# Patient Record
Sex: Female | Born: 1975 | Race: White | Hispanic: No | Marital: Married | Smoking: Never smoker
Health system: Southern US, Community
[De-identification: ages and names within clinical notes are randomized; demographics above are authoritative.]

## PROBLEM LIST (undated history)

## (undated) DIAGNOSIS — F909 Attention-deficit hyperactivity disorder, unspecified type: Secondary | ICD-10-CM

## (undated) DIAGNOSIS — N3010 Interstitial cystitis (chronic) without hematuria: Secondary | ICD-10-CM

## (undated) DIAGNOSIS — C189 Malignant neoplasm of colon, unspecified: Secondary | ICD-10-CM

## (undated) DIAGNOSIS — J45909 Unspecified asthma, uncomplicated: Secondary | ICD-10-CM

## (undated) DIAGNOSIS — G43909 Migraine, unspecified, not intractable, without status migrainosus: Secondary | ICD-10-CM

## (undated) DIAGNOSIS — K859 Acute pancreatitis without necrosis or infection, unspecified: Secondary | ICD-10-CM

## (undated) DIAGNOSIS — Q899 Congenital malformation, unspecified: Secondary | ICD-10-CM

## (undated) DIAGNOSIS — Z8616 Personal history of COVID-19: Secondary | ICD-10-CM

## (undated) DIAGNOSIS — F329 Major depressive disorder, single episode, unspecified: Secondary | ICD-10-CM

## (undated) DIAGNOSIS — G629 Polyneuropathy, unspecified: Secondary | ICD-10-CM

## (undated) DIAGNOSIS — Z973 Presence of spectacles and contact lenses: Secondary | ICD-10-CM

## (undated) DIAGNOSIS — M5430 Sciatica, unspecified side: Secondary | ICD-10-CM

## (undated) DIAGNOSIS — T7840XA Allergy, unspecified, initial encounter: Secondary | ICD-10-CM

## (undated) DIAGNOSIS — F32A Depression, unspecified: Secondary | ICD-10-CM

## (undated) DIAGNOSIS — S92901A Unspecified fracture of right foot, initial encounter for closed fracture: Secondary | ICD-10-CM

## (undated) DIAGNOSIS — K219 Gastro-esophageal reflux disease without esophagitis: Secondary | ICD-10-CM

## (undated) HISTORY — DX: Malignant neoplasm of colon, unspecified: C18.9

## (undated) HISTORY — DX: Allergy, unspecified, initial encounter: T78.40XA

## (undated) HISTORY — DX: Interstitial cystitis (chronic) without hematuria: N30.10

## (undated) HISTORY — PX: TONSILLECTOMY AND ADENOIDECTOMY: SUR1326

## (undated) HISTORY — PX: COLON SURGERY: SHX602

## (undated) HISTORY — DX: Major depressive disorder, single episode, unspecified: F32.9

## (undated) HISTORY — DX: Migraine, unspecified, not intractable, without status migrainosus: G43.909

## (undated) HISTORY — DX: Depression, unspecified: F32.A

## (undated) HISTORY — PX: OTHER SURGICAL HISTORY: SHX169

## (undated) HISTORY — PX: FOOT SURGERY: SHX648

## (undated) HISTORY — DX: Unspecified asthma, uncomplicated: J45.909

---

## 1998-09-10 HISTORY — PX: UPPER GI ENDOSCOPY: SHX6162

## 2003-09-11 DIAGNOSIS — C189 Malignant neoplasm of colon, unspecified: Secondary | ICD-10-CM

## 2003-09-11 HISTORY — PX: OTHER SURGICAL HISTORY: SHX169

## 2003-09-11 HISTORY — DX: Malignant neoplasm of colon, unspecified: C18.9

## 2004-09-10 HISTORY — PX: COLON SURGERY: SHX602

## 2012-02-25 DIAGNOSIS — Z85048 Personal history of other malignant neoplasm of rectum, rectosigmoid junction, and anus: Secondary | ICD-10-CM | POA: Insufficient documentation

## 2012-02-25 DIAGNOSIS — R935 Abnormal findings on diagnostic imaging of other abdominal regions, including retroperitoneum: Secondary | ICD-10-CM | POA: Insufficient documentation

## 2012-05-03 ENCOUNTER — Ambulatory Visit (INDEPENDENT_AMBULATORY_CARE_PROVIDER_SITE_OTHER): Payer: BC Managed Care – PPO | Admitting: Emergency Medicine

## 2012-05-03 VITALS — BP 110/74 | HR 72 | Temp 97.9°F | Resp 16 | Ht 70.0 in | Wt 152.0 lb

## 2012-05-03 DIAGNOSIS — G43909 Migraine, unspecified, not intractable, without status migrainosus: Secondary | ICD-10-CM

## 2012-05-03 MED ORDER — TRAMADOL HCL 50 MG PO TABS: 50.0000 mg | ORAL_TABLET | Freq: Four times a day (QID) | ORAL | Status: DC | PRN

## 2012-05-03 MED ORDER — ESTRADIOL-NORETHINDRONE ACET 0.05-0.14 MG/DAY TD PTTW: 1.0000 | MEDICATED_PATCH | TRANSDERMAL | Status: DC

## 2012-05-03 NOTE — Progress Notes (Deleted)
  Subjective:    Patient ID: Zoe Briggs, female    DOB: Jul 29, 1976, 36 y.o.   MRN: [id number]  HPI    Review of Systems     Objective:   Physical Exam        Assessment & Plan:

## 2012-05-03 NOTE — Progress Notes (Signed)
   Date:  05/03/2012   Name:  Zoe Briggs   DOB:  [birth year]   MRN:  [id number] Gender: female  Age: 36 y.o.  PCP:  No primary provider on file.    Chief Complaint: Medication Refill and Migraine   History of Present Illness:  Zoe Briggs is a 36 y.o. pleasant patient who presents with the following:  Patient moved here from [place] and needs to refill her meds.  Has history of multiple allergies and treatment with immunotherapy and requests a referral.  Also chronic migraine history and takes a tramadol daily as suppressant.  Out of medication.  Cannot take triptans due allergy.  Denies other complaint.  There is no problem list on file for this patient.   Past Medical History  Diagnosis Date  . Cancer     Past Surgical History  Procedure Date  . Colon surgery     History  Substance Use Topics  . Smoking status: Former Smoker    Quit date: 09/10/1992  . Smokeless tobacco: Not on file  . Alcohol Use: Not on file    No family history on file.  Allergies  Allergen Reactions  . Imitrex (Sumatriptan) Rash  . Iodinated Diagnostic Agents Rash    Medication list has been reviewed and updated.  Current Outpatient Prescriptions on File Prior to Visit  Medication Sig Dispense Refill  . cetirizine (ZYRTEC) 10 MG tablet Take 10 mg by mouth daily.      . citalopram (CELEXA) 20 MG tablet Take 20 mg by mouth daily.      Marland Kitchen estradiol-norethindrone (COMBIPATCH) 0.05-0.14 MG/DAY Place 1 patch onto the skin 2 (two) times a week.  8 patch  12    Review of Systems:  As per HPI, otherwise negative.    Physical Examination: Filed Vitals:   05/03/12 1451  BP: 110/74  Pulse: 72  Temp: 97.9 F (36.6 C)  Resp: 16   Filed Vitals:   05/03/12 1451  Height: 5\' 10"  (1.778 m)  Weight: 152 lb (68.947 kg)   Body mass index is 21.81 kg/(m^2). Ideal Body Weight: Weight in (lb) to have BMI = 25: 173.9    GEN: WDWN, NAD, Non-toxic, Alert & Oriented x 3 HEENT: Atraumatic,  Normocephalic.  Ears and Nose: No external deformity. EXTR: No clubbing/cyanosis/edema NEURO: Normal gait.  PSYCH: Normally interactive. Conversant. Not depressed or anxious appearing.  Calm demeanor.    Assessment and Plan: Migraine headaches Seasonal allergic rhinitis History of Stage 4 colon cancer with colostomy. Appropriate referrals   Carmelina Dane, MD

## 2012-05-27 ENCOUNTER — Ambulatory Visit (INDEPENDENT_AMBULATORY_CARE_PROVIDER_SITE_OTHER): Payer: BC Managed Care – PPO | Admitting: Family Medicine

## 2012-05-27 ENCOUNTER — Ambulatory Visit: Payer: Self-pay | Admitting: Family Medicine

## 2012-05-27 ENCOUNTER — Encounter: Payer: Self-pay | Admitting: Family Medicine

## 2012-05-27 VITALS — BP 104/62 | HR 76 | Temp 98.3°F | Ht 70.0 in | Wt 151.8 lb

## 2012-05-27 DIAGNOSIS — J309 Allergic rhinitis, unspecified: Secondary | ICD-10-CM

## 2012-05-27 DIAGNOSIS — G43909 Migraine, unspecified, not intractable, without status migrainosus: Secondary | ICD-10-CM

## 2012-05-27 DIAGNOSIS — Z0000 Encounter for general adult medical examination without abnormal findings: Secondary | ICD-10-CM

## 2012-05-27 DIAGNOSIS — F329 Major depressive disorder, single episode, unspecified: Secondary | ICD-10-CM

## 2012-05-27 DIAGNOSIS — F3289 Other specified depressive episodes: Secondary | ICD-10-CM

## 2012-05-27 DIAGNOSIS — J302 Other seasonal allergic rhinitis: Secondary | ICD-10-CM | POA: Insufficient documentation

## 2012-05-27 DIAGNOSIS — J45909 Unspecified asthma, uncomplicated: Secondary | ICD-10-CM

## 2012-05-27 DIAGNOSIS — C189 Malignant neoplasm of colon, unspecified: Secondary | ICD-10-CM

## 2012-05-27 DIAGNOSIS — Z780 Asymptomatic menopausal state: Secondary | ICD-10-CM

## 2012-05-27 DIAGNOSIS — N950 Postmenopausal bleeding: Secondary | ICD-10-CM

## 2012-05-27 DIAGNOSIS — F32A Depression, unspecified: Secondary | ICD-10-CM | POA: Insufficient documentation

## 2012-05-27 MED ORDER — CITALOPRAM HYDROBROMIDE 20 MG PO TABS: 20.0000 mg | ORAL_TABLET | Freq: Every day | ORAL | Status: DC

## 2012-05-27 MED ORDER — TRAMADOL HCL 50 MG PO TABS: 50.0000 mg | ORAL_TABLET | Freq: Four times a day (QID) | ORAL | Status: DC | PRN

## 2012-05-27 NOTE — Assessment & Plan Note (Signed)
Pt has f/u with [facility]

## 2012-05-27 NOTE — Assessment & Plan Note (Signed)
Pt has not had problem with asthma

## 2012-05-27 NOTE — Assessment & Plan Note (Signed)
Cont meds Pt requesting allergy/asthma referral

## 2012-05-27 NOTE — Assessment & Plan Note (Signed)
Stable. Refill meds

## 2012-05-27 NOTE — Patient Instructions (Addendum)
Preventive Care for Adults, Female A healthy lifestyle and preventive care can promote health and wellness. Preventive health guidelines for women include the following key practices.  A routine yearly physical is a good way to check with your caregiver about your health and preventive screening. It is a chance to share any concerns and updates on your health, and to receive a thorough exam.   Visit your dentist for a routine exam and preventive care every 6 months. Brush your teeth twice a day and floss once a day. Good oral hygiene prevents tooth decay and gum disease.   The frequency of eye exams is based on your age, health, family medical history, use of contact lenses, and other factors. Follow your caregiver's recommendations for frequency of eye exams.   Eat a healthy diet. Foods like vegetables, fruits, whole grains, low-fat dairy products, and lean protein foods contain the nutrients you need without too many calories. Decrease your intake of foods high in solid fats, added sugars, and salt. Eat the right amount of calories for you.Get information about a proper diet from your caregiver, if necessary.   Regular physical exercise is one of the most important things you can do for your health. Most adults should get at least 150 minutes of moderate-intensity exercise (any activity that increases your heart rate and causes you to sweat) each week. In addition, most adults need muscle-strengthening exercises on 2 or more days a week.   Maintain a healthy weight. The body mass index (BMI) is a screening tool to identify possible weight problems. It provides an estimate of body fat based on height and weight. Your caregiver can help determine your BMI, and can help you achieve or maintain a healthy weight.For adults 20 years and older:   A BMI below 18.5 is considered underweight.   A BMI of 18.5 to 24.9 is normal.   A BMI of 25 to 29.9 is considered overweight.   A BMI of 30 and above is  considered obese.   Maintain normal blood lipids and cholesterol levels by exercising and minimizing your intake of saturated fat. Eat a balanced diet with plenty of fruit and vegetables. Blood tests for lipids and cholesterol should begin at age 20 and be repeated every 5 years. If your lipid or cholesterol levels are high, you are over 50, or you are at high risk for heart disease, you may need your cholesterol levels checked more frequently.Ongoing high lipid and cholesterol levels should be treated with medicines if diet and exercise are not effective.   If you smoke, find out from your caregiver how to quit. If you do not use tobacco, do not start.   If you are pregnant, do not drink alcohol. If you are breastfeeding, be very cautious about drinking alcohol. If you are not pregnant and choose to drink alcohol, do not exceed 1 drink per day. One drink is considered to be 12 ounces (355 mL) of beer, 5 ounces (148 mL) of wine, or 1.5 ounces (44 mL) of liquor.   Avoid use of street drugs. Do not share needles with anyone. Ask for help if you need support or instructions about stopping the use of drugs.   High blood pressure causes heart disease and increases the risk of stroke. Your blood pressure should be checked at least every 1 to 2 years. Ongoing high blood pressure should be treated with medicines if weight loss and exercise are not effective.   If you are 55 to 38   years old, ask your caregiver if you should take aspirin to prevent strokes.   Diabetes screening involves taking a blood sample to check your fasting blood sugar level. This should be done once every 3 years, after age 45, if you are within normal weight and without risk factors for diabetes. Testing should be considered at a younger age or be carried out more frequently if you are overweight and have at least 1 risk factor for diabetes.   Breast cancer screening is essential preventive care for women. You should practice "breast  self-awareness." This means understanding the normal appearance and feel of your breasts and may include breast self-examination. Any changes detected, no matter how small, should be reported to a caregiver. Women in their 20s and 30s should have a clinical breast exam (CBE) by a caregiver as part of a regular health exam every 1 to 3 years. After age 40, women should have a CBE every year. Starting at age 40, women should consider having a mammography (breast X-ray test) every year. Women who have a family history of breast cancer should talk to their caregiver about genetic screening. Women at a high risk of breast cancer should talk to their caregivers about having magnetic resonance imaging (MRI) and a mammography every year.   The Pap test is a screening test for cervical cancer. A Pap test can show cell changes on the cervix that might become cervical cancer if left untreated. A Pap test is a procedure in which cells are obtained and examined from the lower end of the uterus (cervix).   Women should have a Pap test starting at age 21.   Between ages 21 and 29, Pap tests should be repeated every 2 years.   Beginning at age 30, you should have a Pap test every 3 years as long as the past 3 Pap tests have been normal.   Some women have medical problems that increase the chance of getting cervical cancer. Talk to your caregiver about these problems. It is especially important to talk to your caregiver if a new problem develops soon after your last Pap test. In these cases, your caregiver may recommend more frequent screening and Pap tests.   The above recommendations are the same for women who have or have not gotten the vaccine for human papillomavirus (HPV).   If you had a hysterectomy for a problem that was not cancer or a condition that could lead to cancer, then you no longer need Pap tests. Even if you no longer need a Pap test, a regular exam is a good idea to make sure no other problems are  starting.   If you are between ages 65 and 70, and you have had normal Pap tests going back 10 years, you no longer need Pap tests. Even if you no longer need a Pap test, a regular exam is a good idea to make sure no other problems are starting.   If you have had past treatment for cervical cancer or a condition that could lead to cancer, you need Pap tests and screening for cancer for at least 20 years after your treatment.   If Pap tests have been discontinued, risk factors (such as a new sexual partner) need to be reassessed to determine if screening should be resumed.   The HPV test is an additional test that may be used for cervical cancer screening. The HPV test looks for the virus that can cause the cell changes on the cervix.   The cells collected during the Pap test can be tested for HPV. The HPV test could be used to screen women aged 30 years and older, and should be used in women of any age who have unclear Pap test results. After the age of 30, women should have HPV testing at the same frequency as a Pap test.   Colorectal cancer can be detected and often prevented. Most routine colorectal cancer screening begins at the age of 50 and continues through age 75. However, your caregiver may recommend screening at an earlier age if you have risk factors for colon cancer. On a yearly basis, your caregiver may provide home test kits to check for hidden blood in the stool. Use of a small camera at the end of a tube, to directly examine the colon (sigmoidoscopy or colonoscopy), can detect the earliest forms of colorectal cancer. Talk to your caregiver about this at age 50, when routine screening begins. Direct examination of the colon should be repeated every 5 to 10 years through age 75, unless early forms of pre-cancerous polyps or small growths are found.   Hepatitis C blood testing is recommended for all people born from 1945 through 1965 and any individual with known risks for hepatitis C.    Practice safe sex. Use condoms and avoid high-risk sexual practices to reduce the spread of sexually transmitted infections (STIs). STIs include gonorrhea, chlamydia, syphilis, trichomonas, herpes, HPV, and human immunodeficiency virus (HIV). Herpes, HIV, and HPV are viral illnesses that have no cure. They can result in disability, cancer, and death. Sexually active women aged 25 and younger should be checked for chlamydia. Older women with new or multiple partners should also be tested for chlamydia. Testing for other STIs is recommended if you are sexually active and at increased risk.   Osteoporosis is a disease in which the bones lose minerals and strength with aging. This can result in serious bone fractures. The risk of osteoporosis can be identified using a bone density scan. Women ages 65 and over and women at risk for fractures or osteoporosis should discuss screening with their caregivers. Ask your caregiver whether you should take a calcium supplement or vitamin D to reduce the rate of osteoporosis.   Menopause can be associated with physical symptoms and risks. Hormone replacement therapy is available to decrease symptoms and risks. You should talk to your caregiver about whether hormone replacement therapy is right for you.   Use sunscreen with sun protection factor (SPF) of 30 or more. Apply sunscreen liberally and repeatedly throughout the day. You should seek shade when your shadow is shorter than you. Protect yourself by wearing long sleeves, pants, a wide-brimmed hat, and sunglasses year round, whenever you are outdoors.   Once a month, do a whole body skin exam, using a mirror to look at the skin on your back. Notify your caregiver of new moles, moles that have irregular borders, moles that are larger than a pencil eraser, or moles that have changed in shape or color.   Stay current with required immunizations.   Influenza. You need a dose every fall (or winter). The composition of  the flu vaccine changes each year, so being vaccinated once is not enough.   Pneumococcal polysaccharide. You need 1 to 2 doses if you smoke cigarettes or if you have certain chronic medical conditions. You need 1 dose at age 65 (or older) if you have never been vaccinated.   Tetanus, diphtheria, pertussis (Tdap, Td). Get 1 dose of   Tdap vaccine if you are younger than age 65, are over 65 and have contact with an infant, are a healthcare worker, are pregnant, or simply want to be protected from whooping cough. After that, you need a Td booster dose every 10 years. Consult your caregiver if you have not had at least 3 tetanus and diphtheria-containing shots sometime in your life or have a deep or dirty wound.   HPV. You need this vaccine if you are a woman age 26 or younger. The vaccine is given in 3 doses over 6 months.   Measles, mumps, rubella (MMR). You need at least 1 dose of MMR if you were born in 1957 or later. You may also need a second dose.   Meningococcal. If you are age 19 to 21 and a first-year college student living in a residence hall, or have one of several medical conditions, you need to get vaccinated against meningococcal disease. You may also need additional booster doses.   Zoster (shingles). If you are age 60 or older, you should get this vaccine.   Varicella (chickenpox). If you have never had chickenpox or you were vaccinated but received only 1 dose, talk to your caregiver to find out if you need this vaccine.   Hepatitis A. You need this vaccine if you have a specific risk factor for hepatitis A virus infection or you simply wish to be protected from this disease. The vaccine is usually given as 2 doses, 6 to 18 months apart.   Hepatitis B. You need this vaccine if you have a specific risk factor for hepatitis B virus infection or you simply wish to be protected from this disease. The vaccine is given in 3 doses, usually over 6 months.  Preventive Services /  Frequency Ages 19 to 39  Blood pressure check.** / Every 1 to 2 years.   Lipid and cholesterol check.** / Every 5 years beginning at age 20.   Clinical breast exam.** / Every 3 years for women in their 20s and 30s.   Pap test.** / Every 2 years from ages 21 through 29. Every 3 years starting at age 30 through age 65 or 70 with a history of 3 consecutive normal Pap tests.   HPV screening.** / Every 3 years from ages 30 through ages 65 to 70 with a history of 3 consecutive normal Pap tests.   Hepatitis C blood test.** / For any individual with known risks for hepatitis C.   Skin self-exam. / Monthly.   Influenza immunization.** / Every year.   Pneumococcal polysaccharide immunization.** / 1 to 2 doses if you smoke cigarettes or if you have certain chronic medical conditions.   Tetanus, diphtheria, pertussis (Tdap, Td) immunization. / A one-time dose of Tdap vaccine. After that, you need a Td booster dose every 10 years.   HPV immunization. / 3 doses over 6 months, if you are 26 and younger.   Measles, mumps, rubella (MMR) immunization. / You need at least 1 dose of MMR if you were born in 1957 or later. You may also need a second dose.   Meningococcal immunization. / 1 dose if you are age 19 to 21 and a first-year college student living in a residence hall, or have one of several medical conditions, you need to get vaccinated against meningococcal disease. You may also need additional booster doses.   Varicella immunization.** / Consult your caregiver.   Hepatitis A immunization.** / Consult your caregiver. 2 doses, 6 to 18 months   apart.   Hepatitis B immunization.** / Consult your caregiver. 3 doses usually over 6 months.  Ages 40 to 64  Blood pressure check.** / Every 1 to 2 years.   Lipid and cholesterol check.** / Every 5 years beginning at age 20.   Clinical breast exam.** / Every year after age 40.   Mammogram.** / Every year beginning at age 40 and continuing for as  long as you are in good health. Consult with your caregiver.   Pap test.** / Every 3 years starting at age 30 through age 65 or 70 with a history of 3 consecutive normal Pap tests.   HPV screening.** / Every 3 years from ages 30 through ages 65 to 70 with a history of 3 consecutive normal Pap tests.   Fecal occult blood test (FOBT) of stool. / Every year beginning at age 50 and continuing until age 75. You may not need to do this test if you get a colonoscopy every 10 years.   Flexible sigmoidoscopy or colonoscopy.** / Every 5 years for a flexible sigmoidoscopy or every 10 years for a colonoscopy beginning at age 50 and continuing until age 75.   Hepatitis C blood test.** / For all people born from 1945 through 1965 and any individual with known risks for hepatitis C.   Skin self-exam. / Monthly.   Influenza immunization.** / Every year.   Pneumococcal polysaccharide immunization.** / 1 to 2 doses if you smoke cigarettes or if you have certain chronic medical conditions.   Tetanus, diphtheria, pertussis (Tdap, Td) immunization.** / A one-time dose of Tdap vaccine. After that, you need a Td booster dose every 10 years.   Measles, mumps, rubella (MMR) immunization. / You need at least 1 dose of MMR if you were born in 1957 or later. You may also need a second dose.   Varicella immunization.** / Consult your caregiver.   Meningococcal immunization.** / Consult your caregiver.   Hepatitis A immunization.** / Consult your caregiver. 2 doses, 6 to 18 months apart.   Hepatitis B immunization.** / Consult your caregiver. 3 doses, usually over 6 months.  Ages 65 and over  Blood pressure check.** / Every 1 to 2 years.   Lipid and cholesterol check.** / Every 5 years beginning at age 20.   Clinical breast exam.** / Every year after age 40.   Mammogram.** / Every year beginning at age 40 and continuing for as long as you are in good health. Consult with your caregiver.   Pap test.** /  Every 3 years starting at age 30 through age 65 or 70 with a 3 consecutive normal Pap tests. Testing can be stopped between 65 and 70 with 3 consecutive normal Pap tests and no abnormal Pap or HPV tests in the past 10 years.   HPV screening.** / Every 3 years from ages 30 through ages 65 or 70 with a history of 3 consecutive normal Pap tests. Testing can be stopped between 65 and 70 with 3 consecutive normal Pap tests and no abnormal Pap or HPV tests in the past 10 years.   Fecal occult blood test (FOBT) of stool. / Every year beginning at age 50 and continuing until age 75. You may not need to do this test if you get a colonoscopy every 10 years.   Flexible sigmoidoscopy or colonoscopy.** / Every 5 years for a flexible sigmoidoscopy or every 10 years for a colonoscopy beginning at age 50 and continuing until age 75.   Hepatitis   C blood test.** / For all people born from 1945 through 1965 and any individual with known risks for hepatitis C.   Osteoporosis screening.** / A one-time screening for women ages 65 and over and women at risk for fractures or osteoporosis.   Skin self-exam. / Monthly.   Influenza immunization.** / Every year.   Pneumococcal polysaccharide immunization.** / 1 dose at age 65 (or older) if you have never been vaccinated.   Tetanus, diphtheria, pertussis (Tdap, Td) immunization. / A one-time dose of Tdap vaccine if you are over 65 and have contact with an infant, are a healthcare worker, or simply want to be protected from whooping cough. After that, you need a Td booster dose every 10 years.   Varicella immunization.** / Consult your caregiver.   Meningococcal immunization.** / Consult your caregiver.   Hepatitis A immunization.** / Consult your caregiver. 2 doses, 6 to 18 months apart.   Hepatitis B immunization.** / Check with your caregiver. 3 doses, usually over 6 months.  ** Family history and personal history of risk and conditions may change your caregiver's  recommendations. Document Released: 10/23/2001 Document Revised: 08/16/2011 Document Reviewed: 01/22/2011 [employer] Patient Information 2012 [employer]. 

## 2012-05-27 NOTE — Progress Notes (Signed)
  Subjective:     Zoe Briggs is a 36 y.o. female and is here for a comprehensive physical exam. The patient reports no problems.  History   Social History  . Marital Status: Married    Spouse Name: N/A    Number of Children: 1  . Years of Education: N/A   Occupational History  . [employer]     Social History Main Topics  . Smoking status: Never Smoker   . Smokeless tobacco: Never Used  . Alcohol Use: Yes     Occ  . Drug Use: No  . Sexually Active: Yes -- Female partner(s)   Other Topics Concern  . Not on file   Social History Narrative   Exercise-- 4 days a week   Health Maintenance  Topic Date Due  . Pap Smear  09/11/2011  . Influenza Vaccine  05/27/2012  . Tetanus/tdap  09/10/2014    The following portions of the patient's history were reviewed and updated as appropriate: allergies, current medications, past family history, past medical history, past social history, past surgical history and problem list.  Review of Systems Review of Systems  Constitutional: Negative for activity change, appetite change and fatigue.  HENT: Negative for hearing loss, congestion, tinnitus and ear discharge.  dentist q78m Eyes: Negative for visual disturbance (see optho q1y -- vision corrected to 20/20 with glasses).  Respiratory: Negative for cough, chest tightness and shortness of breath.   Cardiovascular: Negative for chest pain, palpitations and leg swelling.  Gastrointestinal: Negative for abdominal pain, diarrhea, constipation and abdominal distention.  Genitourinary: Negative for urgency, frequency, decreased urine volume and difficulty urinating.  Musculoskeletal: Negative for back pain, arthralgias and gait problem.  Skin: Negative for color change, pallor and rash.  Neurological: Negative for dizziness, light-headedness, numbness and headaches.  Hematological: Negative for adenopathy. Does not bruise/bleed easily.  Psychiatric/Behavioral: Negative for suicidal ideas,  confusion, sleep disturbance, self-injury, dysphoric mood, decreased concentration and agitation.       Objective:    BP 104/62  Pulse 76  Temp 98.3 F (36.8 C) (Oral)  Ht 5\' 10"  (1.778 m)  Wt 151 lb 12.8 oz (68.856 kg)  BMI 21.78 kg/m2  SpO2 97% General appearance: alert, cooperative, appears stated age and no distress Head: Normocephalic, without obvious abnormality, atraumatic Eyes: conjunctivae/corneas clear. PERRL, EOM's intact. Fundi benign. Ears: normal TM's and external ear canals both ears Nose: Nares normal. Septum midline. Mucosa normal. No drainage or sinus tenderness. Throat: lips, mucosa, and tongue normal; teeth and gums normal Neck: no adenopathy, no carotid bruit, supple, symmetrical, trachea midline and thyroid not enlarged, symmetric, no tenderness/mass/nodules Back: symmetric, no curvature. ROM normal. No CVA tenderness. Lungs: clear to auscultation bilaterally Breasts: normal appearance, no masses or tenderness Heart: regular rate and rhythm, S1, S2 normal, no murmur, click, rub or gallop Abdomen: soft, non-tender; bowel sounds normal; no masses,  no organomegaly Pelvic: deferred--gyn Extremities: extremities normal, atraumatic, no cyanosis or edema Pulses: 2+ and symmetric Skin: Skin color, texture, turgor normal. No rashes or lesions Lymph nodes: Cervical, supraclavicular, and axillary nodes normal. Neurologic: Alert and oriented X 3, normal strength and tone. Normal symmetric reflexes. Normal coordination and gait    Assessment:    Healthy female exam.      Plan:  ghm utd Check labs   See After Visit Summary for Counseling Recommendations

## 2012-05-30 ENCOUNTER — Other Ambulatory Visit: Payer: BC Managed Care – PPO

## 2012-06-02 ENCOUNTER — Other Ambulatory Visit: Payer: BC Managed Care – PPO

## 2012-06-03 ENCOUNTER — Other Ambulatory Visit (INDEPENDENT_AMBULATORY_CARE_PROVIDER_SITE_OTHER): Payer: BC Managed Care – PPO

## 2012-06-03 DIAGNOSIS — Z780 Asymptomatic menopausal state: Secondary | ICD-10-CM

## 2012-06-03 DIAGNOSIS — Z0000 Encounter for general adult medical examination without abnormal findings: Secondary | ICD-10-CM

## 2012-06-03 DIAGNOSIS — Z13220 Encounter for screening for lipoid disorders: Secondary | ICD-10-CM

## 2012-06-03 LAB — URINALYSIS
Bilirubin Urine: NEGATIVE
Hgb urine dipstick: NEGATIVE
Ketones, ur: NEGATIVE
Leukocytes, UA: NEGATIVE
Nitrite: NEGATIVE
Specific Gravity, Urine: <=1.005 (ref 1.000–1.030)
Total Protein, Urine: NEGATIVE
Urine Glucose: NEGATIVE
Urobilinogen, UA: 0.2 (ref 0.0–1.0)
pH: 7 (ref 5.0–8.0)

## 2012-06-03 LAB — CBC WITH DIFFERENTIAL/PLATELET
Basophils Absolute: 0 10*3/uL (ref 0.0–0.1)
Basophils Relative: 0.3 % (ref 0.0–3.0)
Eosinophils Absolute: 0 10*3/uL (ref 0.0–0.7)
Eosinophils Relative: 0.7 % (ref 0.0–5.0)
HCT: 40.9 % (ref 36.0–46.0)
Hemoglobin: 13.4 g/dL (ref 12.0–15.0)
Lymphocytes Relative: 26.8 % (ref 12.0–46.0)
Lymphs Abs: 0.9 10*3/uL (ref 0.7–4.0)
MCHC: 32.7 g/dL (ref 30.0–36.0)
MCV: 96.4 fl (ref 78.0–100.0)
Monocytes Absolute: 0.3 10*3/uL (ref 0.1–1.0)
Monocytes Relative: 9.1 % (ref 3.0–12.0)
Neutro Abs: 2.1 10*3/uL (ref 1.4–7.7)
Neutrophils Relative %: 63.1 % (ref 43.0–77.0)
Platelets: 155 10*3/uL (ref 150.0–400.0)
RBC: 4.24 Mil/uL (ref 3.87–5.11)
RDW: 12.4 % (ref 11.5–14.6)
WBC: 3.3 10*3/uL — ABNORMAL LOW (ref 4.5–10.5)

## 2012-06-03 NOTE — Progress Notes (Signed)
Labs only

## 2012-06-04 LAB — BASIC METABOLIC PANEL
BUN: 11 mg/dL (ref 6–23)
CO2: 26 mEq/L (ref 19–32)
Calcium: 9 mg/dL (ref 8.4–10.5)
Chloride: 106 mEq/L (ref 96–112)
Creatinine, Ser: 0.6 mg/dL (ref 0.4–1.2)
GFR: 110.44 mL/min (ref >60.00)
Glucose, Bld: 89 mg/dL (ref 70–99)
Potassium: 4.2 mEq/L (ref 3.5–5.1)
Sodium: 138 mEq/L (ref 135–145)

## 2012-06-04 LAB — HEPATIC FUNCTION PANEL
ALT: 15 U/L (ref 0–35)
AST: 19 U/L (ref 0–37)
Albumin: 4.2 g/dL (ref 3.5–5.2)
Alkaline Phosphatase: 50 U/L (ref 39–117)
Bilirubin, Direct: 0.1 mg/dL (ref 0.0–0.3)
Total Bilirubin: 0.6 mg/dL (ref 0.3–1.2)
Total Protein: 7 g/dL (ref 6.0–8.3)

## 2012-06-04 LAB — LIPID PANEL
Cholesterol: 203 mg/dL — ABNORMAL HIGH (ref 0–200)
HDL: 78.7 mg/dL (ref >39.00)
Total CHOL/HDL Ratio: 3
Triglycerides: 75 mg/dL (ref 0.0–149.0)
VLDL: 15 mg/dL (ref 0.0–40.0)

## 2012-06-04 LAB — LDL CHOLESTEROL, DIRECT: Direct LDL: 99.9 mg/dL

## 2012-06-04 LAB — TSH: TSH: 0.49 u[IU]/mL (ref 0.35–5.50)

## 2012-06-06 LAB — VITAMIN D 1,25 DIHYDROXY
Vitamin D 1, 25 (OH)2 Total: 54 pg/mL (ref 18–72)
Vitamin D2 1, 25 (OH)2: <8 pg/mL
Vitamin D3 1, 25 (OH)2: 54 pg/mL

## 2012-06-17 ENCOUNTER — Telehealth: Payer: Self-pay | Admitting: Family Medicine

## 2012-06-17 NOTE — Telephone Encounter (Signed)
msg left on VM for clarification.       KP

## 2012-06-17 NOTE — Telephone Encounter (Signed)
Pt called in and needs med to allow her to work- she would like you to call her back. She is going to work and asked that if she doesn't pick up to please leave message.

## 2012-06-18 ENCOUNTER — Other Ambulatory Visit: Payer: Self-pay | Admitting: Family Medicine

## 2012-06-18 DIAGNOSIS — Z889 Allergy status to unspecified drugs, medicaments and biological substances status: Secondary | ICD-10-CM

## 2012-06-18 MED ORDER — PREDNISONE 10 MG PO TABS: ORAL_TABLET | ORAL | Status: DC

## 2012-06-18 NOTE — Telephone Encounter (Signed)
Spoke with Dr.Lowne and she said she was seeing an allergist that we referred her to Next Wednesday, They are requesting the patient have an Rx for prednisone to replace antihistamine. I was told by Dr.Vanwinkles office the antihistamne will not affect testing. Please advise     KP

## 2012-06-18 NOTE — Telephone Encounter (Signed)
You mean the prednisone won't affect it , right---I know they are supposed to be off antihistamines for 1 week before.   Pred 10 mg taper sent to pharm on file.

## 2012-06-19 NOTE — Telephone Encounter (Signed)
VM left advising the patient Rx sent to the pharmacy.      KP

## 2012-06-25 ENCOUNTER — Encounter (HOSPITAL_COMMUNITY): Payer: Self-pay | Admitting: *Deleted

## 2012-06-25 ENCOUNTER — Emergency Department (HOSPITAL_COMMUNITY)
Admission: EM | Admit: 2012-06-25 | Discharge: 2012-06-25 | Disposition: A | Discharge status: Home / Self Care | Payer: BC Managed Care – PPO | Attending: Emergency Medicine | Admitting: Emergency Medicine

## 2012-06-25 ENCOUNTER — Emergency Department (HOSPITAL_COMMUNITY): Payer: BC Managed Care – PPO

## 2012-06-25 DIAGNOSIS — F411 Generalized anxiety disorder: Secondary | ICD-10-CM | POA: Insufficient documentation

## 2012-06-25 DIAGNOSIS — Z933 Colostomy status: Secondary | ICD-10-CM | POA: Insufficient documentation

## 2012-06-25 DIAGNOSIS — N390 Urinary tract infection, site not specified: Secondary | ICD-10-CM | POA: Insufficient documentation

## 2012-06-25 DIAGNOSIS — R109 Unspecified abdominal pain: Secondary | ICD-10-CM

## 2012-06-25 DIAGNOSIS — Z85038 Personal history of other malignant neoplasm of large intestine: Secondary | ICD-10-CM | POA: Insufficient documentation

## 2012-06-25 LAB — URINALYSIS, ROUTINE W REFLEX MICROSCOPIC
Bilirubin Urine: NEGATIVE
Glucose, UA: NEGATIVE mg/dL
Ketones, ur: NEGATIVE mg/dL
Nitrite: NEGATIVE
Protein, ur: 30 mg/dL — AB
Specific Gravity, Urine: 1.02 (ref 1.005–1.030)
Urobilinogen, UA: 0.2 mg/dL (ref 0.0–1.0)
pH: 5.5 (ref 5.0–8.0)

## 2012-06-25 LAB — CBC WITH DIFFERENTIAL/PLATELET
Basophils Absolute: 0 10*3/uL (ref 0.0–0.1)
Basophils Relative: 0 % (ref 0–1)
Eosinophils Absolute: 0 10*3/uL (ref 0.0–0.7)
Eosinophils Relative: 0 % (ref 0–5)
HCT: 37.3 % (ref 36.0–46.0)
Hemoglobin: 12.9 g/dL (ref 12.0–15.0)
Lymphocytes Relative: 10 % — ABNORMAL LOW (ref 12–46)
Lymphs Abs: 0.9 10*3/uL (ref 0.7–4.0)
MCH: 31.9 pg (ref 26.0–34.0)
MCHC: 34.6 g/dL (ref 30.0–36.0)
MCV: 92.1 fL (ref 78.0–100.0)
Monocytes Absolute: 0.8 10*3/uL (ref 0.1–1.0)
Monocytes Relative: 9 % (ref 3–12)
Neutro Abs: 7.1 10*3/uL (ref 1.7–7.7)
Neutrophils Relative %: 80 % — ABNORMAL HIGH (ref 43–77)
Platelets: 195 10*3/uL (ref 150–400)
RBC: 4.05 MIL/uL (ref 3.87–5.11)
RDW: 12.1 % (ref 11.5–15.5)
WBC: 8.8 10*3/uL (ref 4.0–10.5)

## 2012-06-25 LAB — BASIC METABOLIC PANEL
BUN: 22 mg/dL (ref 6–23)
CO2: 24 mEq/L (ref 19–32)
Calcium: 9.6 mg/dL (ref 8.4–10.5)
Chloride: 102 mEq/L (ref 96–112)
Creatinine, Ser: 0.74 mg/dL (ref 0.50–1.10)
GFR calc Af Amer: >90 mL/min (ref >90)
GFR calc non Af Amer: >90 mL/min (ref >90)
Glucose, Bld: 119 mg/dL — ABNORMAL HIGH (ref 70–99)
Potassium: 3.8 mEq/L (ref 3.5–5.1)
Sodium: 136 mEq/L (ref 135–145)

## 2012-06-25 LAB — PREGNANCY, URINE: Preg Test, Ur: NEGATIVE

## 2012-06-25 LAB — URINE MICROSCOPIC-ADD ON

## 2012-06-25 MED ORDER — HYDROMORPHONE HCL PF 1 MG/ML IJ SOLN
0.5000 mg | Freq: Once | INTRAMUSCULAR | Status: AC
  Administered 2012-06-25: 0.5 mg via INTRAVENOUS
  Filled 2012-06-25: qty 1

## 2012-06-25 MED ORDER — HYDROMORPHONE HCL PF 1 MG/ML IJ SOLN
1.0000 mg | Freq: Once | INTRAMUSCULAR | Status: AC
  Administered 2012-06-25: 1 mg via INTRAVENOUS
  Filled 2012-06-25: qty 1

## 2012-06-25 MED ORDER — FLUCONAZOLE 200 MG PO TABS: 200.0000 mg | ORAL_TABLET | Freq: Every day | ORAL | Status: AC

## 2012-06-25 MED ORDER — METHYLPREDNISOLONE SODIUM SUCC 40 MG IJ SOLR
40.0000 mg | Freq: Once | INTRAMUSCULAR | Status: AC
  Administered 2012-06-25: 40 mg via INTRAVENOUS
  Filled 2012-06-25: qty 1

## 2012-06-25 MED ORDER — ONDANSETRON 4 MG PO TBDP: 4.0000 mg | ORAL_TABLET | Freq: Three times a day (TID) | ORAL | Status: DC | PRN

## 2012-06-25 MED ORDER — SODIUM CHLORIDE 0.9 % IV BOLUS (SEPSIS)
1000.0000 mL | Freq: Once | INTRAVENOUS | Status: AC
  Administered 2012-06-25: 1000 mL via INTRAVENOUS

## 2012-06-25 MED ORDER — SULFAMETHOXAZOLE-TRIMETHOPRIM 800-160 MG PO TABS: 1.0000 | ORAL_TABLET | Freq: Two times a day (BID) | ORAL | Status: DC

## 2012-06-25 MED ORDER — DIPHENHYDRAMINE HCL 50 MG/ML IJ SOLN
50.0000 mg | Freq: Once | INTRAMUSCULAR | Status: AC
  Administered 2012-06-25: 50 mg via INTRAVENOUS
  Filled 2012-06-25: qty 1

## 2012-06-25 MED ORDER — IOHEXOL 300 MG/ML  SOLN
100.0000 mL | Freq: Once | INTRAMUSCULAR | Status: AC | PRN
  Administered 2012-06-25: 100 mL via INTRAVENOUS

## 2012-06-25 MED ORDER — ONDANSETRON HCL 4 MG/2ML IJ SOLN
4.0000 mg | Freq: Once | INTRAMUSCULAR | Status: AC
  Administered 2012-06-25: 4 mg via INTRAVENOUS
  Filled 2012-06-25: qty 2

## 2012-06-25 MED ORDER — DEXTROSE 5 % IV SOLN
1.0000 g | Freq: Once | INTRAVENOUS | Status: AC
  Administered 2012-06-25: 1 g via INTRAVENOUS
  Filled 2012-06-25: qty 10

## 2012-06-25 NOTE — Discharge Instructions (Signed)
Abdominal Pain  Abdominal pain can be caused by many things. Your caregiver decides the seriousness of your pain by an examination and possibly blood tests and X-rays. Many cases can be observed and treated at home. Most abdominal pain is not caused by a disease and will probably improve without treatment. However, in many cases, more time must pass before a clear cause of the pain can be found. Before that point, it may not be known if you need more testing, or if hospitalization or surgery is needed.  HOME CARE INSTRUCTIONS    Do not take laxatives unless directed by your caregiver.   Take pain medicine only as directed by your caregiver.   Only take over-the-counter or prescription medicines for pain, discomfort, or fever as directed by your caregiver.   Try a clear liquid diet (broth, tea, or water) for as long as directed by your caregiver. Slowly move to a bland diet as tolerated.  SEEK IMMEDIATE MEDICAL CARE IF:    The pain does not go away.   You have a fever.   You keep throwing up (vomiting).   The pain is felt only in portions of the abdomen. Pain in the right side could possibly be appendicitis. In an adult, pain in the left lower portion of the abdomen could be colitis or diverticulitis.   You pass bloody or black tarry stools.  MAKE SURE YOU:    Understand these instructions.   Will watch your condition.   Will get help right away if you are not doing well or get worse.  Document Released: 06/06/2005 Document Revised: 11/19/2011 Document Reviewed: 04/14/2008  ExitCare Patient Information 2013 [employer]  Urinary Tract Infection  Urinary tract infections (UTIs) can develop anywhere along your urinary tract. Your urinary tract is your body's drainage system for removing wastes and extra water. Your urinary tract includes two kidneys, two ureters, a bladder, and a urethra. Your kidneys are a pair of bean-shaped organs. Each kidney is about the size of your fist. They are located below  your ribs, one on each side of your spine.  CAUSES  Infections are caused by microbes, which are microscopic organisms, including fungi, viruses, and bacteria. These organisms are so small that they can only be seen through a microscope. Bacteria are the microbes that most commonly cause UTIs.  SYMPTOMS   Symptoms of UTIs may vary by age and gender of the patient and by the location of the infection. Symptoms in young women typically include a frequent and intense urge to urinate and a painful, burning feeling in the bladder or urethra during urination. Older women and men are more likely to be tired, shaky, and weak and have muscle aches and abdominal pain. A fever may mean the infection is in your kidneys. Other symptoms of a kidney infection include pain in your back or sides below the ribs, nausea, and vomiting.  DIAGNOSIS  To diagnose a UTI, your caregiver will ask you about your symptoms. Your caregiver also will ask to provide a urine sample. The urine sample will be tested for bacteria and white blood cells. White blood cells are made by your body to help fight infection.  TREATMENT   Typically, UTIs can be treated with medication. Because most UTIs are caused by a bacterial infection, they usually can be treated with the use of antibiotics. The choice of antibiotic and length of treatment depend on your symptoms and the type of bacteria causing your infection.  HOME CARE INSTRUCTIONS     If you were prescribed antibiotics, take them exactly as your caregiver instructs you. Finish the medication even if you feel better after you have only taken some of the medication.   Drink enough water and fluids to keep your urine clear or pale yellow.   Avoid caffeine, tea, and carbonated beverages. They tend to irritate your bladder.   Empty your bladder often. Avoid holding urine for long periods of time.   Empty your bladder before and after sexual intercourse.   After a bowel movement, women should cleanse from  front to back. Use each tissue only once.  SEEK MEDICAL CARE IF:    You have back pain.   You develop a fever.   Your symptoms do not begin to resolve within 3 days.  SEEK IMMEDIATE MEDICAL CARE IF:    You have severe back pain or lower abdominal pain.   You develop chills.   You have nausea or vomiting.   You have continued burning or discomfort with urination.  MAKE SURE YOU:    Understand these instructions.   Will watch your condition.   Will get help right away if you are not doing well or get worse.  Document Released: 06/06/2005 Document Revised: 02/26/2012 Document Reviewed: 10/05/2011  Exit[employer] Patient Information 2013 [employer]

## 2012-06-25 NOTE — ED Notes (Signed)
Pt c/o bilateral flank pain starting at midnight; lower abd pain; vomiting on arrival

## 2012-06-25 NOTE — ED Provider Notes (Signed)
Results for orders placed during the hospital encounter of 06/25/12  CBC WITH DIFFERENTIAL      Component Value Range   WBC 8.8  4.0 - 10.5 K/uL   RBC 4.05  3.87 - 5.11 MIL/uL   Hemoglobin 12.9  12.0 - 15.0 g/dL   HCT 16.1  09.6 - 04.5 %   MCV 92.1  78.0 - 100.0 fL   MCH 31.9  26.0 - 34.0 pg   MCHC 34.6  30.0 - 36.0 g/dL   RDW 40.9  81.1 - 91.4 %   Platelets 195  150 - 400 K/uL   Neutrophils Relative 80 (*) 43 - 77 %   Neutro Abs 7.1  1.7 - 7.7 K/uL   Lymphocytes Relative 10 (*) 12 - 46 %   Lymphs Abs 0.9  0.7 - 4.0 K/uL   Monocytes Relative 9  3 - 12 %   Monocytes Absolute 0.8  0.1 - 1.0 K/uL   Eosinophils Relative 0  0 - 5 %   Eosinophils Absolute 0.0  0.0 - 0.7 K/uL   Basophils Relative 0  0 - 1 %   Basophils Absolute 0.0  0.0 - 0.1 K/uL  BASIC METABOLIC PANEL      Component Value Range   Sodium 136  135 - 145 mEq/L   Potassium 3.8  3.5 - 5.1 mEq/L   Chloride 102  96 - 112 mEq/L   CO2 24  19 - 32 mEq/L   Glucose, Bld 119 (*) 70 - 99 mg/dL   BUN 22  6 - 23 mg/dL   Creatinine, Ser 7.82  0.50 - 1.10 mg/dL   Calcium 9.6  8.4 - 95.6 mg/dL   GFR calc non Af Amer >90  >90 mL/min   GFR calc Af Amer >90  >90 mL/min  URINALYSIS, ROUTINE W REFLEX MICROSCOPIC      Component Value Range   Color, Urine YELLOW  YELLOW   APPearance CLOUDY (*) CLEAR   Specific Gravity, Urine 1.020  1.005 - 1.030   pH 5.5  5.0 - 8.0   Glucose, UA NEGATIVE  NEGATIVE mg/dL   Hgb urine dipstick SMALL (*) NEGATIVE   Bilirubin Urine NEGATIVE  NEGATIVE   Ketones, ur NEGATIVE  NEGATIVE mg/dL   Protein, ur 30 (*) NEGATIVE mg/dL   Urobilinogen, UA 0.2  0.0 - 1.0 mg/dL   Nitrite NEGATIVE  NEGATIVE   Leukocytes, UA LARGE (*) NEGATIVE  PREGNANCY, URINE      Component Value Range   Preg Test, Ur NEGATIVE  NEGATIVE  URINE MICROSCOPIC-ADD ON      Component Value Range   Squamous Epithelial / LPF RARE  RARE   WBC, UA 21-50  <3 WBC/hpf   RBC / HPF 3-6  <3 RBC/hpf   Bacteria, UA MANY (*) RARE   Urine-Other  LESS THAN 10 mL OF URINE SUBMITTED     Ct Abdomen Pelvis W Contrast  06/25/2012  *RADIOLOGY REPORT*  Clinical Data: Bilateral flank/lower abdominal pain, nausea/vomiting, history of colon cancer status post resection and chemotherapy in 2006, history of interstitial cystitis  CT ABDOMEN AND PELVIS WITH CONTRAST  Technique:  Multidetector CT imaging of the abdomen and pelvis was performed following the standard protocol during bolus administration of intravenous contrast.  Contrast: OMNIPAQUE IOHEXOL 300 MG/ML  SOLN  Comparison: None.  Findings: Lung bases are clear.  Liver, spleen, pancreas, and adrenal glands are within normal limits.  Gallbladder is unremarkable.  No intrahepatic or extrahepatic ductal dilatation.  Kidneys are within normal limits.  No hydronephrosis.  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  No evidence of bowel obstruction.  Normal appendix.  No evidence of abdominal aortic aneurysm.  No abdominopelvic ascites.  No suspicious abdominopelvic lymphadenopathy.  Uterus and bilateral ovaries are unremarkable.  The bladder is within normal limits.  Visualized osseous structures are within normal limits.  IMPRESSION: No evidence of bowel obstruction.  Normal appendix.  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  No CT findings to account for the patient's abdominal pain.   Original Report Authenticated By: Charline Bills, M.D.     Pt's pain is much improved.  Still mildly tender over lower abdomen.  CT does not show any obstruction, new metastatic dz, infection, kidney stone.  Given IV rocephin, urine culture sent.  Will start on bactrim, zofran.  Advised pt to f/u with her PMD in 1-2 days for a recheck or return here if her symptoms worsen  Rolan Bucco, MD 06/25/12 [phone]

## 2012-06-25 NOTE — ED Provider Notes (Signed)
History     CSN: [id number]  Arrival date & time 06/25/12  7829   First MD Initiated Contact with Patient 06/25/12 [phone]      Chief Complaint  Patient presents with  . Abdominal Pain    (Consider location/radiation/quality/duration/timing/severity/associated sxs/prior treatment) HPI Is a 36 year old white female who awoke about midnight with abdominal pain, primarily in the suprapubic region but radiating to the entire abdomen and bilateral flanks. The pain is severe and worse when lying supine. It is improved sitting up and leaning forward. She has been persistently retching. She has a colostomy and states it's output has been within normal limits. Her abdomen is not distended. She states she has not had pain like this in the past.  Past Medical History  Diagnosis Date  . Asthma     Seasonal  . Depression   . Allergy   . Migraines   . IC (interstitial cystitis)   . Colon cancer 2005    Past Surgical History  Procedure Date  . Colon surgery   . Tonsillectomy and adenoidectomy     Family History  Problem Relation Age of Onset  . Arthritis Mother   . Heart disease Mother     Thayer Jew in the heart  . Hypertension Mother   . Cancer Paternal Aunt     brain  . Cancer Paternal Grandfather     skin    History  Substance Use Topics  . Smoking status: Never Smoker   . Smokeless tobacco: Never Used  . Alcohol Use: Yes     Occ    OB History    Grav Para Term Preterm Abortions TAB SAB Ect Mult Living                  Review of Systems  All other systems reviewed and are negative.    Allergies  Imitrex and Iodinated diagnostic agents  Home Medications   Current Outpatient Rx  Name Route Sig Dispense Refill  . CETIRIZINE HCL 10 MG PO TABS Oral Take 10 mg by mouth daily.    Marland Kitchen CITALOPRAM HYDROBROMIDE 20 MG PO TABS Oral Take 1 tablet (20 mg total) by mouth daily. 90 tablet 3  . ONE-DAILY MULTI VITAMINS PO TABS Oral Take 1 tablet by mouth daily.    Marland Kitchen PENTOSAN  POLYSULFATE SODIUM 100 MG PO CAPS Oral Take 100 mg by mouth 3 (three) times daily before meals.    Marland Kitchen PREDNISONE 10 MG PO TABS Oral Take 10-30 mg by mouth daily. 3 tablets (30mg ) daily for three days. Then, 2 tablets (20mg ) daily for three days. Finally, 1 tablet (10mg ) daily for three days. Total 9 days.    . TRAMADOL HCL 50 MG PO TABS Oral Take 1 tablet (50 mg total) by mouth every 6 (six) hours as needed. 40 tablet 1    BP 102/56  Pulse 59  Temp 97.6 F (36.4 C)  Resp 20  SpO2 100%  Physical Exam General: Well-developed, well-nourished female in no acute distress; appears uncomfortable; appearance consistent with age of record HENT: normocephalic, atraumatic Eyes: pupils equal round and reactive to light; extraocular muscles intact Neck: supple Heart: regular rate and rhythm Lungs: clear to auscultation bilaterally Abdomen: soft; nondistended; generalized tenderness most prominently in the suprapubic region; bowel sounds decreased; colostomy left lower quadrant GU: Bilateral CVA tenderness Extremities: No deformity; full range of motion Neurologic: Awake, alert and oriented; motor function intact in all extremities and symmetric; no facial droop Skin: Warm and dry Psychiatric:  Anxious    ED Course  Procedures (including critical care time)     MDM   Nursing notes and vitals signs, including pulse oximetry, reviewed.  Summary of this visit's results, reviewed by myself:  Labs:  Results for orders placed during the hospital encounter of 06/25/12  CBC WITH DIFFERENTIAL      Component Value Range   WBC 8.8  4.0 - 10.5 K/uL   RBC 4.05  3.87 - 5.11 MIL/uL   Hemoglobin 12.9  12.0 - 15.0 g/dL   HCT 16.1  09.6 - 04.5 %   MCV 92.1  78.0 - 100.0 fL   MCH 31.9  26.0 - 34.0 pg   MCHC 34.6  30.0 - 36.0 g/dL   RDW 40.9  81.1 - 91.4 %   Platelets 195  150 - 400 K/uL   Neutrophils Relative 80 (*) 43 - 77 %   Neutro Abs 7.1  1.7 - 7.7 K/uL   Lymphocytes Relative 10 (*) 12 - 46  %   Lymphs Abs 0.9  0.7 - 4.0 K/uL   Monocytes Relative 9  3 - 12 %   Monocytes Absolute 0.8  0.1 - 1.0 K/uL   Eosinophils Relative 0  0 - 5 %   Eosinophils Absolute 0.0  0.0 - 0.7 K/uL   Basophils Relative 0  0 - 1 %   Basophils Absolute 0.0  0.0 - 0.1 K/uL  BASIC METABOLIC PANEL      Component Value Range   Sodium 136  135 - 145 mEq/L   Potassium 3.8  3.5 - 5.1 mEq/L   Chloride 102  96 - 112 mEq/L   CO2 24  19 - 32 mEq/L   Glucose, Bld 119 (*) 70 - 99 mg/dL   BUN 22  6 - 23 mg/dL   Creatinine, Ser 7.82  0.50 - 1.10 mg/dL   Calcium 9.6  8.4 - 95.6 mg/dL   GFR calc non Af Amer >90  >90 mL/min   GFR calc Af Amer >90  >90 mL/min  URINALYSIS, ROUTINE W REFLEX MICROSCOPIC      Component Value Range   Color, Urine YELLOW  YELLOW   APPearance CLOUDY (*) CLEAR   Specific Gravity, Urine 1.020  1.005 - 1.030   pH 5.5  5.0 - 8.0   Glucose, UA NEGATIVE  NEGATIVE mg/dL   Hgb urine dipstick SMALL (*) NEGATIVE   Bilirubin Urine NEGATIVE  NEGATIVE   Ketones, ur NEGATIVE  NEGATIVE mg/dL   Protein, ur 30 (*) NEGATIVE mg/dL   Urobilinogen, UA 0.2  0.0 - 1.0 mg/dL   Nitrite NEGATIVE  NEGATIVE   Leukocytes, UA LARGE (*) NEGATIVE  PREGNANCY, URINE      Component Value Range   Preg Test, Ur NEGATIVE  NEGATIVE  URINE MICROSCOPIC-ADD ON      Component Value Range   Squamous Epithelial / LPF RARE  RARE   WBC, UA 21-50  <3 WBC/hpf   RBC / HPF 3-6  <3 RBC/hpf   Bacteria, UA MANY (*) RARE   Urine-Other LESS THAN 10 mL OF URINE SUBMITTED      Imaging Studies: No results found.  7:52 AM CT abdomen/pelvis pending. Dr. Fredderick Phenix will make disposition based on results.        Hanley Seamen, MD 06/25/12 [phone]

## 2012-06-25 NOTE — ED Notes (Signed)
CT Abd NOT completed, acknowledged by this RN.

## 2012-06-27 LAB — URINE CULTURE: Colony Count: 30000

## 2012-07-01 ENCOUNTER — Other Ambulatory Visit: Payer: Self-pay | Admitting: Family Medicine

## 2012-07-01 NOTE — Telephone Encounter (Signed)
Seen and filled 05/27/12 #40 with 1 refill. Patient got the refill on 06/13/12. Please advise     KP

## 2012-07-01 NOTE — Telephone Encounter (Signed)
OK X1 

## 2012-07-02 ENCOUNTER — Telehealth: Payer: Self-pay

## 2012-07-02 ENCOUNTER — Other Ambulatory Visit: Payer: Self-pay | Admitting: Emergency Medicine

## 2012-07-02 NOTE — Telephone Encounter (Signed)
Rx sent pt aware.   MW  

## 2012-07-02 NOTE — Telephone Encounter (Signed)
dup

## 2012-07-08 ENCOUNTER — Ambulatory Visit (INDEPENDENT_AMBULATORY_CARE_PROVIDER_SITE_OTHER): Payer: BC Managed Care – PPO | Admitting: Family Medicine

## 2012-07-08 ENCOUNTER — Encounter: Payer: Self-pay | Admitting: Family Medicine

## 2012-07-08 VITALS — BP 100/70 | HR 85 | Temp 98.3°F | Wt 151.8 lb

## 2012-07-08 DIAGNOSIS — N390 Urinary tract infection, site not specified: Secondary | ICD-10-CM

## 2012-07-08 DIAGNOSIS — F988 Other specified behavioral and emotional disorders with onset usually occurring in childhood and adolescence: Secondary | ICD-10-CM | POA: Insufficient documentation

## 2012-07-08 LAB — POCT URINALYSIS DIPSTICK
Bilirubin, UA: NEGATIVE
Blood, UA: NEGATIVE
Glucose, UA: NEGATIVE
Ketones, UA: NEGATIVE
Nitrite, UA: NEGATIVE
Protein, UA: NEGATIVE
Spec Grav, UA: <=1.005
Urobilinogen, UA: 0.2
pH, UA: 5

## 2012-07-08 MED ORDER — METHYLPHENIDATE HCL ER (OSM) 18 MG PO TBCR: 18.0000 mg | EXTENDED_RELEASE_TABLET | ORAL | Status: DC

## 2012-07-08 MED ORDER — CIPROFLOXACIN HCL 500 MG PO TABS: 500.0000 mg | ORAL_TABLET | Freq: Two times a day (BID) | ORAL | Status: AC

## 2012-07-08 NOTE — Assessment & Plan Note (Signed)
Restart concerta

## 2012-07-08 NOTE — Progress Notes (Signed)
  Subjective:    Zoe Briggs is a 36 y.o. female who complains of nausea, pain in the lower abdomen and vomiting. She has had symptoms for several weeks. Patient also complains of back pain. Patient denies congestion, cough, fever, headache, rhinitis, sorethroat, stomach ache and vaginal discharge. Patient does not have a history of recurrent UTI. Patient does not have a history of pyelonephritis.   The following portions of the patient's history were reviewed and updated as appropriate: allergies, current medications, past family history, past medical history, past social history, past surgical history and problem list.  Review of Systems Pertinent items are noted in HPI.    Objective:    BP 100/70  Pulse 85  Temp 98.3 F (36.8 C) (Oral)  Wt 151 lb 12.8 oz (68.856 kg)  SpO2 98% Abdomen: abnormal findings:  moderate tenderness in the lower abdomen  Laboratory:  Urine dipstick: mod for leukocyte esterase.   Micro exam: not done.    Assessment:    Acute cystitis and UTI     Plan:    Medications: ciprofloxacin. Maintain adequate hydration. Follow up if symptoms not improving, and as needed.

## 2012-07-08 NOTE — Patient Instructions (Addendum)
Urinary Tract Infection Urinary tract infections (UTIs) can develop anywhere along your urinary tract. Your urinary tract is your body's drainage system for removing wastes and extra water. Your urinary tract includes two kidneys, two ureters, a bladder, and a urethra. Your kidneys are a pair of bean-shaped organs. Each kidney is about the size of your fist. They are located below your ribs, one on each side of your spine. CAUSES Infections are caused by microbes, which are microscopic organisms, including fungi, viruses, and bacteria. These organisms are so small that they can only be seen through a microscope. Bacteria are the microbes that most commonly cause UTIs. SYMPTOMS  Symptoms of UTIs may vary by age and gender of the patient and by the location of the infection. Symptoms in young women typically include a frequent and intense urge to urinate and a painful, burning feeling in the bladder or urethra during urination. Older women and men are more likely to be tired, shaky, and weak and have muscle aches and abdominal pain. A fever may mean the infection is in your kidneys. Other symptoms of a kidney infection include pain in your back or sides below the ribs, nausea, and vomiting. DIAGNOSIS To diagnose a UTI, your caregiver will ask you about your symptoms. Your caregiver also will ask to provide a urine sample. The urine sample will be tested for bacteria and white blood cells. White blood cells are made by your body to help fight infection. TREATMENT  Typically, UTIs can be treated with medication. Because most UTIs are caused by a bacterial infection, they usually can be treated with the use of antibiotics. The choice of antibiotic and length of treatment depend on your symptoms and the type of bacteria causing your infection. HOME CARE INSTRUCTIONS  If you were prescribed antibiotics, take them exactly as your caregiver instructs you. Finish the medication even if you feel better after you  have only taken some of the medication.  Drink enough water and fluids to keep your urine clear or pale yellow.  Avoid caffeine, tea, and carbonated beverages. They tend to irritate your bladder.  Empty your bladder often. Avoid holding urine for long periods of time.  Empty your bladder before and after sexual intercourse.  After a bowel movement, women should cleanse from front to back. Use each tissue only once. SEEK MEDICAL CARE IF:   You have back pain.  You develop a fever.  Your symptoms do not begin to resolve within 3 days. SEEK IMMEDIATE MEDICAL CARE IF:   You have severe back pain or lower abdominal pain.  You develop chills.  You have nausea or vomiting.  You have continued burning or discomfort with urination. MAKE SURE YOU:   Understand these instructions.  Will watch your condition.  Will get help right away if you are not doing well or get worse. Document Released: 06/06/2005 Document Revised: 02/26/2012 Document Reviewed: 10/05/2011 [employer] Patient Information 2013 [employer].  

## 2012-07-10 LAB — URINE CULTURE: Colony Count: 6000

## 2012-07-11 ENCOUNTER — Inpatient Hospital Stay: Admission: RE | Admit: 2012-07-11 | Payer: BC Managed Care – PPO | Source: Ambulatory Visit

## 2012-07-11 ENCOUNTER — Institutional Professional Consult (permissible substitution): Payer: BC Managed Care – PPO | Admitting: Internal Medicine

## 2012-07-17 ENCOUNTER — Telehealth: Payer: Self-pay | Admitting: Family Medicine

## 2012-07-17 NOTE — Telephone Encounter (Signed)
agree

## 2012-07-17 NOTE — Telephone Encounter (Signed)
To MD for review     KP 

## 2012-07-17 NOTE — Telephone Encounter (Signed)
Caller: Romey/Patient; Patient Name: Zoe Briggs; PCP: Lelon Perla.; Best Callback Phone Number: [phone]; Pt iscalling and states that she has had a headache since 07/13/12; rates pain 8/10; has taken ultram and not helping; Triaged per Headache Guideline; See in 4 hours due to new onset of severe headache unrelieved with 4 hours of homecare; offered appt but refusing stating that she will go to UC within 4 hours due to unable to make appt in office before office closes for the day

## 2012-07-18 ENCOUNTER — Other Ambulatory Visit: Payer: Self-pay | Admitting: Family Medicine

## 2012-07-18 NOTE — Telephone Encounter (Signed)
OV 07/08/12 last filled 07/01/12 # 40 no refills.     Plz advise    MW

## 2012-07-22 ENCOUNTER — Telehealth: Payer: Self-pay | Admitting: Family Medicine

## 2012-07-22 NOTE — Telephone Encounter (Signed)
She can go to Dr Andree Coss [facility] [address]

## 2012-07-22 NOTE — Telephone Encounter (Signed)
pt stated she spoke wt/lowne  at 9.17.13 visit regarding her ankle, she now thinks it is more than a sprain, pt has spoken with insurance and she does not require a referral. Pt states she is new to the area so she is not sure what type of doctor she ned to schedule an appointment with, can you make a recommendation for her please? Callback# [phone]

## 2012-07-22 NOTE — Telephone Encounter (Signed)
Please advise      KP 

## 2012-07-22 NOTE — Telephone Encounter (Signed)
Detailed msg left on VM with the providers name and location and advise patient to call back if she needs further assistance.     KP

## 2012-07-24 ENCOUNTER — Other Ambulatory Visit: Payer: BC Managed Care – PPO

## 2012-08-04 ENCOUNTER — Other Ambulatory Visit: Payer: Self-pay | Admitting: Family Medicine

## 2012-08-04 DIAGNOSIS — F988 Other specified behavioral and emotional disorders with onset usually occurring in childhood and adolescence: Secondary | ICD-10-CM

## 2012-08-04 MED ORDER — METHYLPHENIDATE HCL ER (OSM) 18 MG PO TBCR: 18.0000 mg | EXTENDED_RELEASE_TABLET | ORAL | Status: DC

## 2012-08-04 NOTE — Telephone Encounter (Signed)
Patient aware Rx ready for pick up.      KP 

## 2012-08-04 NOTE — Telephone Encounter (Signed)
LM ON TRIAGE LINE 1204pm needs to pick up RX for concerta no later than tomorrow as she is out cb# [phone]

## 2012-08-14 ENCOUNTER — Other Ambulatory Visit: Payer: Self-pay | Admitting: Family Medicine

## 2012-08-14 NOTE — Telephone Encounter (Signed)
OK X1 

## 2012-08-14 NOTE — Telephone Encounter (Signed)
OV 07/08/12 Last filled 07/18/12 #40 no refills PLz advise    MW

## 2012-08-22 ENCOUNTER — Institutional Professional Consult (permissible substitution): Payer: BC Managed Care – PPO | Admitting: Internal Medicine

## 2012-08-27 ENCOUNTER — Encounter: Payer: Self-pay | Admitting: Lab

## 2012-08-28 ENCOUNTER — Encounter: Payer: Self-pay | Admitting: Family Medicine

## 2012-08-28 ENCOUNTER — Ambulatory Visit (INDEPENDENT_AMBULATORY_CARE_PROVIDER_SITE_OTHER): Payer: BC Managed Care – PPO | Admitting: Family Medicine

## 2012-08-28 VITALS — BP 110/68 | HR 79 | Temp 98.4°F | Wt 150.8 lb

## 2012-08-28 DIAGNOSIS — F988 Other specified behavioral and emotional disorders with onset usually occurring in childhood and adolescence: Secondary | ICD-10-CM

## 2012-08-28 MED ORDER — METHYLPHENIDATE HCL ER (OSM) 27 MG PO TBCR: 27.0000 mg | EXTENDED_RELEASE_TABLET | ORAL | Status: DC

## 2012-08-28 NOTE — Patient Instructions (Signed)
Attention Deficit Hyperactivity Disorder Attention deficit hyperactivity disorder (ADHD) is a problem with behavior issues based on the way the brain functions (neurobehavioral disorder). It is a common reason for behavior and academic problems in school. CAUSES  The cause of ADHD is unknown in most cases. It may run in families. It sometimes can be associated with learning disabilities and other behavioral problems. SYMPTOMS  There are 3 types of ADHD. The 3 types and some of the symptoms include:  Inattentive  Gets bored or distracted easily.  Loses or forgets things. Forgets to hand in homework.  Has trouble organizing or completing tasks.  Difficulty staying on task.  An inability to organize daily tasks and school work.  Leaving projects, chores, or homework unfinished.  Trouble paying attention or responding to details. Careless mistakes.  Difficulty following directions. Often seems like is not listening.  Dislikes activities that require sustained attention (like chores or homework).  Hyperactive-impulsive  Feels like it is impossible to sit still or stay in a seat. Fidgeting with hands and feet.  Trouble waiting turn.  Talking too much or out of turn. Interruptive.  Speaks or acts impulsively.  Aggressive, disruptive behavior.  Constantly busy or on the go, noisy.  Combined  Has symptoms of both of the above. Often children with ADHD feel discouraged about themselves and with school. They often perform well below their abilities in school. These symptoms can cause problems in home, school, and in relationships with peers. As children get older, the excess motor activities can calm down, but the problems with paying attention and staying organized persist. Most children do not outgrow ADHD but with good treatment can learn to cope with the symptoms. DIAGNOSIS  When ADHD is suspected, the diagnosis should be made by professionals trained in ADHD.  Diagnosis will  include:  Ruling out other reasons for the child's behavior.  The caregivers will check with the child's school and check their medical records.  They will talk to teachers and parents.  Behavior rating scales for the child will be filled out by those dealing with the child on a daily basis. A diagnosis is made only after all information has been considered. TREATMENT  Treatment usually includes behavioral treatment often along with medicines. It may include stimulant medicines. The stimulant medicines decrease impulsivity and hyperactivity and increase attention. Other medicines used include antidepressants and certain blood pressure medicines. Most experts agree that treatment for ADHD should address all aspects of the child's functioning. Treatment should not be limited to the use of medicines alone. Treatment should include structured classroom management. The parents must receive education to address rewarding good behavior, discipline, and limit-setting. Tutoring or behavioral therapy or both should be available for the child. If untreated, the disorder can have long-term serious effects into adolescence and adulthood. HOME CARE INSTRUCTIONS   Often with ADHD there is a lot of frustration among the family in dealing with the illness. There is often blame and anger that is not warranted. This is a life long illness. There is no way to prevent ADHD. In many cases, because the problem affects the family as a whole, the entire family may need help. A therapist can help the family find better ways to handle the disruptive behaviors and promote change. If the child is young, most of the therapist's work is with the parents. Parents will learn techniques for coping with and improving their child's behavior. Sometimes only the child with the ADHD needs counseling. Your caregivers can help   you make these decisions.  Children with ADHD may need help in organizing. Some helpful tips include:  Keep  routines the same every day from wake-up time to bedtime. Schedule everything. This includes homework and playtime. This should include outdoor and indoor recreation. Keep the schedule on the refrigerator or a bulletin board where it is frequently seen. Mark schedule changes as far in advance as possible.  Have a place for everything and keep everything in its place. This includes clothing, backpacks, and school supplies.  Encourage writing down assignments and bringing home needed books.  Offer your child a well-balanced diet. Breakfast is especially important for school performance. Children should avoid drinks with caffeine including:  Soft drinks.  Coffee.  Tea.  However, some older children (adolescents) may find these drinks helpful in improving their attention.  Children with ADHD need consistent rules that they can understand and follow. If rules are followed, give small rewards. Children with ADHD often receive, and expect, criticism. Look for good behavior and praise it. Set realistic goals. Give clear instructions. Look for activities that can foster success and self-esteem. Make time for pleasant activities with your child. Give lots of affection.  Parents are their children's greatest advocates. Learn as much as possible about ADHD. This helps you become a stronger and better advocate for your child. It also helps you educate your child's teachers and instructors if they feel inadequate in these areas. Parent support groups are often helpful. A national group with local chapters is called CHADD (Children and Adults with Attention Deficit Hyperactivity Disorder). PROGNOSIS  There is no cure for ADHD. Children with the disorder seldom outgrow it. Many find adaptive ways to accommodate the ADHD as they mature. SEEK MEDICAL CARE IF:  Your child has repeated muscle twitches, cough or speech outbursts.  Your child has sleep problems.  Your child has a marked loss of  appetite.  Your child develops depression.  Your child has new or worsening behavioral problems.  Your child develops dizziness.  Your child has a racing heart.  Your child has stomach pains.  Your child develops headaches. Document Released: 08/17/2002 Document Revised: 11/19/2011 Document Reviewed: 03/29/2008 ExitCare Patient Information 2013 [employer].  

## 2012-08-29 ENCOUNTER — Encounter: Payer: Self-pay | Admitting: Family Medicine

## 2012-08-29 NOTE — Assessment & Plan Note (Addendum)
Inc dose to 27  mg rto  6 months

## 2012-08-29 NOTE — Progress Notes (Signed)
  Subjective:    Patient ID: Zoe Briggs, female    DOB: 12/13/1975, 36 y.o.   MRN: [id number]  HPI  Pt here f/u add.  No complaints except she feels that med wears off quickly.Marland Kitchen  She would like to inc dose.    Review of Systems    as above Objective:   Physical Exam  BP 110/68  Pulse 79  Temp 98.4 F (36.9 C) (Oral)  Wt 150 lb 12.8 oz (68.402 kg)  SpO2 97% General appearance: alert, cooperative, appears stated age and no distress Lungs: clear to auscultation bilaterally Heart: S1, S2 normal Extremities: extremities normal, atraumatic, no cyanosis or edema      Assessment & Plan:

## 2012-09-04 ENCOUNTER — Other Ambulatory Visit: Payer: Self-pay | Admitting: Internal Medicine

## 2012-09-04 NOTE — Telephone Encounter (Signed)
Dr.Lowne is out of office, will send to covering MD (Dr.Tabori). Last OV 08/28/12, last filled 08/14/12 #40

## 2012-09-30 ENCOUNTER — Ambulatory Visit: Payer: BC Managed Care – PPO | Admitting: Family Medicine

## 2012-10-02 ENCOUNTER — Encounter: Payer: Self-pay | Admitting: Lab

## 2012-10-03 ENCOUNTER — Ambulatory Visit (INDEPENDENT_AMBULATORY_CARE_PROVIDER_SITE_OTHER): Payer: BC Managed Care – PPO | Admitting: Family Medicine

## 2012-10-03 ENCOUNTER — Encounter: Payer: Self-pay | Admitting: Family Medicine

## 2012-10-03 VITALS — BP 112/70 | HR 72 | Temp 98.2°F | Wt 153.8 lb

## 2012-10-03 DIAGNOSIS — G43909 Migraine, unspecified, not intractable, without status migrainosus: Secondary | ICD-10-CM

## 2012-10-03 MED ORDER — TOPIRAMATE 25 MG PO TABS: ORAL_TABLET | ORAL | Status: DC

## 2012-10-03 MED ORDER — LANSOPRAZOLE 30 MG PO CPDR: 30.0000 mg | DELAYED_RELEASE_CAPSULE | Freq: Every day | ORAL | Status: DC

## 2012-10-03 NOTE — Patient Instructions (Signed)
Migraine Headache A migraine headache is an intense, throbbing pain on one or both sides of your head. A migraine can last for 30 minutes to several hours. CAUSES  The exact cause of a migraine headache is not always known. However, a migraine may be caused when nerves in the brain become irritated and release chemicals that cause inflammation. This causes pain. SYMPTOMS  Pain on one or both sides of your head.  Pulsating or throbbing pain.  Severe pain that prevents daily activities.  Pain that is aggravated by any physical activity.  Nausea, vomiting, or both.  Dizziness.  Pain with exposure to bright lights, loud noises, or activity.  General sensitivity to bright lights, loud noises, or smells. Before you get a migraine, you may get warning signs that a migraine is coming (aura). An aura may include:  Seeing flashing lights.  Seeing bright spots, halos, or zig-zag lines.  Having tunnel vision or blurred vision.  Having feelings of numbness or tingling.  Having trouble talking.  Having muscle weakness. MIGRAINE TRIGGERS  Alcohol.  Smoking.  Stress.  Menstruation.  Aged cheeses.  Foods or drinks that contain nitrates, glutamate, aspartame, or tyramine.  Lack of sleep.  Chocolate.  Caffeine.  Hunger.  Physical exertion.  Fatigue.  Medicines used to treat chest pain (nitroglycerine), birth control pills, estrogen, and some blood pressure medicines. DIAGNOSIS  A migraine headache is often diagnosed based on:  Symptoms.  Physical examination.  A CT scan or MRI of your head. TREATMENT Medicines may be given for pain and nausea. Medicines can also be given to help prevent recurrent migraines.  HOME CARE INSTRUCTIONS  Only take over-the-counter or prescription medicines for pain or discomfort as directed by your caregiver. The use of long-term narcotics is not recommended.  Lie down in a dark, quiet room when you have a migraine.  Keep a journal  to find out what may trigger your migraine headaches. For example, write down:  What you eat and drink.  How much sleep you get.  Any change to your diet or medicines.  Limit alcohol consumption.  Quit smoking if you smoke.  Get 7 to 9 hours of sleep, or as recommended by your caregiver.  Limit stress.  Keep lights dim if bright lights bother you and make your migraines worse. SEEK IMMEDIATE MEDICAL CARE IF:   Your migraine becomes severe.  You have a fever.  You have a stiff neck.  You have vision loss.  You have muscular weakness or loss of muscle control.  You start losing your balance or have trouble walking.  You feel faint or pass out.  You have severe symptoms that are different from your first symptoms. MAKE SURE YOU:   Understand these instructions.  Will watch your condition.  Will get help right away if you are not doing well or get worse. Document Released: 08/27/2005 Document Revised: 11/19/2011 Document Reviewed: 08/17/2011 ExitCare Patient Information 2013 [employer].  

## 2012-10-03 NOTE — Progress Notes (Signed)
  Subjective:    Zoe Briggs is a 37 y.o. female who presents for follow-up of migraine headaches. Home treatment has included ultram with little improvement. Headaches are occurring continuously. Generally, the headaches last about 4 days. Work attendance or other daily activities are not affected by the headaches. The patient denies dizziness, loss of balance, muscle weakness, numbness of extremities, speech difficulties, vision problems, vomiting in the early morning and worsening school/work performance.  The following portions of the patient's history were reviewed and updated as appropriate: allergies, current medications, past family history, past medical history, past social history, past surgical history and problem list.  Review of Systems Pertinent items are noted in HPI.    Objective:    BP 112/70  Pulse 72  Temp 98.2 F (36.8 C) (Oral)  Wt 153 lb 12.8 oz (69.763 kg)  SpO2 96% General appearance: alert, cooperative, appears stated age and no distress Eyes: conjunctivae/corneas clear. PERRL, EOM's intact. Fundi benign. Ears: normal TM's and external ear canals both ears Throat: lips, mucosa, and tongue normal; teeth and gums normal Neck: no adenopathy, supple, symmetrical, trachea midline and thyroid not enlarged, symmetric, no tenderness/mass/nodules Neurologic: Alert and oriented X 3, normal strength and tone. Normal symmetric reflexes. Normal coordination and gait    Assessment:    migraine    Plan:    Continue present treatment and plan. Lie in darkened room and apply cold packs as needed for pain. Prophylactic therapy: topamax due to high frequency of pain. Side effect profile discussed in detail. rto prn

## 2012-10-06 ENCOUNTER — Ambulatory Visit: Payer: BC Managed Care – PPO | Admitting: Family Medicine

## 2012-10-07 ENCOUNTER — Other Ambulatory Visit: Payer: Self-pay | Admitting: Family Medicine

## 2012-10-07 NOTE — Telephone Encounter (Signed)
Patient Information:  Caller Name: Gloria  Phone: [phone]  Patient: Zoe Briggs, Zoe Briggs  Gender: Female  DOB: 31-Oct-1975  Age: 37 Years  PCP: Lelon Perla.  Pregnant: No  Office Follow Up:  Does the office need to follow up with this patient?: No  Instructions For The Office: N/A   Symptoms  Reason For Call & Symptoms: Pt was instructed to return for migraine uncontrolled. Pt was in last week and changed to Topimax injectable.  Reviewed Health History In EMR: Yes  Reviewed Medications In EMR: Yes  Reviewed Allergies In EMR: Yes  Reviewed Surgeries / Procedures: Yes  Date of Onset of Symptoms: 10/06/2012 OB / GYN:  LMP: Unknown  Guideline(s) Used:  Headache  Disposition Per Guideline:   Callback by PCP or Subspecialist within 1 Hour  Reason For Disposition Reached:   Severe headache and has had severe headaches before  Advice Given:  N/A  RN Overrode Recommendation:  Follow Up With Office Later  Pt was asking to be scheduled for an injection for her migraine h/a. RN checked in EPIC/notes from office visit did not indicate this. RN called the back line to speak with Dr. Hulan Saas nurse. She advised they are closing now d/t inclimate weather. Pt may call back tomorrow if needed.

## 2012-10-08 NOTE — Telephone Encounter (Signed)
Last seen 10/03/10 and filled 09/04/12 #40. Please advise      KP

## 2012-10-11 ENCOUNTER — Encounter: Payer: Self-pay | Admitting: Internal Medicine

## 2012-10-11 ENCOUNTER — Ambulatory Visit (INDEPENDENT_AMBULATORY_CARE_PROVIDER_SITE_OTHER): Payer: BC Managed Care – PPO | Admitting: Internal Medicine

## 2012-10-11 VITALS — BP 104/68 | HR 79 | Temp 98.1°F | Wt 150.0 lb

## 2012-10-11 DIAGNOSIS — J310 Chronic rhinitis: Secondary | ICD-10-CM

## 2012-10-11 DIAGNOSIS — J209 Acute bronchitis, unspecified: Secondary | ICD-10-CM

## 2012-10-11 MED ORDER — AZITHROMYCIN 250 MG PO TABS: ORAL_TABLET | ORAL | Status: DC

## 2012-10-11 MED ORDER — FLUTICASONE PROPIONATE 50 MCG/ACT NA SUSP: 1.0000 | Freq: Two times a day (BID) | NASAL | Status: DC | PRN

## 2012-10-11 NOTE — Patient Instructions (Signed)

## 2012-10-11 NOTE — Progress Notes (Signed)
  Subjective:    Patient ID: Zoe Briggs, female    DOB: 10/16/1975, 37 y.o.   MRN: [id number]  HPI The respiratory tract symptoms began  10/07/12 as  Rhinitis. Significant active  associated symptoms include  sore throat  & earache w/o discharge.  Cough is  associated with  shortness of breath and wheezing . Sputum is described as scant & white.No yellow, green  present    Extrinsic symptoms of itchy, watery eyes, or sneezing were present .     Flu shot NOT current        Treatment with  Dayquil, Mucinex Cold Sinus, Robitussin was partially effective. RescueMDI helps   There is  history of asthma & perennial allergies. The patient had never smoked .                Review of Systems Symptoms not present include frontal headache, facial pain, dental pain, &  nasal purulence. Fever,chills & sweats not present  Myalgias and arthralgias were not present    Objective:   Physical Exam General appearance:good health ;well nourished; no acute distress or increased work of breathing is resent. Minor left cervical lymphadenopathy. No axillary lymphadenopathy Eyes: No conjunctival inflammation or lid edema is present.  Ears:  External ear exam shows no significant lesions or deformities.  Otoscopic examination reveals clear canals, tympanic membranes are intact bilaterally without bulging, retraction, inflammation or discharge. L TM slightly dull Nose:  External nasal examination shows no deformity or inflammation. The right near is boggy, swollen, and occluded Oral exam: Dental hygiene is good; lips and gums are healthy appearing.There is no oropharyngeal erythema or exudate noted.  Neck:  No deformities, masses, or tenderness noted.   Heart:  Normal rate and regular rhythm. S1 and S2 normal without gallop, murmur, click, rub or other extra sounds.  Lungs:Chest clear to auscultation; no wheezes, rhonchi,rales ,or rubs present.No increased work of breathing.    Extremities:  No cyanosis, edema, or clubbing  noted  Skin: Warm & dry           Assessment & Plan:  #1 acute bronchitis w/o bronchospasm #2 rhinitis Plan: See orders and recommendations

## 2012-10-13 ENCOUNTER — Telehealth: Payer: Self-pay | Admitting: Family Medicine

## 2012-10-13 NOTE — Telephone Encounter (Signed)
Patient Information:  Caller Name: Malana  Phone: [phone]  Patient: Zoe Briggs, Zoe Briggs  Gender: Female  DOB: 01/12/1976  Age: 37 Years  PCP: Lelon Perla.  Pregnant: No  Office Follow Up:  Does the office need to follow up with this patient?: No  Instructions For The Office: N/A   Symptoms  Reason For Call & Symptoms: Patient relates cough worse since seen by Dr. Alwyn Ren on 10/11/12. Diagnosed with bronchitis.  Sinus pain1/28/14  followed by chest involvement as of 10/11/12.  Whtie to yellow sputum.  Temp 98.3 under arm.  Reviewed Health History In EMR: Yes  Reviewed Medications In EMR: Yes  Reviewed Allergies In EMR: Yes  Reviewed Surgeries / Procedures: Yes  Date of Onset of Symptoms: 10/07/2012  Treatments Tried: Z-pack, Nasal Spray,  Allegra.  Robitussin DM, Cough Drops.  Treatments Tried Worked: No OB / GYN:  LMP: Unknown  Guideline(s) Used:  Cough  Disposition Per Guideline:   Home Care  Reason For Disposition Reached:   Cough with cold symptoms (e.g., runny nose, postnasal drip, throat clearing)  Advice Given:  Reassurance  Coughing is the way that our lungs remove irritants and mucus. It helps protect our lungs from getting pneumonia.  You can get a dry hacking cough after a chest cold. Sometimes this type of cough can last 1-3 weeks, and be worse at night.  Here is some care advice that should help.  Cough Medicines:  OTC Cough Drops: Cough drops can help a lot, especially for mild coughs. They reduce coughing by soothing your irritated throat and removing that tickle sensation in the back of the throat. Cough drops also have the advantage of portability - you can carry them with you.  Home Remedy - Hard Candy: Hard candy works just as well as medicine-flavored OTC cough drops. Diabetics should use sugar-free candy.  Home Remedy - Honey: This old home remedy has been shown to help decrease coughing at night. The adult dosage is 2 teaspoons (10 ml) at bedtime.  Honey should not be given to infants under one year of age.  Coughing Spasms:  Drink warm fluids. Inhale warm mist (Reason: both relax the airway and loosen up the phlegm).  Suck on cough drops or hard candy to coat the irritated throat.  Prevent Dehydration:  Drink adequate liquids.  This will help soothe an irritated or dry throat and loosen up the phlegm.  Expected Course:   The expected course depends on what is causing the cough.  Viral bronchitis (chest cold) causes a cough that lasts 1 to 3 weeks. Sometimes you may cough up lots of phlegm (sputum, mucus). The mucus can normally be white, gray, yellow, or green.  Call Back If:  Difficulty breathing occurs  Fever lasts more than 3 days  Nasal discharge lasts more than 10 days  Cough lasts more than 3 weeks  You become worse

## 2012-10-13 NOTE — Telephone Encounter (Signed)
[facility] Triage Call Report Triage Record Num: [id number] Operator: Alphonsa Overall Patient Name: Zoe Briggs Call Date & Time: 10/10/2012 9:05:55PM Patient Phone: [phone] PCP: Lelon Perla Patient Gender: Female PCP Fax : [phone] Patient DOB: 1976/01/18 Practice Name: [facility] Reason for Call: No menses/past radiation. Emryn calling about congestion in chest. Onset 10/07/12. Pressure in chest. Coughing helps. Xopenex inhaler helps last at 1800. Afebrile. Robitussin DM, Mucinex not helping. Afebrile. New worsening cough/tight chest. Care advice given per Cough Protocol. Appt 10/11/12 at 1015 with Dr Elmon Else office. Protocol(s) Used: Cough - Adult Protocol(s) Used: Upper Respiratory Infection (URI) Recommended Outcome per Protocol: See Provider within 24 hours Reason for Outcome: Cough is the symptom causing most concern New onset or worsening cough AND asthma with increasing frequency of flare-ups since last scheduled appointment Care Advice: Limit or avoid exposure to irritants and allergens (e.g. air pollution, smoke/smoking, chemicals, dust, pollen, pet dander, etc.) ~ ~ Avoid any activity that produces symptoms until evaluated by provider. Call EMS 911 if sudden worsening of breathing problems, struggling to breathe, high pitched noise when breathing in (stridor), unable to speak, grasping at throat, or panic/anxiety because of breathing problems. ~ ~ Use prescribed rescue medication (inhaler, nebulizer) as directed. ~ SYMPTOM / CONDITION MANAGEMENT ~ List, or take, all current prescription(s), nonprescription or alternative medication(s) to provider for evaluation.

## 2012-11-04 ENCOUNTER — Ambulatory Visit (INDEPENDENT_AMBULATORY_CARE_PROVIDER_SITE_OTHER): Payer: BC Managed Care – PPO | Admitting: Family Medicine

## 2012-11-04 ENCOUNTER — Encounter: Payer: Self-pay | Admitting: Family Medicine

## 2012-11-04 ENCOUNTER — Telehealth: Payer: Self-pay | Admitting: Family Medicine

## 2012-11-04 VITALS — BP 100/70 | HR 73 | Temp 97.6°F | Ht 70.25 in | Wt 151.2 lb

## 2012-11-04 DIAGNOSIS — M545 Low back pain, unspecified: Secondary | ICD-10-CM

## 2012-11-04 DIAGNOSIS — N390 Urinary tract infection, site not specified: Secondary | ICD-10-CM | POA: Insufficient documentation

## 2012-11-04 LAB — POCT URINALYSIS DIPSTICK
Bilirubin, UA: NEGATIVE
Glucose, UA: NEGATIVE
Ketones, UA: NEGATIVE
Nitrite, UA: NEGATIVE
Protein, UA: NEGATIVE
Spec Grav, UA: 1.02
Urobilinogen, UA: 0.2
pH, UA: 6

## 2012-11-04 MED ORDER — CEPHALEXIN 500 MG PO CAPS: 500.0000 mg | ORAL_CAPSULE | Freq: Two times a day (BID) | ORAL | Status: AC

## 2012-11-04 MED ORDER — TOPIRAMATE 25 MG PO TABS: ORAL_TABLET | ORAL | Status: DC

## 2012-11-04 NOTE — Progress Notes (Signed)
  Subjective:    Patient ID: Zoe Briggs, female    DOB: 02/26/1976, 37 y.o.   MRN: [id number]  HPI 'i think my bladder infxn turned into a kidney infxn again'.  sxs started 1 week ago w/ dysuria.  Tried to self tx w/ cranberry juice.  Hx of similar.  'i don't like taking abx all the time'.  Last 2 infxns 'went to my kidneys- quickly'.  No fevers.  No hematuria.  + frequency, urgency.  No suprapubic pain or pressure.  Now having low back pain bilaterally.   Review of Systems For ROS see HPI     Objective:   Physical Exam  Vitals reviewed. Constitutional: She appears well-developed and well-nourished. No distress.  Abdominal: Soft. She exhibits no distension. There is no tenderness (no suprapubic or CVA tenderness).          Assessment & Plan:

## 2012-11-04 NOTE — Telephone Encounter (Signed)
Patient was seen today with Dr.Tabori.       KP 

## 2012-11-04 NOTE — Assessment & Plan Note (Signed)
New to provider.  Pt reports this is ongoing problem for her.  Discussed possible UTI prophylaxis- pt very uncertain about this.  Offered urology referral for additional discussion/info/tx options.  Pt agreeable to this.  Will start abx based on sxs and UA.  Reviewed supportive care and red flags that should prompt return.  Pt expressed understanding and is in agreement w/ plan.

## 2012-11-04 NOTE — Telephone Encounter (Signed)
[facility] Triage Call Report Triage Record Num: [id number] Operator: Rebeca Allegra Patient Name: Zoe Briggs Call Date & Time: 11/03/2012 10:32:04PM Patient Phone: [phone] PCP: Lelon Perla Patient Gender: Female PCP Fax : [phone] Patient DOB: May 03, 1976 Practice Name: [facility] Reason for Call: Caller: Zoe Briggs/Patient; PCP: Lelon Perla.; CB#: [id number]; Call regarding Urinary Pain/Bleeding; onset 10/27/12-pain with urination. Pt also c/o of back pain; onset 11/02/12. Afebrile. All emergent symptoms ruled out per Flank Pain protocol with the exception of "Flank pain or low back pain and urinary symptoms". Advised pt to be seen within the next 4hrs. Pt refused. Per standing orders for UTI Symptoms: "No RX orders. Pt needs eval". Pt requested an appt. Appt scheduled for 11/04/12 11:15. Protocol(s) Used: Flank Pain Protocol(s) Used: Urinary Symptoms - Female Recommended Outcome per Protocol: See Provider within 4 hours Reason for Outcome: Flank pain Flank pain or low back pain AND urinary tract symptoms Care Advice: ~

## 2012-11-04 NOTE — Patient Instructions (Addendum)
This appears to be a bladder infection Drink plenty of fluids Start the Keflex twice daily Call if symptoms change or worsen Hang in there!!!

## 2012-11-06 LAB — URINE CULTURE: Colony Count: 45000

## 2012-11-20 ENCOUNTER — Other Ambulatory Visit: Payer: Self-pay | Admitting: Family Medicine

## 2012-11-20 NOTE — Telephone Encounter (Signed)
Last seen 10/03/12 and filled 09/04/12 #40. Please advise     KP

## 2012-12-29 ENCOUNTER — Other Ambulatory Visit: Payer: Self-pay | Admitting: Family Medicine

## 2012-12-29 NOTE — Telephone Encounter (Signed)
Last seen 10/03/12 and filled 11/20/12#40. Please advise     KP

## 2013-02-03 ENCOUNTER — Telehealth: Payer: Self-pay | Admitting: Family Medicine

## 2013-02-03 NOTE — Telephone Encounter (Signed)
Patient is calling and states that her knee pain that she spoke with Dr. Laury Axon about is not getting better. Patient requests a referral to Orthopedics.

## 2013-02-04 ENCOUNTER — Other Ambulatory Visit: Payer: Self-pay | Admitting: Family Medicine

## 2013-02-04 DIAGNOSIS — M25561 Pain in right knee: Secondary | ICD-10-CM

## 2013-02-04 DIAGNOSIS — M25562 Pain in left knee: Secondary | ICD-10-CM

## 2013-02-04 NOTE — Telephone Encounter (Signed)
Referral in

## 2013-02-04 NOTE — Telephone Encounter (Signed)
Please advise      KP 

## 2013-02-17 ENCOUNTER — Other Ambulatory Visit: Payer: Self-pay | Admitting: Family Medicine

## 2013-02-17 NOTE — Telephone Encounter (Signed)
Last seen 10/03/12 and filled 12/29/12 #40. Please advise     KP

## 2013-03-14 ENCOUNTER — Other Ambulatory Visit: Payer: Self-pay | Admitting: Family Medicine

## 2013-04-08 ENCOUNTER — Telehealth: Payer: Self-pay | Admitting: Family Medicine

## 2013-04-08 NOTE — Telephone Encounter (Signed)
Patient is calling to request a refill on her Tramadol Rx. She is almost out. Patient uses [facility]in [facility] at [facility].  Patient is also calling to request that her COMBIPATCH 0.05-0.14 MG/DAY medication be filled. Patient says that this medication was originally prescribed by another doctor that she saw before she started seeing Dr. Laury Axon. She is no longer seeing that physician.

## 2013-04-11 ENCOUNTER — Other Ambulatory Visit: Payer: Self-pay | Admitting: Family Medicine

## 2013-04-13 MED ORDER — TRAMADOL HCL 50 MG PO TABS: ORAL_TABLET | ORAL | Status: DC

## 2013-04-13 MED ORDER — ESTRADIOL-NORETHINDRONE ACET 0.05-0.14 MG/DAY TD PTTW: 1.0000 | MEDICATED_PATCH | TRANSDERMAL | Status: DC

## 2013-04-13 NOTE — Telephone Encounter (Signed)
Ok for #30 on the Ultram

## 2013-04-13 NOTE — Telephone Encounter (Signed)
Rx sent      KP 

## 2013-04-13 NOTE — Telephone Encounter (Signed)
Last seen 10/03/12 and filled 02/17/13 #40 with 2 refills, Contract signed but UDS not on file. Please advise      KP

## 2013-06-09 ENCOUNTER — Other Ambulatory Visit: Payer: Self-pay | Admitting: Family Medicine

## 2013-06-15 ENCOUNTER — Encounter: Payer: Self-pay | Admitting: Family Medicine

## 2013-06-15 ENCOUNTER — Ambulatory Visit (INDEPENDENT_AMBULATORY_CARE_PROVIDER_SITE_OTHER): Payer: BC Managed Care – PPO | Admitting: Family Medicine

## 2013-06-15 VITALS — BP 102/64 | HR 75 | Temp 98.4°F | Wt 146.2 lb

## 2013-06-15 DIAGNOSIS — G43909 Migraine, unspecified, not intractable, without status migrainosus: Secondary | ICD-10-CM

## 2013-06-15 DIAGNOSIS — J019 Acute sinusitis, unspecified: Secondary | ICD-10-CM

## 2013-06-15 DIAGNOSIS — K219 Gastro-esophageal reflux disease without esophagitis: Secondary | ICD-10-CM

## 2013-06-15 DIAGNOSIS — J322 Chronic ethmoidal sinusitis: Secondary | ICD-10-CM

## 2013-06-15 DIAGNOSIS — F411 Generalized anxiety disorder: Secondary | ICD-10-CM

## 2013-06-15 MED ORDER — CITALOPRAM HYDROBROMIDE 20 MG PO TABS: ORAL_TABLET | ORAL | Status: DC

## 2013-06-15 MED ORDER — CEFUROXIME AXETIL 500 MG PO TABS: 500.0000 mg | ORAL_TABLET | Freq: Two times a day (BID) | ORAL | Status: AC

## 2013-06-15 MED ORDER — LANSOPRAZOLE 30 MG PO CPDR: 30.0000 mg | DELAYED_RELEASE_CAPSULE | Freq: Every day | ORAL | Status: DC

## 2013-06-15 MED ORDER — KETOROLAC TROMETHAMINE 60 MG/2ML IM SOLN
60.0000 mg | Freq: Once | INTRAMUSCULAR | Status: AC
  Administered 2013-06-15: 60 mg via INTRAMUSCULAR

## 2013-06-15 MED ORDER — TOPIRAMATE 50 MG PO TABS: 50.0000 mg | ORAL_TABLET | Freq: Every day | ORAL | Status: DC

## 2013-06-15 MED ORDER — TRAMADOL HCL 50 MG PO TABS: ORAL_TABLET | ORAL | Status: DC

## 2013-06-15 NOTE — Progress Notes (Signed)
  Subjective:    Zoe Briggs is a 37 y.o. female who presents for follow-up of migraine headaches. Home treatment has included topamax with little improvement. Headaches are occurring continuously. Generally, the headaches last about several hours. Work attendance or other daily activities are affected by the headaches. The patient denies decreased physical activity. They became worse again with the change in the weather.  She has changed her antihistamine to allegra.    The following portions of the patient's history were reviewed and updated as appropriate: allergies, current medications, past family history, past medical history, past social history, past surgical history and problem list.  Review of Systems Pertinent items are noted in HPI.    Objective:    BP 102/64  Pulse 75  Temp(Src) 98.4 F (36.9 C) (Oral)  Wt 146 lb 3.2 oz (66.316 kg)  BMI 20.84 kg/m2  SpO2 95% General appearance: alert, cooperative, appears stated age and no distress Head: Normocephalic, without obvious abnormality, atraumatic Ears: normal TM's and external ear canals both ears Nose: turbinates red, swollen, sinus tenderness bilateral Throat: lips, mucosa, and tongue normal; teeth and gums normal Neck: no adenopathy, supple, symmetrical, trachea midline and thyroid not enlarged, symmetric, no tenderness/mass/nodules Lungs: clear to auscultation bilaterally Heart: S1, S2 normal    Assessment:    migraine --- ? sinus related    Plan:    Continue present treatment and plan. Lie in darkened room and apply cold packs as needed for pain. ceftin x 10 days   Call or rto prn

## 2013-06-15 NOTE — Patient Instructions (Addendum)

## 2013-07-13 ENCOUNTER — Telehealth: Payer: Self-pay | Admitting: Family Medicine

## 2013-07-13 ENCOUNTER — Other Ambulatory Visit: Payer: Self-pay | Admitting: Family Medicine

## 2013-07-13 DIAGNOSIS — L600 Ingrowing nail: Secondary | ICD-10-CM

## 2013-07-13 NOTE — Telephone Encounter (Signed)
Patient called and stated that she has an ingrown toenail and wanted to see if dr Zoe Briggs would refer her to a podiatrist. Thanks

## 2013-07-13 NOTE — Telephone Encounter (Signed)
Please advise      KP 

## 2013-07-20 ENCOUNTER — Telehealth: Payer: Self-pay | Admitting: Family Medicine

## 2013-07-20 DIAGNOSIS — G43909 Migraine, unspecified, not intractable, without status migrainosus: Secondary | ICD-10-CM

## 2013-07-20 NOTE — Telephone Encounter (Signed)
Patient called and requested a refill for traMADol (ULTRAM) 50 MG tablet   Pharmacy [address] [place], [place] - [address]

## 2013-07-21 ENCOUNTER — Ambulatory Visit: Payer: BC Managed Care – PPO | Admitting: Podiatry

## 2013-07-21 MED ORDER — TRAMADOL HCL 50 MG PO TABS: ORAL_TABLET | ORAL | Status: DC

## 2013-07-21 NOTE — Telephone Encounter (Signed)
Ask patient to provide a UDS at her earliest convenience, prescription printed

## 2013-07-21 NOTE — Telephone Encounter (Signed)
Patient is requesting refill on Tramadol. Patient was seen on 06/15/2013. Last filled on 10/06/201 #40, 0 refills. No UDS on file and contract signed. Please advise. SW

## 2013-08-10 ENCOUNTER — Telehealth: Payer: Self-pay | Admitting: Family Medicine

## 2013-08-10 NOTE — Telephone Encounter (Signed)
Patient states that she has seasonal allergies and wants to know if Dr. Laury Axon can write her a prescription for Xopenex , name brand only. She says that she has used it before, as needed, but has run out of it. Wants to let Dr. Laury Axon know that she cannot use Albuterol because it makes her heart race. Please advise.

## 2013-08-11 MED ORDER — LEVALBUTEROL TARTRATE 45 MCG/ACT IN AERO: 2.0000 | INHALATION_SPRAY | Freq: Three times a day (TID) | RESPIRATORY_TRACT | Status: DC | PRN

## 2013-08-11 NOTE — Telephone Encounter (Signed)
Rx is not on medication list. Please advise       KP

## 2013-08-11 NOTE — Telephone Encounter (Signed)
Rx sent and the patient has been made aware     KP 

## 2013-08-11 NOTE — Telephone Encounter (Signed)
xopnenex inhaler--  2 puffs q8h prn #1  1 refill

## 2013-08-13 ENCOUNTER — Telehealth: Payer: Self-pay | Admitting: Family Medicine

## 2013-08-13 MED ORDER — LEVALBUTEROL TARTRATE 45 MCG/ACT IN AERO: 2.0000 | INHALATION_SPRAY | Freq: Three times a day (TID) | RESPIRATORY_TRACT | Status: DC | PRN

## 2013-08-13 NOTE — Telephone Encounter (Signed)
Karin Golden is calling to let us know that the patient's levalbuterol [facility] HFA) 45 MCG/ACT inhaler rx does need prior auth. She tried to fax form but she says it keeps failing. Ph# to initiate authorization is [phone].

## 2013-08-13 NOTE — Telephone Encounter (Signed)
PA is pending at this time. Waiting on response from the insurance company per [place].      KP

## 2013-08-13 NOTE — Telephone Encounter (Signed)
Spoke with patient and she stated her insurance company said we needed to complete something advising why she would need this medication, she was unsure if it was a PA or not so I resent the Rx to get a response back from the pharmacy.        KP

## 2013-08-13 NOTE — Telephone Encounter (Deleted)
Karin Golden is calling to let us know that the patient's rx does need prior auth. She tried to fax form but she says it keeps failing. Ph# to initiate authorization is [phone].

## 2013-08-13 NOTE — Telephone Encounter (Signed)
Patient called and stated that her pharmacy told her to see could dr Zoe Briggs write a letter or verbally tell her insurance why she needs to take xopnenex inhaler instead of taking albuterol. Patient would like a call back from dr Zoe Briggs nurse.

## 2013-08-18 ENCOUNTER — Other Ambulatory Visit: Payer: Self-pay | Admitting: Family Medicine

## 2013-08-18 ENCOUNTER — Encounter: Payer: Self-pay | Admitting: Family Medicine

## 2013-08-18 ENCOUNTER — Ambulatory Visit (INDEPENDENT_AMBULATORY_CARE_PROVIDER_SITE_OTHER): Payer: BC Managed Care – PPO | Admitting: Family Medicine

## 2013-08-18 VITALS — BP 92/70 | HR 91 | Temp 98.2°F | Ht 70.5 in | Wt 148.0 lb

## 2013-08-18 DIAGNOSIS — R5381 Other malaise: Secondary | ICD-10-CM

## 2013-08-18 DIAGNOSIS — L659 Nonscarring hair loss, unspecified: Secondary | ICD-10-CM

## 2013-08-18 DIAGNOSIS — Z0000 Encounter for general adult medical examination without abnormal findings: Secondary | ICD-10-CM

## 2013-08-18 DIAGNOSIS — R5383 Other fatigue: Secondary | ICD-10-CM

## 2013-08-18 HISTORY — DX: Nonscarring hair loss, unspecified: L65.9

## 2013-08-18 LAB — CBC WITH DIFFERENTIAL/PLATELET
Basophils Absolute: 0 10*3/uL (ref 0.0–0.1)
Basophils Relative: 0.4 % (ref 0.0–3.0)
Eosinophils Absolute: 0 10*3/uL (ref 0.0–0.7)
Eosinophils Relative: 0.8 % (ref 0.0–5.0)
HCT: 40.9 % (ref 36.0–46.0)
Hemoglobin: 13.7 g/dL (ref 12.0–15.0)
Lymphocytes Relative: 21.2 % (ref 12.0–46.0)
Lymphs Abs: 0.8 10*3/uL (ref 0.7–4.0)
MCHC: 33.6 g/dL (ref 30.0–36.0)
MCV: 94.4 fl (ref 78.0–100.0)
Monocytes Absolute: 0.3 10*3/uL (ref 0.1–1.0)
Monocytes Relative: 9.2 % (ref 3.0–12.0)
Neutro Abs: 2.5 10*3/uL (ref 1.4–7.7)
Neutrophils Relative %: 68.4 % (ref 43.0–77.0)
Platelets: 200 10*3/uL (ref 150.0–400.0)
RBC: 4.33 Mil/uL (ref 3.87–5.11)
RDW: 12.9 % (ref 11.5–14.6)
WBC: 3.7 10*3/uL — ABNORMAL LOW (ref 4.5–10.5)

## 2013-08-18 LAB — BASIC METABOLIC PANEL
BUN: 14 mg/dL (ref 6–23)
CO2: 26 mEq/L (ref 19–32)
Calcium: 9.2 mg/dL (ref 8.4–10.5)
Chloride: 104 mEq/L (ref 96–112)
Creatinine, Ser: 0.8 mg/dL (ref 0.4–1.2)
GFR: 87.34 mL/min (ref >60.00)
Glucose, Bld: 67 mg/dL — ABNORMAL LOW (ref 70–99)
Potassium: 3.6 mEq/L (ref 3.5–5.1)
Sodium: 135 mEq/L (ref 135–145)

## 2013-08-18 LAB — POCT URINALYSIS DIPSTICK
Bilirubin, UA: NEGATIVE
Blood, UA: NEGATIVE
Glucose, UA: NEGATIVE
Ketones, UA: NEGATIVE
Leukocytes, UA: NEGATIVE
Nitrite, UA: NEGATIVE
Protein, UA: NEGATIVE
Spec Grav, UA: 1.01
Urobilinogen, UA: 0.2
pH, UA: 6

## 2013-08-18 LAB — HEPATIC FUNCTION PANEL
ALT: 20 U/L (ref 0–35)
AST: 19 U/L (ref 0–37)
Albumin: 4.6 g/dL (ref 3.5–5.2)
Alkaline Phosphatase: 56 U/L (ref 39–117)
Bilirubin, Direct: 0 mg/dL (ref 0.0–0.3)
Total Bilirubin: 0.6 mg/dL (ref 0.3–1.2)
Total Protein: 7.4 g/dL (ref 6.0–8.3)

## 2013-08-18 LAB — LIPID PANEL
Cholesterol: 206 mg/dL — ABNORMAL HIGH (ref 0–200)
HDL: 89.7 mg/dL (ref >39.00)
Total CHOL/HDL Ratio: 2
Triglycerides: 87 mg/dL (ref 0.0–149.0)
VLDL: 17.4 mg/dL (ref 0.0–40.0)

## 2013-08-18 LAB — VITAMIN B12: Vitamin B-12: 448 pg/mL (ref 211–911)

## 2013-08-18 NOTE — Patient Instructions (Signed)
Preventive Care for Adults, Female A healthy lifestyle and preventive care can promote health and wellness. Preventive health guidelines for women include the following key practices.  A routine yearly physical is a good way to check with your caregiver about your health and preventive screening. It is a chance to share any concerns and updates on your health, and to receive a thorough exam.  Visit your dentist for a routine exam and preventive care every 6 months. Brush your teeth twice a day and floss once a day. Good oral hygiene prevents tooth decay and gum disease.  The frequency of eye exams is based on your age, health, family medical history, use of contact lenses, and other factors. Follow your caregiver's recommendations for frequency of eye exams.  Eat a healthy diet. Foods like vegetables, fruits, whole grains, low-fat dairy products, and lean protein foods contain the nutrients you need without too many calories. Decrease your intake of foods high in solid fats, added sugars, and salt. Eat the right amount of calories for you.Get information about a proper diet from your caregiver, if necessary.  Regular physical exercise is one of the most important things you can do for your health. Most adults should get at least 150 minutes of moderate-intensity exercise (any activity that increases your heart rate and causes you to sweat) each week. In addition, most adults need muscle-strengthening exercises on 2 or more days a week.  Maintain a healthy weight. The body mass index (BMI) is a screening tool to identify possible weight problems. It provides an estimate of body fat based on height and weight. Your caregiver can help determine your BMI, and can help you achieve or maintain a healthy weight.For adults 20 years and older:  A BMI below 18.5 is considered underweight.  A BMI of 18.5 to 24.9 is normal.  A BMI of 25 to 29.9 is considered overweight.  A BMI of 30 and above is  considered obese.  Maintain normal blood lipids and cholesterol levels by exercising and minimizing your intake of saturated fat. Eat a balanced diet with plenty of fruit and vegetables. Blood tests for lipids and cholesterol should begin at age 20 and be repeated every 5 years. If your lipid or cholesterol levels are high, you are over 50, or you are at high risk for heart disease, you may need your cholesterol levels checked more frequently.Ongoing high lipid and cholesterol levels should be treated with medicines if diet and exercise are not effective.  If you smoke, find out from your caregiver how to quit. If you do not use tobacco, do not start.  Lung cancer screening is recommended for adults aged 55 80 years who are at high risk for developing lung cancer because of a history of smoking. Yearly low-dose computed tomography (CT) is recommended for people who have at least a 30-pack-year history of smoking and are a current smoker or have quit within the past 15 years. A pack year of smoking is smoking an average of 1 pack of cigarettes a day for 1 year (for example: 1 pack a day for 30 years or 2 packs a day for 15 years). Yearly screening should continue until the smoker has stopped smoking for at least 15 years. Yearly screening should also be stopped for people who develop a health problem that would prevent them from having lung cancer treatment.  If you are pregnant, do not drink alcohol. If you are breastfeeding, be very cautious about drinking alcohol. If you are   not pregnant and choose to drink alcohol, do not exceed 1 drink per day. One drink is considered to be 12 ounces (355 mL) of beer, 5 ounces (148 mL) of wine, or 1.5 ounces (44 mL) of liquor.  Avoid use of street drugs. Do not share needles with anyone. Ask for help if you need support or instructions about stopping the use of drugs.  High blood pressure causes heart disease and increases the risk of stroke. Your blood pressure  should be checked at least every 1 to 2 years. Ongoing high blood pressure should be treated with medicines if weight loss and exercise are not effective.  If you are 55 to 39 years old, ask your caregiver if you should take aspirin to prevent strokes.  Diabetes screening involves taking a blood sample to check your fasting blood sugar level. This should be done once every 3 years, after age 45, if you are within normal weight and without risk factors for diabetes. Testing should be considered at a younger age or be carried out more frequently if you are overweight and have at least 1 risk factor for diabetes.  Breast cancer screening is essential preventive care for women. You should practice "breast self-awareness." This means understanding the normal appearance and feel of your breasts and may include breast self-examination. Any changes detected, no matter how small, should be reported to a caregiver. Women in their 20s and 30s should have a clinical breast exam (CBE) by a caregiver as part of a regular health exam every 1 to 3 years. After age 40, women should have a CBE every year. Starting at age 40, women should consider having a mammography (breast X-ray test) every year. Women who have a family history of breast cancer should talk to their caregiver about genetic screening. Women at a high risk of breast cancer should talk to their caregivers about having magnetic resonance imaging (MRI) and a mammography every year.  Breast cancer gene (BRCA)-related cancer risk assessment is recommended for women who have family members with BRCA-related cancers. BRCA-related cancers include breast, ovarian, tubal, and peritoneal cancers. Having family members with these cancers may be associated with an increased risk for harmful changes (mutations) in the breast cancer genes BRCA1 and BRCA2. Results of the assessment will determine the need for genetic counseling and BRCA1 and BRCA2 testing.  The Pap test is  a screening test for cervical cancer. A Pap test can show cell changes on the cervix that might become cervical cancer if left untreated. A Pap test is a procedure in which cells are obtained and examined from the lower end of the uterus (cervix).  Women should have a Pap test starting at age 21.  Between ages 21 and 29, Pap tests should be repeated every 2 years.  Beginning at age 30, you should have a Pap test every 3 years as long as the past 3 Pap tests have been normal.  Some women have medical problems that increase the chance of getting cervical cancer. Talk to your caregiver about these problems. It is especially important to talk to your caregiver if a new problem develops soon after your last Pap test. In these cases, your caregiver may recommend more frequent screening and Pap tests.  The above recommendations are the same for women who have or have not gotten the vaccine for human papillomavirus (HPV).  If you had a hysterectomy for a problem that was not cancer or a condition that could lead to cancer, then   you no longer need Pap tests. Even if you no longer need a Pap test, a regular exam is a good idea to make sure no other problems are starting.  If you are between ages 65 and 70, and you have had normal Pap tests going back 10 years, you no longer need Pap tests. Even if you no longer need a Pap test, a regular exam is a good idea to make sure no other problems are starting.  If you have had past treatment for cervical cancer or a condition that could lead to cancer, you need Pap tests and screening for cancer for at least 20 years after your treatment.  If Pap tests have been discontinued, risk factors (such as a new sexual partner) need to be reassessed to determine if screening should be resumed.  The HPV test is an additional test that may be used for cervical cancer screening. The HPV test looks for the virus that can cause the cell changes on the cervix. The cells collected  during the Pap test can be tested for HPV. The HPV test could be used to screen women aged 30 years and older, and should be used in women of any age who have unclear Pap test results. After the age of 30, women should have HPV testing at the same frequency as a Pap test.  Colorectal cancer can be detected and often prevented. Most routine colorectal cancer screening begins at the age of 50 and continues through age 75. However, your caregiver may recommend screening at an earlier age if you have risk factors for colon cancer. On a yearly basis, your caregiver may provide home test kits to check for hidden blood in the stool. Use of a small camera at the end of a tube, to directly examine the colon (sigmoidoscopy or colonoscopy), can detect the earliest forms of colorectal cancer. Talk to your caregiver about this at age 50, when routine screening begins. Direct examination of the colon should be repeated every 5 to 10 years through age 75, unless early forms of pre-cancerous polyps or small growths are found.  Hepatitis C blood testing is recommended for all people born from 1945 through 1965 and any individual with known risks for hepatitis C.  Practice safe sex. Use condoms and avoid high-risk sexual practices to reduce the spread of sexually transmitted infections (STIs). STIs include gonorrhea, chlamydia, syphilis, trichomonas, herpes, HPV, and human immunodeficiency virus (HIV). Herpes, HIV, and HPV are viral illnesses that have no cure. They can result in disability, cancer, and death. Sexually active women aged 25 and younger should be checked for chlamydia. Older women with new or multiple partners should also be tested for chlamydia. Testing for other STIs is recommended if you are sexually active and at increased risk.  Osteoporosis is a disease in which the bones lose minerals and strength with aging. This can result in serious bone fractures. The risk of osteoporosis can be identified using a  bone density scan. Women ages 65 and over and women at risk for fractures or osteoporosis should discuss screening with their caregivers. Ask your caregiver whether you should take a calcium supplement or vitamin D to reduce the rate of osteoporosis.  Menopause can be associated with physical symptoms and risks. Hormone replacement therapy is available to decrease symptoms and risks. You should talk to your caregiver about whether hormone replacement therapy is right for you.  Use sunscreen. Apply sunscreen liberally and repeatedly throughout the day. You should seek shade   when your shadow is shorter than you. Protect yourself by wearing long sleeves, pants, a wide-brimmed hat, and sunglasses year round, whenever you are outdoors.  Once a month, do a whole body skin exam, using a mirror to look at the skin on your back. Notify your caregiver of new moles, moles that have irregular borders, moles that are larger than a pencil eraser, or moles that have changed in shape or color.  Stay current with required immunizations.  Influenza vaccine. All adults should be immunized every year.  Tetanus, diphtheria, and acellular pertussis (Td, Tdap) vaccine. Pregnant women should receive 1 dose of Tdap vaccine during each pregnancy. The dose should be obtained regardless of the length of time since the last dose. Immunization is preferred during the 27th to 36th week of gestation. An adult who has not previously received Tdap or who does not know her vaccine status should receive 1 dose of Tdap. This initial dose should be followed by tetanus and diphtheria toxoids (Td) booster doses every 10 years. Adults with an unknown or incomplete history of completing a 3-dose immunization series with Td-containing vaccines should begin or complete a primary immunization series including a Tdap dose. Adults should receive a Td booster every 10 years.  Varicella vaccine. An adult without evidence of immunity to varicella  should receive 2 doses or a second dose if she has previously received 1 dose. Pregnant females who do not have evidence of immunity should receive the first dose after pregnancy. This first dose should be obtained before leaving the health care facility. The second dose should be obtained 4 8 weeks after the first dose.  Human papillomavirus (HPV) vaccine. Females aged 13 26 years who have not received the vaccine previously should obtain the 3-dose series. The vaccine is not recommended for use in pregnant females. However, pregnancy testing is not needed before receiving a dose. If a female is found to be pregnant after receiving a dose, no treatment is needed. In that case, the remaining doses should be delayed until after the pregnancy. Immunization is recommended for any person with an immunocompromised condition through the age of 26 years if she did not get any or all doses earlier. During the 3-dose series, the second dose should be obtained 4 8 weeks after the first dose. The third dose should be obtained 24 weeks after the first dose and 16 weeks after the second dose.  Zoster vaccine. One dose is recommended for adults aged 60 years or older unless certain conditions are present.  Measles, mumps, and rubella (MMR) vaccine. Adults born before 1957 generally are considered immune to measles and mumps. Adults born in 1957 or later should have 1 or more doses of MMR vaccine unless there is a contraindication to the vaccine or there is laboratory evidence of immunity to each of the three diseases. A routine second dose of MMR vaccine should be obtained at least 28 days after the first dose for students attending postsecondary schools, health care workers, or international travelers. People who received inactivated measles vaccine or an unknown type of measles vaccine during 1963 1967 should receive 2 doses of MMR vaccine. People who received inactivated mumps vaccine or an unknown type of mumps vaccine  before 1979 and are at high risk for mumps infection should consider immunization with 2 doses of MMR vaccine. For females of childbearing age, rubella immunity should be determined. If there is no evidence of immunity, females who are not pregnant should be vaccinated. If there   is no evidence of immunity, females who are pregnant should delay immunization until after pregnancy. Unvaccinated health care workers born before 1957 who lack laboratory evidence of measles, mumps, or rubella immunity or laboratory confirmation of disease should consider measles and mumps immunization with 2 doses of MMR vaccine or rubella immunization with 1 dose of MMR vaccine.  Pneumococcal 13-valent conjugate (PCV13) vaccine. When indicated, a person who is uncertain of her immunization history and has no record of immunization should receive the PCV13 vaccine. An adult aged 19 years or older who has certain medical conditions and has not been previously immunized should receive 1 dose of PCV13 vaccine. This PCV13 should be followed with a dose of pneumococcal polysaccharide (PPSV23) vaccine. The PPSV23 vaccine dose should be obtained at least 8 weeks after the dose of PCV13 vaccine. An adult aged 19 years or older who has certain medical conditions and previously received 1 or more doses of PPSV23 vaccine should receive 1 dose of PCV13. The PCV13 vaccine dose should be obtained 1 or more years after the last PPSV23 vaccine dose.  Pneumococcal polysaccharide (PPSV23) vaccine. When PCV13 is also indicated, PCV13 should be obtained first. All adults aged 65 years and older should be immunized. An adult younger than age 65 years who has certain medical conditions should be immunized. Any person who resides in a nursing home or long-term care facility should be immunized. An adult smoker should be immunized. People with an immunocompromised condition and certain other conditions should receive both PCV13 and PPSV23 vaccines. People  with human immunodeficiency virus (HIV) infection should be immunized as soon as possible after diagnosis. Immunization during chemotherapy or radiation therapy should be avoided. Routine use of PPSV23 vaccine is not recommended for American Indians, [place] Natives, or people younger than 65 years unless there are medical conditions that require PPSV23 vaccine. When indicated, people who have unknown immunization and have no record of immunization should receive PPSV23 vaccine. One-time revaccination 5 years after the first dose of PPSV23 is recommended for people aged 19 64 years who have chronic kidney failure, nephrotic syndrome, asplenia, or immunocompromised conditions. People who received 1 2 doses of PPSV23 before age 65 years should receive another dose of PPSV23 vaccine at age 65 years or later if at least 5 years have passed since the previous dose. Doses of PPSV23 are not needed for people immunized with PPSV23 at or after age 65 years.  Meningococcal vaccine. Adults with asplenia or persistent complement component deficiencies should receive 2 doses of quadrivalent meningococcal conjugate (MenACWY-D) vaccine. The doses should be obtained at least 2 months apart. Microbiologists working with certain meningococcal bacteria, military recruits, people at risk during an outbreak, and people who travel to or live in countries with a high rate of meningitis should be immunized. A first-year college student up through age 21 years who is living in a residence hall should receive a dose if she did not receive a dose on or after her 16th birthday. Adults who have certain high-risk conditions should receive one or more doses of vaccine.  Hepatitis A vaccine. Adults who wish to be protected from this disease, have certain high-risk conditions, work with hepatitis A-infected animals, work in hepatitis A research labs, or travel to or work in countries with a high rate of hepatitis A should be immunized. Adults  who were previously unvaccinated and who anticipate close contact with an international adoptee during the first 60 days after arrival in the [place] from a country   with a high rate of hepatitis A should be immunized.  Hepatitis B vaccine. Adults who wish to be protected from this disease, have certain high-risk conditions, may be exposed to blood or other infectious body fluids, are household contacts or sex partners of hepatitis B positive people, are clients or workers in certain care facilities, or travel to or work in countries with a high rate of hepatitis B should be immunized.  Haemophilus influenzae type b (Hib) vaccine. A previously unvaccinated person with asplenia or sickle cell disease or having a scheduled splenectomy should receive 1 dose of Hib vaccine. Regardless of previous immunization, a recipient of a hematopoietic stem cell transplant should receive a 3-dose series 6 12 months after her successful transplant. Hib vaccine is not recommended for adults with HIV infection. Preventive Services / Frequency Ages 19 to 39  Blood pressure check.** / Every 1 to 2 years.  Lipid and cholesterol check.** / Every 5 years beginning at age 20.  Clinical breast exam.** / Every 3 years for women in their 20s and 30s.  BRCA-related cancer risk assessment.** / For women who have family members with a BRCA-related cancer (breast, ovarian, tubal, or peritoneal cancers).  Pap test.** / Every 2 years from ages 21 through 29. Every 3 years starting at age 30 through age 65 or 70 with a history of 3 consecutive normal Pap tests.  HPV screening.** / Every 3 years from ages 30 through ages 65 to 70 with a history of 3 consecutive normal Pap tests.  Hepatitis C blood test.** / For any individual with known risks for hepatitis C.  Skin self-exam. / Monthly.  Influenza vaccine. / Every year.  Tetanus, diphtheria, and acellular pertussis (Tdap, Td) vaccine.** / Consult your caregiver. Pregnant  women should receive 1 dose of Tdap vaccine during each pregnancy. 1 dose of Td every 10 years.  Varicella vaccine.** / Consult your caregiver. Pregnant females who do not have evidence of immunity should receive the first dose after pregnancy.  HPV vaccine. / 3 doses over 6 months, if 26 and younger. The vaccine is not recommended for use in pregnant females. However, pregnancy testing is not needed before receiving a dose.  Measles, mumps, rubella (MMR) vaccine.** / You need at least 1 dose of MMR if you were born in 1957 or later. You may also need a 2nd dose. For females of childbearing age, rubella immunity should be determined. If there is no evidence of immunity, females who are not pregnant should be vaccinated. If there is no evidence of immunity, females who are pregnant should delay immunization until after pregnancy.  Pneumococcal 13-valent conjugate (PCV13) vaccine.** / Consult your caregiver.  Pneumococcal polysaccharide (PPSV23) vaccine.** / 1 to 2 doses if you smoke cigarettes or if you have certain conditions.  Meningococcal vaccine.** / 1 dose if you are age 19 to 21 years and a first-year college student living in a residence hall, or have one of several medical conditions, you need to get vaccinated against meningococcal disease. You may also need additional booster doses.  Hepatitis A vaccine.** / Consult your caregiver.  Hepatitis B vaccine.** / Consult your caregiver.  Haemophilus influenzae type b (Hib) vaccine.** / Consult your caregiver. Ages 40 to 64  Blood pressure check.** / Every 1 to 2 years.  Lipid and cholesterol check.** / Every 5 years beginning at age 20.  Lung cancer screening. / Every year if you are aged 55 80 years and have a 30-pack-year history of smoking and   currently smoke or have quit within the past 15 years. Yearly screening is stopped once you have quit smoking for at least 15 years or develop a health problem that would prevent you from having  lung cancer treatment.  Clinical breast exam.** / Every year after age 40.  BRCA-related cancer risk assessment.** / For women who have family members with a BRCA-related cancer (breast, ovarian, tubal, or peritoneal cancers).  Mammogram.** / Every year beginning at age 40 and continuing for as long as you are in good health. Consult with your caregiver.  Pap test.** / Every 3 years starting at age 30 through age 65 or 70 with a history of 3 consecutive normal Pap tests.  HPV screening.** / Every 3 years from ages 30 through ages 65 to 70 with a history of 3 consecutive normal Pap tests.  Fecal occult blood test (FOBT) of stool. / Every year beginning at age 50 and continuing until age 75. You may not need to do this test if you get a colonoscopy every 10 years.  Flexible sigmoidoscopy or colonoscopy.** / Every 5 years for a flexible sigmoidoscopy or every 10 years for a colonoscopy beginning at age 50 and continuing until age 75.  Hepatitis C blood test.** / For all people born from 1945 through 1965 and any individual with known risks for hepatitis C.  Skin self-exam. / Monthly.  Influenza vaccine. / Every year.  Tetanus, diphtheria, and acellular pertussis (Tdap/Td) vaccine.** / Consult your caregiver. Pregnant women should receive 1 dose of Tdap vaccine during each pregnancy. 1 dose of Td every 10 years.  Varicella vaccine.** / Consult your caregiver. Pregnant females who do not have evidence of immunity should receive the first dose after pregnancy.  Zoster vaccine.** / 1 dose for adults aged 60 years or older.  Measles, mumps, rubella (MMR) vaccine.** / You need at least 1 dose of MMR if you were born in 1957 or later. You may also need a 2nd dose. For females of childbearing age, rubella immunity should be determined. If there is no evidence of immunity, females who are not pregnant should be vaccinated. If there is no evidence of immunity, females who are pregnant should delay  immunization until after pregnancy.  Pneumococcal 13-valent conjugate (PCV13) vaccine.** / Consult your caregiver.  Pneumococcal polysaccharide (PPSV23) vaccine.** / 1 to 2 doses if you smoke cigarettes or if you have certain conditions.  Meningococcal vaccine.** / Consult your caregiver.  Hepatitis A vaccine.** / Consult your caregiver.  Hepatitis B vaccine.** / Consult your caregiver.  Haemophilus influenzae type b (Hib) vaccine.** / Consult your caregiver. Ages 65 and over  Blood pressure check.** / Every 1 to 2 years.  Lipid and cholesterol check.** / Every 5 years beginning at age 20.  Lung cancer screening. / Every year if you are aged 55 80 years and have a 30-pack-year history of smoking and currently smoke or have quit within the past 15 years. Yearly screening is stopped once you have quit smoking for at least 15 years or develop a health problem that would prevent you from having lung cancer treatment.  Clinical breast exam.** / Every year after age 40.  BRCA-related cancer risk assessment.** / For women who have family members with a BRCA-related cancer (breast, ovarian, tubal, or peritoneal cancers).  Mammogram.** / Every year beginning at age 40 and continuing for as long as you are in good health. Consult with your caregiver.  Pap test.** / Every 3 years starting at age   30 through age 65 or 70 with a 3 consecutive normal Pap tests. Testing can be stopped between 65 and 70 with 3 consecutive normal Pap tests and no abnormal Pap or HPV tests in the past 10 years.  HPV screening.** / Every 3 years from ages 30 through ages 65 or 70 with a history of 3 consecutive normal Pap tests. Testing can be stopped between 65 and 70 with 3 consecutive normal Pap tests and no abnormal Pap or HPV tests in the past 10 years.  Fecal occult blood test (FOBT) of stool. / Every year beginning at age 50 and continuing until age 75. You may not need to do this test if you get a colonoscopy  every 10 years.  Flexible sigmoidoscopy or colonoscopy.** / Every 5 years for a flexible sigmoidoscopy or every 10 years for a colonoscopy beginning at age 50 and continuing until age 75.  Hepatitis C blood test.** / For all people born from 1945 through 1965 and any individual with known risks for hepatitis C.  Osteoporosis screening.** / A one-time screening for women ages 65 and over and women at risk for fractures or osteoporosis.  Skin self-exam. / Monthly.  Influenza vaccine. / Every year.  Tetanus, diphtheria, and acellular pertussis (Tdap/Td) vaccine.** / 1 dose of Td every 10 years.  Varicella vaccine.** / Consult your caregiver.  Zoster vaccine.** / 1 dose for adults aged 60 years or older.  Pneumococcal 13-valent conjugate (PCV13) vaccine.** / Consult your caregiver.  Pneumococcal polysaccharide (PPSV23) vaccine.** / 1 dose for all adults aged 65 years and older.  Meningococcal vaccine.** / Consult your caregiver.  Hepatitis A vaccine.** / Consult your caregiver.  Hepatitis B vaccine.** / Consult your caregiver.  Haemophilus influenzae type b (Hib) vaccine.** / Consult your caregiver. ** Family history and personal history of risk and conditions may change your caregiver's recommendations. Document Released: 10/23/2001 Document Revised: 12/22/2012 Document Reviewed: 01/22/2011 [employer] Patient Information 2014 [employer].  

## 2013-08-18 NOTE — Assessment & Plan Note (Signed)
Check labs Consider derm referral 

## 2013-08-18 NOTE — Progress Notes (Signed)
Pre visit review using our clinic review tool, if applicable. No additional management support is needed unless otherwise documented below in the visit note. 

## 2013-08-18 NOTE — Progress Notes (Signed)
Subjective:     Zoe Briggs is a 37 y.o. female and is here for a comprehensive physical exam. The patient reports problems - hair loss.  History   Social History  . Marital Status: Married    Spouse Name: N/A    Number of Children: 1  . Years of Education: N/A   Occupational History  . [employer]     Social History Main Topics  . Smoking status: Never Smoker   . Smokeless tobacco: Never Used  . Alcohol Use: Yes     Comment: Occ  . Drug Use: No  . Sexual Activity: Yes    Partners: Male   Other Topics Concern  . Not on file   Social History Narrative   Exercise-- 4 days a week   Health Maintenance  Topic Date Due  . Influenza Vaccine  08/18/2014  . Pap Smear  08/18/2014  . Tetanus/tdap  09/10/2014    The following portions of the patient's history were reviewed and updated as appropriate:  She  has a past medical history of Asthma; Depression; Allergy; Migraines; IC (interstitial cystitis); and Colon cancer (2005). She  does not have any pertinent problems on file. She  has past surgical history that includes Colon surgery and Tonsillectomy and adenoidectomy. Her family history includes Arthritis in her mother; Cancer in her paternal aunt and paternal grandfather; Heart disease in her mother; Hypertension in her mother. She  reports that she has never smoked. She has never used smokeless tobacco. She reports that she drinks alcohol. She reports that she does not use illicit drugs. She has a current medication list which includes the following prescription(s): cetirizine, citalopram, estradiol-norethindrone, fexofenadine, lansoprazole, levalbuterol, multivitamin, pentosan polysulfate, topiramate, and tramadol. Current Outpatient Prescriptions on File Prior to Visit  Medication Sig Dispense Refill  . citalopram (CELEXA) 20 MG tablet 1 tab by mouth daily--office visit due now  90 tablet  0  . estradiol-norethindrone (COMBIPATCH) 0.05-0.14 MG/DAY Place 1 patch  onto the skin every 3 (three) days.  8 patch  11  . fexofenadine (ALLEGRA) 180 MG tablet Take 180 mg by mouth daily.      . lansoprazole (PREVACID) 30 MG capsule Take 1 capsule (30 mg total) by mouth daily.  30 capsule  5  . levalbuterol (XOPENEX HFA) 45 MCG/ACT inhaler Inhale 2 puffs into the lungs every 8 (eight) hours as needed for wheezing.  1 Inhaler  1  . Multiple Vitamin (MULTIVITAMIN) tablet Take 1 tablet by mouth daily.      . pentosan polysulfate (ELMIRON) 100 MG capsule Take 100 mg by mouth 3 (three) times daily before meals.      . topiramate (TOPAMAX) 50 MG tablet Take 1 tablet (50 mg total) by mouth daily.  30 tablet  11  . traMADol (ULTRAM) 50 MG tablet TAKE ONE TABLET BY MOUTH EVERY 6 HOURS AS NEEDED.  40 tablet  0   No current facility-administered medications on file prior to visit.   She is allergic to ciprofloxacin; sulfa antibiotics; imitrex; and iodinated diagnostic agents..  Review of Systems Review of Systems  Constitutional: Negative for activity change, appetite change and fatigue.  HENT: Negative for hearing loss, congestion, tinnitus and ear discharge.  dentist q39m Eyes: Negative for visual disturbance (see optho q1y -- vision corrected to 20/20 with glasses).  Respiratory: Negative for cough, chest tightness and shortness of breath.   Cardiovascular: Negative for chest pain, palpitations and leg swelling.  Gastrointestinal: Negative for abdominal pain, diarrhea, constipation  and abdominal distention.  Genitourinary: Negative for urgency, frequency, decreased urine volume and difficulty urinating.  Musculoskeletal: Negative for back pain, arthralgias and gait problem.  Skin: Negative for color change, pallor and rash.  Neurological: Negative for dizziness, light-headedness, numbness and headaches.  Hematological: Negative for adenopathy. Does not bruise/bleed easily.  Psychiatric/Behavioral: Negative for suicidal ideas, confusion, sleep disturbance, self-injury,  dysphoric mood, decreased concentration and agitation.       Objective:    BP 92/70  Pulse 91  Temp(Src) 98.2 F (36.8 C) (Oral)  Ht 5' 10.5" (1.791 m)  Wt 148 lb (67.132 kg)  BMI 20.93 kg/m2  SpO2 99% General appearance: alert, cooperative, appears stated age and no distress Head: Normocephalic, without obvious abnormality, atraumatic Eyes: conjunctivae/corneas clear. PERRL, EOM's intact. Fundi benign. Ears: normal TM's and external ear canals both ears Nose: Nares normal. Septum midline. Mucosa normal. No drainage or sinus tenderness. Throat: lips, mucosa, and tongue normal; teeth and gums normal Neck: no adenopathy, no carotid bruit, no JVD, supple, symmetrical, trachea midline and thyroid not enlarged, symmetric, no tenderness/mass/nodules Back: symmetric, no curvature. ROM normal. No CVA tenderness. Lungs: clear to auscultation bilaterally Breasts: gyn Heart: regular rate and rhythm, S1, S2 normal, no murmur, click, rub or gallop Abdomen: soft, non-tender; bowel sounds normal; no masses,  no organomegaly Pelvic: deferred and sees gyn Extremities: extremities normal, atraumatic, no cyanosis or edema Pulses: 2+ and symmetric Skin: Skin color, texture, turgor normal. No rashes or lesions Lymph nodes: Cervical, supraclavicular, and axillary nodes normal. Neurologic: Alert and oriented X 3, normal strength and tone. Normal symmetric reflexes. Normal coordination and gait Psych--no depression, no anxiety      Assessment:    Healthy female exam.      Plan:    check fasting labs See aVS See After Visit Summary for Counseling Recommendations

## 2013-08-19 LAB — LDL CHOLESTEROL, DIRECT: Direct LDL: 107.7 mg/dL

## 2013-08-21 LAB — VITAMIN D 1,25 DIHYDROXY
Vitamin D 1, 25 (OH)2 Total: 51 pg/mL (ref 18–72)
Vitamin D2 1, 25 (OH)2: <8 pg/mL
Vitamin D3 1, 25 (OH)2: 51 pg/mL

## 2013-08-24 LAB — THYROID PANEL WITH TSH
Free Thyroxine Index: 2.3 (ref 1.0–3.9)
T3 Uptake: 33.6 % (ref 22.5–37.0)
T4, Total: 6.8 ug/dL (ref 5.0–12.5)

## 2013-08-25 ENCOUNTER — Ambulatory Visit: Payer: BC Managed Care – PPO | Admitting: Podiatry

## 2013-08-26 ENCOUNTER — Telehealth: Payer: Self-pay

## 2013-08-26 DIAGNOSIS — G43909 Migraine, unspecified, not intractable, without status migrainosus: Secondary | ICD-10-CM

## 2013-08-26 NOTE — Telephone Encounter (Signed)
Last seen 08/18/13 and filled 07/21/13 #40. Please advise      KP

## 2013-08-26 NOTE — Telephone Encounter (Signed)
Refill #30---  Pt due ov

## 2013-08-27 MED ORDER — TRAMADOL HCL 50 MG PO TABS: ORAL_TABLET | ORAL | Status: DC

## 2013-08-27 NOTE — Telephone Encounter (Signed)
No-- i miss read the date

## 2013-08-27 NOTE — Telephone Encounter (Signed)
CPE was 08/18/13. Do you want her to come back in???

## 2013-09-15 ENCOUNTER — Ambulatory Visit (INDEPENDENT_AMBULATORY_CARE_PROVIDER_SITE_OTHER): Payer: BC Managed Care – PPO | Admitting: Podiatry

## 2013-09-15 ENCOUNTER — Encounter: Payer: Self-pay | Admitting: Podiatry

## 2013-09-15 VITALS — BP 114/74 | HR 72 | Resp 16 | Ht 70.0 in | Wt 145.0 lb

## 2013-09-15 DIAGNOSIS — L600 Ingrowing nail: Secondary | ICD-10-CM

## 2013-09-15 MED ORDER — NEOMYCIN-POLYMYXIN-HC 3.5-10000-1 OT SOLN: OTIC | Status: DC

## 2013-09-15 NOTE — Progress Notes (Signed)
   Subjective:    Patient ID: Zoe Briggs, female    DOB: 10-Apr-1976, 38 y.o.   MRN: [id number]  HPI Comments: Pt states ingrown toenail left great toenail medial corner, the toenail has fallen off before and thought that it had just grown back funny but for the  Last 6 months it has been hurting, tried soaking the toe and clipping it and it would just throb .      Review of Systems     Objective:   Physical Exam: I have reviewed her past medical history medications allergies surgery history and social history. Review of systems is remarkable for colon cancer chemotherapy with radiation. Pulses are strongly palpable to the bilateral lower extremity. Neurologic sensorium is intact per since once the monofilament bilateral. Deep tendon reflexes are brisk and equal bilateral. Muscle strength + over 5 dorsiflexors plantar flexors inverters everters all intrinsic musculature is intact. 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 Cutaneous evaluation demonstrates supple well hydrated cutis erythema and edema along the tibial border of the hallux left is indicative of an ingrown nail paronychia abscess to the hallux left.        Assessment & Plan:  Assessment: Ingrown nail paronychia abscess hallux left.  Plan: Chemical matrixectomy was performed today under local anesthetic utilizing phenol. She was given both oral and written home-going instructions for the care of this toe. She will start soaking tomorrow and utilize Cortisporin Otic and I will followup with her in one week. We did discuss the need for surgical correction of her bunion at some point in time. She understands this and is amenable to it.

## 2013-09-15 NOTE — Patient Instructions (Signed)

## 2013-09-16 ENCOUNTER — Telehealth: Payer: Self-pay | Admitting: *Deleted

## 2013-09-16 DIAGNOSIS — G43909 Migraine, unspecified, not intractable, without status migrainosus: Secondary | ICD-10-CM

## 2013-09-16 MED ORDER — TRAMADOL HCL 50 MG PO TABS: ORAL_TABLET | ORAL | Status: DC

## 2013-09-16 NOTE — Telephone Encounter (Signed)
Last seen 08/18/13 and filled 08/27/13 #30.  Patient requested to increase quantity.   Please advise     KP

## 2013-09-16 NOTE — Telephone Encounter (Signed)
Ok to increase to #60  2 refills

## 2013-09-16 NOTE — Telephone Encounter (Signed)
Patient called and stated that she would like a refill on traMADol (ULTRAM) 50 MG tablet but if dr Etter Sjogren could increase the quantity because she has been taking up to 1 1/2-2 pills a day.

## 2013-09-16 NOTE — Telephone Encounter (Signed)
Patient aware. Rx being faxed     KP 

## 2013-09-22 ENCOUNTER — Encounter: Payer: Self-pay | Admitting: Family Medicine

## 2013-09-22 ENCOUNTER — Ambulatory Visit: Payer: BC Managed Care – PPO | Admitting: Podiatry

## 2013-09-22 ENCOUNTER — Ambulatory Visit (INDEPENDENT_AMBULATORY_CARE_PROVIDER_SITE_OTHER): Payer: BC Managed Care – PPO | Admitting: Family Medicine

## 2013-09-22 VITALS — BP 116/78 | HR 98 | Temp 98.4°F | Wt 148.0 lb

## 2013-09-22 DIAGNOSIS — J019 Acute sinusitis, unspecified: Secondary | ICD-10-CM

## 2013-09-22 MED ORDER — CEFDINIR 300 MG PO CAPS: 300.0000 mg | ORAL_CAPSULE | Freq: Two times a day (BID) | ORAL | Status: DC

## 2013-09-22 NOTE — Patient Instructions (Signed)
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
## 2013-09-24 ENCOUNTER — Encounter: Payer: Self-pay | Admitting: Podiatry

## 2013-09-24 ENCOUNTER — Ambulatory Visit (INDEPENDENT_AMBULATORY_CARE_PROVIDER_SITE_OTHER): Payer: BC Managed Care – PPO | Admitting: Podiatry

## 2013-09-24 VITALS — BP 105/64 | HR 74 | Resp 12

## 2013-09-24 DIAGNOSIS — Z9889 Other specified postprocedural states: Secondary | ICD-10-CM

## 2013-09-24 NOTE — Progress Notes (Signed)
   Subjective:    Patient ID: Zoe Briggs, female    DOB: 10/30/1975, 38 y.o.   MRN: [id number]  HPI Comments: '' LT FOOT GREAT TOPENAIL IS DOING MUCH BETTER.''     Review of Systems     Objective:   Physical Exam: Vital signs are stable she is alert and oriented x3. Pulses are palpable left lower extremity. Hallux demonstrates no erythema edema cellulitis drainage or odor. Margin appears to be healing quite nicely from the matrixectomy performed last week.        Assessment & Plan:  Assessment: Well-healing surgical toe hallux left.  Plan: Continue to soak in Epsom salts and water for the next week or 2 until there is no longer redness pain or drainage. She's to cover during the day and leave open at night. She will continue Cortisporin Otic as directed.

## 2013-10-12 ENCOUNTER — Encounter: Payer: Self-pay | Admitting: Family Medicine

## 2013-10-16 ENCOUNTER — Telehealth: Payer: Self-pay | Admitting: *Deleted

## 2013-10-16 NOTE — Telephone Encounter (Signed)
Please advise      KP 

## 2013-10-16 NOTE — Telephone Encounter (Signed)
Its been more than 2 weeks he would have to be seen--- we can't give drops unless we can see the ear He can use nasacort and antihistamine otc

## 2013-10-16 NOTE — Telephone Encounter (Signed)
Patient called and stated that she was seen for a sinus infection 2 weeks ago and now its moved into her ear. She wanted to see if dr Etter Sjogren will prescribe some medicine for her ear.   Pharmacy [facility] [place], [place].

## 2013-10-16 NOTE — Telephone Encounter (Signed)
Detailed message left on VM advising office visit is due.      KP

## 2013-12-03 ENCOUNTER — Telehealth: Payer: Self-pay | Admitting: *Deleted

## 2013-12-03 NOTE — Telephone Encounter (Signed)
I go ta toenail taken out in January.  Where removed the nail, the toenail is dark in the corner.  Is that normal?  It's hard too.  Do I need to take something for it?  I asked her if it was scab from the procedure. She stated no, it was getting dark underneath the nail.  I told her she needed to schedule an appointment for follow up.  I transferred her to a scheduler.

## 2013-12-08 ENCOUNTER — Ambulatory Visit: Payer: BC Managed Care – PPO | Admitting: Podiatry

## 2013-12-17 ENCOUNTER — Other Ambulatory Visit: Payer: Self-pay | Admitting: Family Medicine

## 2013-12-18 ENCOUNTER — Telehealth: Payer: Self-pay | Admitting: Family Medicine

## 2013-12-18 DIAGNOSIS — F329 Major depressive disorder, single episode, unspecified: Secondary | ICD-10-CM

## 2013-12-18 DIAGNOSIS — F32A Depression, unspecified: Secondary | ICD-10-CM

## 2013-12-18 NOTE — Telephone Encounter (Signed)
Patient called and stated that she would like for Dr Etter Sjogren to up the dosage on her citalopram (CELEXA) 20 MG tablet. Patient states that since she has been taking it for so long that its really not doing anything for her. Please advise.

## 2013-12-21 MED ORDER — CITALOPRAM HYDROBROMIDE 40 MG PO TABS: 40.0000 mg | ORAL_TABLET | Freq: Every day | ORAL | Status: DC

## 2013-12-21 NOTE — Telephone Encounter (Signed)
Spoke with patient who advises that her current dose of celexa is not longer working well and would like to increase it. Please advise on change of current dose of 20 mg daily.

## 2013-12-21 NOTE — Telephone Encounter (Signed)
Patient made aware of medication dose increase and to follow up in 6-8 weeks. Appt made for 02/08/14 at 1:15.

## 2013-12-21 NOTE — Telephone Encounter (Signed)
Increase to 40mg   #30  2 refills ---  Ov in 6-8 weeks to f/u

## 2013-12-22 ENCOUNTER — Ambulatory Visit: Payer: BC Managed Care – PPO | Admitting: Podiatry

## 2013-12-24 ENCOUNTER — Telehealth: Payer: Self-pay | Admitting: Family Medicine

## 2013-12-24 DIAGNOSIS — G43909 Migraine, unspecified, not intractable, without status migrainosus: Secondary | ICD-10-CM

## 2013-12-24 MED ORDER — CITALOPRAM HYDROBROMIDE 20 MG PO TABS: 30.0000 mg | ORAL_TABLET | Freq: Every day | ORAL | Status: DC

## 2013-12-24 MED ORDER — TRAMADOL HCL 50 MG PO TABS: ORAL_TABLET | ORAL | Status: DC

## 2013-12-24 NOTE — Telephone Encounter (Signed)
Patient is having headaches and wanted to know what she should do. Please advise      KP

## 2013-12-24 NOTE — Telephone Encounter (Signed)
If it her reg migraines and they are worsening-- refer to neuro Refill tramadol Ok to -- dec celexa 20 mg  1 1/2 tab po qd  #45   2 refills

## 2013-12-24 NOTE — Telephone Encounter (Signed)
Last seen 09/22/13 and filled 09/16/13 #60 with 2 refills. UDS 08/19/13   KP

## 2013-12-24 NOTE — Telephone Encounter (Signed)
Dicussed with patient and she voiced understanding. Ref in and Rx faxed      KP

## 2013-12-24 NOTE — Telephone Encounter (Signed)
Patient called requesting a refill for traMADol (ULTRAM) 50 MG tablet and also she states that her Celexa is giving her a headache can we bring the dosage down to 30mg . Please advise.

## 2014-01-04 ENCOUNTER — Encounter: Payer: Self-pay | Admitting: Family Medicine

## 2014-01-04 ENCOUNTER — Ambulatory Visit (INDEPENDENT_AMBULATORY_CARE_PROVIDER_SITE_OTHER): Payer: BC Managed Care – PPO | Admitting: Family Medicine

## 2014-01-04 VITALS — BP 100/68 | HR 97 | Temp 98.3°F | Wt 148.0 lb

## 2014-01-04 DIAGNOSIS — J309 Allergic rhinitis, unspecified: Secondary | ICD-10-CM

## 2014-01-04 DIAGNOSIS — J302 Other seasonal allergic rhinitis: Secondary | ICD-10-CM

## 2014-01-04 DIAGNOSIS — F32A Depression, unspecified: Secondary | ICD-10-CM

## 2014-01-04 DIAGNOSIS — F329 Major depressive disorder, single episode, unspecified: Secondary | ICD-10-CM

## 2014-01-04 DIAGNOSIS — F3289 Other specified depressive episodes: Secondary | ICD-10-CM

## 2014-01-04 MED ORDER — TOPIRAMATE 25 MG PO TABS: 25.0000 mg | ORAL_TABLET | Freq: Every day | ORAL | Status: DC

## 2014-01-04 MED ORDER — MONTELUKAST SODIUM 10 MG PO TABS: 10.0000 mg | ORAL_TABLET | Freq: Every day | ORAL | Status: DC

## 2014-01-04 MED ORDER — CITALOPRAM HYDROBROMIDE 10 MG PO TABS: ORAL_TABLET | ORAL | Status: DC

## 2014-01-04 NOTE — Progress Notes (Signed)
   Subjective:    Patient ID: Zoe Briggs, female    DOB: 1976/04/10, 38 y.o.   MRN: [id number]  HPI Pt here f/u depression---doing well with 30 mg celexa.  Pt would like to be put back on singulair.     Review of Systems As above    Objective:   Physical Exam  BP 100/68  Pulse 97  Temp(Src) 98.3 F (36.8 C) (Oral)  Wt 148 lb (67.132 kg)  SpO2 98% General appearance: alert, cooperative, appears stated age and no distress Neck: no adenopathy, supple, symmetrical, trachea midline and thyroid not enlarged, symmetric, no tenderness/mass/nodules Lungs: clear to auscultation bilaterally Heart: regular rate and rhythm, S1, S2 normal, no murmur, click, rub or gallop Neurologic: Alert and oriented X 3, normal strength and tone. Normal symmetric reflexes. Normal coordination and gait      Assessment & Plan:  1. Depression Stable, con't meds  - citalopram (CELEXA) 10 MG tablet; Take 3 po qd  Dispense: 90 tablet; Refill: 11  2. Seasonal allergies Cont antihistamine and nasal spray - montelukast (SINGULAIR) 10 MG tablet; Take 1 tablet (10 mg total) by mouth at bedtime.  Dispense: 30 tablet; Refill: 3

## 2014-01-04 NOTE — Patient Instructions (Signed)

## 2014-01-04 NOTE — Progress Notes (Signed)
Pre visit review using our clinic review tool, if applicable. No additional management support is needed unless otherwise documented below in the visit note. 

## 2014-01-04 NOTE — Progress Notes (Deleted)
   Subjective:    Patient ID: Zoe Briggs, female    DOB: September 16, 1975, 38 y.o.   MRN: [id number]  HPI Pt here f/u pancreatitis and have fmla filled out--she needs date extended--- we will extend until February 15, 2014.     Review of Systems As above    Objective:   Physical Exam  BP 100/68  Pulse 97  Temp(Src) 98.3 F (36.8 C) (Oral)  Wt 148 lb (67.132 kg)  SpO2 98% General appearance: alert, cooperative, appears stated age and no distress Neck: no adenopathy, supple, symmetrical, trachea midline and thyroid not enlarged, symmetric, no tenderness/mass/nodules Lungs: clear to auscultation bilaterally Heart: regular rate and rhythm, S1, S2 normal, no murmur, click, rub or gallop Neurologic: Alert and oriented X 3, normal strength and tone. Normal symmetric reflexes. Normal coordination and gait abd- + abd pain Left mid abd     Assessment & Plan:  1. Depression  much better  - citalopram (CELEXA) 10 MG tablet; Take 3 po qd  Dispense: 90 tablet; Refill: 11  2. Seasonal allergies con't antihistamine, and steroid nasal spray - montelukast (SINGULAIR) 10 MG tablet; Take 1 tablet (10 mg total) by mouth at bedtime.  Dispense: 30 tablet; Refill: 3  3. Pancreatitis--  Per GI

## 2014-01-22 ENCOUNTER — Encounter: Payer: Self-pay | Admitting: Neurology

## 2014-01-22 ENCOUNTER — Ambulatory Visit (INDEPENDENT_AMBULATORY_CARE_PROVIDER_SITE_OTHER): Payer: BC Managed Care – PPO | Admitting: Neurology

## 2014-01-22 VITALS — BP 110/70 | HR 71 | Ht 70.0 in | Wt 144.4 lb

## 2014-01-22 DIAGNOSIS — G43009 Migraine without aura, not intractable, without status migrainosus: Secondary | ICD-10-CM

## 2014-01-22 DIAGNOSIS — G4440 Drug-induced headache, not elsewhere classified, not intractable: Secondary | ICD-10-CM

## 2014-01-22 MED ORDER — NORTRIPTYLINE HCL 10 MG PO CAPS: 10.0000 mg | ORAL_CAPSULE | Freq: Every day | ORAL | Status: DC

## 2014-01-22 NOTE — Patient Instructions (Addendum)
1.  We will start nortriptyline 10mg  at bedtime for headache prevention.  Side effects may include sleepiness, dizziness, dry mouth. 2.  Take 1/2 a Topamax daily for one week and then stop. 3.  Try to cut out caffeine. 4.  Stay hydrated. 5.  You may continue tramadol for a headache, but must limit use to no more than 1 or 2 days out of the week to prevent rebound headache (this goes for all pain relievers) 6.  To help go to sleep, read (not watch TV) before you go to bed.  If you still can't fall asleep, get up, go to a quiet room and read a book via lamp light until you feel sleepy. 7.  Call in 4 weeks with update.  Follow up in 3 months. There is a small risk for having a seizure when taking nortriptyline and tramadol, but you will not be taking the tramadol on a regular basis anymore.

## 2014-01-22 NOTE — Progress Notes (Signed)
NEUROLOGY FOLLOW UP OFFICE NOTE  Zoe Briggs [id number]  HISTORY OF PRESENT ILLNESS: Zoe Briggs is a 38 year old right-handed woman with history of colon cancer (status post surgery, radiation, chemotherapy 2005), GERD, seasonal asthma, early menopause, depression who presents for headache..  Records and images were personally reviewed where available.    Onset:  2008 (thought to be triggered by mold growing in an old building she was working in 2007). Location:  Bifrontal Quality:  Throbbing Intensity:  10 out of 10 Aura:  No Prodrome:  No Associated symptoms:  The used to be associated with nausea, vomiting, blurred vision, photophobia, and phonophobia. However, she has not experienced these symptoms since taking tramadol daily for the past 3 years. Duration:  2-3 days Frequency:  4 times a month (approximately 10 headache days per month). They occur more frequently during changes in season. Triggers/exacerbating factors:  Hunger, thirst, change in season, wheat Relieving factors:  Relaxing Activity:  Able to force self to function  Past abortive therapy:  Midrin (possibly effective), Imitrex (allergic reaction involving rash on her neck), naproxen (aggravated GERD), Excedrin migraine (exacerbated GERD). Past preventative therapy:  None  Current abortive therapy:  Tramadol 50 mg daily, will take either another tramadol or and Advil if the headaches are worse. Current preventative therapy:  Topamax 25mg  (higher doses caused cognitive clouding). Other medications:  citalopram 10mg , Combipatch, prevacid, Allegra vs Zyrtec  Caffeine:  3 cups of coffee daily Alcohol:  No Smoker:  No Diet:  Good. Eats a lot of chicken, [place], fish, vegetables. Drinks plenty of water. Exercise:  5 days per week Depression/stress:  Controlled. Sleep hygiene:  Poor. Has difficulty falling asleep. Prior history of headache: No. Occasional mild headaches once in a while. Family history of  headache:  No.  PAST MEDICAL HISTORY: Past Medical History  Diagnosis Date  . Asthma     Seasonal  . Depression   . Allergy   . Migraines   . IC (interstitial cystitis)   . Colon cancer 2005    MEDICATIONS: Current Outpatient Prescriptions on File Prior to Visit  Medication Sig Dispense Refill  . cetirizine (ZYRTEC) 10 MG tablet Take 10 mg by mouth daily.      . citalopram (CELEXA) 10 MG tablet Take 3 po qd  90 tablet  11  . estradiol-norethindrone (COMBIPATCH) 0.05-0.14 MG/DAY Place 1 patch onto the skin every 3 (three) days.  8 patch  11  . fexofenadine (ALLEGRA) 180 MG tablet Take 180 mg by mouth daily.      . lansoprazole (PREVACID) 30 MG capsule Take 1 capsule (30 mg total) by mouth daily.  30 capsule  5  . levalbuterol (XOPENEX HFA) 45 MCG/ACT inhaler Inhale 2 puffs into the lungs every 8 (eight) hours as needed for wheezing.  1 Inhaler  1  . montelukast (SINGULAIR) 10 MG tablet Take 1 tablet (10 mg total) by mouth at bedtime.  30 tablet  3  . Multiple Vitamin (MULTIVITAMIN) tablet Take 1 tablet by mouth daily.      . pentosan polysulfate (ELMIRON) 100 MG capsule Take 100 mg by mouth 3 (three) times daily before meals.      . topiramate (TOPAMAX) 25 MG tablet Take 1 tablet (25 mg total) by mouth daily.  30 tablet  11  . traMADol (ULTRAM) 50 MG tablet TAKE ONE TABLET BY MOUTH EVERY 6 HOURS AS NEEDED.  60 tablet  0   No current facility-administered medications on file prior to visit.  ALLERGIES: Allergies  Allergen Reactions  . Ciprofloxacin Nausea Only  . Sulfa Antibiotics Other (See Comments)    abd pain  . Imitrex [Sumatriptan] Rash  . Iodinated Diagnostic Agents Rash    Only when given together with chemo    FAMILY HISTORY: Family History  Problem Relation Age of Onset  . Arthritis Mother   . Heart disease Mother     Lorain Childes in the heart  . Hypertension Mother   . Cancer Paternal Aunt     brain  . Cancer Paternal Grandfather     skin    SOCIAL  HISTORY: History   Social History  . Marital Status: Married    Spouse Name: N/A    Number of Children: 1  . Years of Education: N/A   Occupational History  . [employer]     Social History Main Topics  . Smoking status: Never Smoker   . Smokeless tobacco: Never Used  . Alcohol Use: Yes     Comment: Occ  . Drug Use: No  . Sexual Activity: Yes    Partners: Male   Other Topics Concern  . Not on file   Social History Narrative   Exercise-- 4 days a week    REVIEW OF SYSTEMS: Constitutional: No fevers, chills, or sweats, no generalized fatigue, change in appetite Eyes: No visual changes, double vision, eye pain Ear, nose and throat: No hearing loss, ear pain, nasal congestion, sore throat Cardiovascular: No chest pain, palpitations Respiratory:  No shortness of breath at rest or with exertion, wheezes GastrointestinaI: reflux Genitourinary:  No dysuria, urinary retention or frequency Musculoskeletal:  No neck pain, back pain Integumentary: No rash, pruritus, skin lesions Neurological: as above Psychiatric: depression Endocrine: menopause Hematologic/Lymphatic:  No anemia, purpura, petechiae. Allergic/Immunologic: no itchy/runny eyes, nasal congestion, recent allergic reactions, rashes  PHYSICAL EXAM: Filed Vitals:   01/22/14 1332  BP: 110/70  Pulse: 71   General: No acute distress Head:  Normocephalic/atraumatic Neck: supple, no paraspinal tenderness, full range of motion Heart:  Regular rate and rhythm Lungs:  Clear to auscultation bilaterally Back: No paraspinal tenderness Neurological Exam: alert and oriented to person, place, and time. Attention span and concentration intact, recent and remote memory intact, fund of knowledge intact.  Speech fluent and not dysarthric, language intact.  CN II-XII intact. Fundoscopic exam unremarkable without vessel changes, exudates, hemorrhages or papilledema.  Bulk and tone normal, muscle strength 5/5 throughout.   Sensation to light touch, temperature and vibration intact.  Deep tendon reflexes 2+ throughout, toes downgoing.  Finger to nose and heel to shin testing intact.  Gait normal, Romberg negative.  IMPRESSION: Migraine without aura Medication-overuse headache  Somewhat limited in treatment options. She is unable to tolerate NSAIDs due to her reflux, and has a history of allergic reaction to triptans.  Would be hesitant to use a beta blocker in 2 to the fact that she already has low blood pressure.  PLAN: 1. We'll start nortriptyline 10 mg at bedtime. Side effects and potential drug interactions were discussed 2. Reduced use of tramadol to no more than 1-2 days out of the week. 3. We will taper off of the Topamax. 4. Stop caffeine. 5.  Discussed sleep hygiene. 6. Call in 4 weeks with an update. Followup in 3 months.  Metta Clines, DO  CC: Garnet Koyanagi, DO

## 2014-02-03 ENCOUNTER — Telehealth: Payer: Self-pay

## 2014-02-03 DIAGNOSIS — G43909 Migraine, unspecified, not intractable, without status migrainosus: Secondary | ICD-10-CM

## 2014-02-03 MED ORDER — TRAMADOL HCL 50 MG PO TABS: ORAL_TABLET | ORAL | Status: DC

## 2014-02-03 NOTE — Telephone Encounter (Signed)
Rx faxed.    KP 

## 2014-02-03 NOTE — Telephone Encounter (Signed)
Refill x1   2 refills 

## 2014-02-03 NOTE — Telephone Encounter (Signed)
Last seen 01/04/14 and filled 12/24/13 #60. UDS 08/19/13   Please advise     KP

## 2014-02-08 ENCOUNTER — Ambulatory Visit: Payer: BC Managed Care – PPO | Admitting: Family Medicine

## 2014-02-24 ENCOUNTER — Other Ambulatory Visit: Payer: Self-pay | Admitting: Neurology

## 2014-02-25 ENCOUNTER — Telehealth: Payer: Self-pay | Admitting: Neurology

## 2014-02-25 NOTE — Telephone Encounter (Signed)
I left message for patient  To return call to office

## 2014-02-25 NOTE — Telephone Encounter (Signed)
Pt called requesting to speak to a nurse regarding her meds.  C/b [phone]

## 2014-03-18 ENCOUNTER — Telehealth: Payer: Self-pay | Admitting: Family Medicine

## 2014-03-18 NOTE — Telephone Encounter (Signed)
Caller name: Jacqualyn Relation to [id number] Call back number: [phone] Pharmacy:  Reason for call: patient called and stated that she need a rx for Ostomy products. Please advise

## 2014-03-22 NOTE — Telephone Encounter (Signed)
Patient has been made aware to contact the company and have them send over an order. She voiced understanding and has agreed to do so.     KP

## 2014-03-22 NOTE — Telephone Encounter (Signed)
MSG left to call the office      KP 

## 2014-03-26 ENCOUNTER — Other Ambulatory Visit: Payer: Self-pay | Admitting: Family Medicine

## 2014-03-29 NOTE — Telephone Encounter (Signed)
Rx was denied because it had already been filled on (01-04-14 #90,11).//AB/CMA

## 2014-03-30 ENCOUNTER — Telehealth: Payer: Self-pay | Admitting: *Deleted

## 2014-03-30 NOTE — Telephone Encounter (Signed)
Received stamped signed Order verify form per Dr. Etter Sjogren.  All forms faxed to [facility] at [phone]).  Confirmation received.//AB/CMA

## 2014-04-20 ENCOUNTER — Other Ambulatory Visit: Payer: Self-pay | Admitting: Neurology

## 2014-04-26 ENCOUNTER — Telehealth: Payer: Self-pay | Admitting: General Practice

## 2014-04-26 ENCOUNTER — Ambulatory Visit: Payer: BC Managed Care – PPO | Admitting: Neurology

## 2014-04-26 ENCOUNTER — Telehealth: Payer: Self-pay | Admitting: Neurology

## 2014-04-26 NOTE — Telephone Encounter (Signed)
See below note.

## 2014-04-26 NOTE — Telephone Encounter (Signed)
Pt called at 9:07AM on 04/26/14 to r/s her 04/26/14 3 month f/u appt with Dr. Tomi Likens due to her being called into work. She is r/s to August 26th at 1:15PM.

## 2014-04-26 NOTE — Telephone Encounter (Signed)
Fax received for colostomy supplies: irrigation supply sleeve, OST [facility] CLSD W Barrier/Filtr  Papers signed by Etter Sjogren and faxed to [phone] on 8/17 by JT

## 2014-05-05 ENCOUNTER — Ambulatory Visit: Payer: BC Managed Care – PPO | Admitting: Neurology

## 2014-05-06 ENCOUNTER — Telehealth: Payer: Self-pay | Admitting: Neurology

## 2014-05-06 NOTE — Telephone Encounter (Signed)
Pt no showed 05/05/14 appt w/ Dr. Tomi Likens.   Melissa Noon - please send no show letter to pt / Sherri S.

## 2014-05-08 ENCOUNTER — Other Ambulatory Visit: Payer: Self-pay | Admitting: Family Medicine

## 2014-05-10 ENCOUNTER — Encounter: Payer: Self-pay | Admitting: *Deleted

## 2014-05-10 NOTE — Progress Notes (Signed)
No show letter sent for 05/05/2014

## 2014-06-04 ENCOUNTER — Telehealth: Payer: Self-pay

## 2014-06-04 NOTE — Telephone Encounter (Signed)
Zoe Briggs [phone] [place] called to check on a refill for [name] 0.05-0.14 MG/DAY that the pharmacy sent. She is out.

## 2014-06-04 NOTE — Telephone Encounter (Signed)
Rx sent on 05/10/14, I confirmed the Rx has been received with the pharmacy.      KP

## 2014-06-23 ENCOUNTER — Other Ambulatory Visit: Payer: Self-pay | Admitting: Neurology

## 2014-07-05 ENCOUNTER — Telehealth: Payer: Self-pay

## 2014-07-05 DIAGNOSIS — G43909 Migraine, unspecified, not intractable, without status migrainosus: Secondary | ICD-10-CM

## 2014-07-05 MED ORDER — TRAMADOL HCL 50 MG PO TABS: ORAL_TABLET | ORAL | Status: DC

## 2014-07-05 NOTE — Telephone Encounter (Signed)
Patient aware Rx will need to be picked up at the office      KP

## 2014-07-05 NOTE — Telephone Encounter (Signed)
Refill x1 Since cat change for tramadol-- we need to check UDS annually

## 2014-07-05 NOTE — Telephone Encounter (Signed)
Last seen 01/04/14 and filled 02/03/14 #60 with 2 rf No UDS on file   Please advise     KP

## 2014-07-15 ENCOUNTER — Encounter: Payer: Self-pay | Admitting: Family Medicine

## 2014-07-28 ENCOUNTER — Telehealth: Payer: Self-pay | Admitting: *Deleted

## 2014-07-28 NOTE — Telephone Encounter (Signed)
Received supply order via fax from [employer]. Order is for irrigation supply sleeve and compression stocking. Forwarded to Dr. Etter Sjogren for signature. JG//CMA

## 2014-07-30 NOTE — Telephone Encounter (Signed)
Signed orders faxed back to [facility] at [phone]. JG//CMA

## 2014-08-23 ENCOUNTER — Telehealth: Payer: Self-pay | Admitting: *Deleted

## 2014-08-23 NOTE — Telephone Encounter (Signed)
Received medical record request via fax from [facility]. Form forwarded to [facility]. JG//CMA 

## 2014-09-17 ENCOUNTER — Other Ambulatory Visit (HOSPITAL_COMMUNITY)
Admission: RE | Admit: 2014-09-17 | Discharge: 2014-09-17 | Disposition: A | Discharge status: Home / Self Care | Payer: BLUE CROSS/BLUE SHIELD | Source: Ambulatory Visit | Attending: Obstetrics & Gynecology | Admitting: Obstetrics & Gynecology

## 2014-09-17 ENCOUNTER — Other Ambulatory Visit: Payer: Self-pay | Admitting: Obstetrics & Gynecology

## 2014-09-17 DIAGNOSIS — Z1151 Encounter for screening for human papillomavirus (HPV): Secondary | ICD-10-CM | POA: Diagnosis present

## 2014-09-17 DIAGNOSIS — Z01419 Encounter for gynecological examination (general) (routine) without abnormal findings: Secondary | ICD-10-CM | POA: Insufficient documentation

## 2014-09-20 LAB — CYTOLOGY - PAP

## 2014-10-04 ENCOUNTER — Telehealth: Payer: Self-pay

## 2014-10-04 DIAGNOSIS — G43909 Migraine, unspecified, not intractable, without status migrainosus: Secondary | ICD-10-CM

## 2014-10-04 MED ORDER — TRAMADOL HCL 50 MG PO TABS: ORAL_TABLET | ORAL | Status: DC

## 2014-10-04 NOTE — Telephone Encounter (Signed)
Refill x1 

## 2014-10-04 NOTE — Telephone Encounter (Signed)
Rx faxed.    KP 

## 2014-10-04 NOTE — Telephone Encounter (Signed)
Last seen 01/04/14 and filled 07/05/14 #60 UDS 07/05/14 Low risk    Please advise     KP

## 2014-10-18 ENCOUNTER — Other Ambulatory Visit: Payer: Self-pay | Admitting: Family Medicine

## 2014-10-18 NOTE — Telephone Encounter (Signed)
Last seen 01/04/14 and filled 10/04/14 #60 UDS 07/05/14 low risk  Please advise     KP

## 2014-11-03 ENCOUNTER — Other Ambulatory Visit: Payer: Self-pay | Admitting: Family Medicine

## 2015-01-16 ENCOUNTER — Other Ambulatory Visit: Payer: Self-pay | Admitting: Family Medicine

## 2015-03-16 DIAGNOSIS — N3010 Interstitial cystitis (chronic) without hematuria: Secondary | ICD-10-CM | POA: Insufficient documentation

## 2015-08-21 ENCOUNTER — Other Ambulatory Visit: Payer: Self-pay | Admitting: Family Medicine

## 2015-08-22 NOTE — Telephone Encounter (Signed)
The patient has not been seen in over 1 year. She needs an OV.       KP

## 2015-09-20 NOTE — Telephone Encounter (Signed)
No longer under provider care.     KP

## 2015-09-20 NOTE — Telephone Encounter (Signed)
LVM advising patient to call back and schedule appointment.  °

## 2017-02-08 HISTORY — PX: FOOT SURGERY: SHX648

## 2017-02-18 MED FILL — OXYCODONE/APAP 5/325 MG TAB: 5-325 | 5 days supply | Qty: 30 | Fill #0

## 2017-07-17 DIAGNOSIS — Z939 Artificial opening status, unspecified: Secondary | ICD-10-CM | POA: Insufficient documentation

## 2017-08-26 DIAGNOSIS — D72819 Decreased white blood cell count, unspecified: Secondary | ICD-10-CM | POA: Insufficient documentation

## 2017-12-31 DIAGNOSIS — F3341 Major depressive disorder, recurrent, in partial remission: Secondary | ICD-10-CM | POA: Insufficient documentation

## 2018-05-09 ENCOUNTER — Encounter (HOSPITAL_COMMUNITY): Payer: Self-pay | Admitting: *Deleted

## 2018-05-09 ENCOUNTER — Other Ambulatory Visit: Payer: Self-pay

## 2018-05-09 ENCOUNTER — Emergency Department (HOSPITAL_COMMUNITY): Payer: BLUE CROSS/BLUE SHIELD

## 2018-05-09 ENCOUNTER — Emergency Department (HOSPITAL_COMMUNITY)
Admission: EM | Admit: 2018-05-09 | Discharge: 2018-05-09 | Disposition: A | Discharge status: Home / Self Care | Payer: BLUE CROSS/BLUE SHIELD | Attending: Emergency Medicine | Admitting: Emergency Medicine

## 2018-05-09 DIAGNOSIS — Z79899 Other long term (current) drug therapy: Secondary | ICD-10-CM | POA: Insufficient documentation

## 2018-05-09 DIAGNOSIS — J45909 Unspecified asthma, uncomplicated: Secondary | ICD-10-CM | POA: Diagnosis not present

## 2018-05-09 DIAGNOSIS — K859 Acute pancreatitis without necrosis or infection, unspecified: Secondary | ICD-10-CM | POA: Diagnosis not present

## 2018-05-09 DIAGNOSIS — R109 Unspecified abdominal pain: Secondary | ICD-10-CM | POA: Diagnosis present

## 2018-05-09 HISTORY — DX: Acute pancreatitis without necrosis or infection, unspecified: K85.90

## 2018-05-09 LAB — COMPREHENSIVE METABOLIC PANEL
ALT: 16 U/L (ref 0–44)
AST: 19 U/L (ref 15–41)
Albumin: 4.2 g/dL (ref 3.5–5.0)
Alkaline Phosphatase: 75 U/L (ref 38–126)
Anion gap: 12 (ref 5–15)
BUN: 15 mg/dL (ref 6–20)
CO2: 22 mmol/L (ref 22–32)
Calcium: 9.6 mg/dL (ref 8.9–10.3)
Chloride: 106 mmol/L (ref 98–111)
Creatinine, Ser: 0.68 mg/dL (ref 0.44–1.00)
GFR calc Af Amer: >60 mL/min (ref >60)
GFR calc non Af Amer: >60 mL/min (ref >60)
Glucose, Bld: 116 mg/dL — ABNORMAL HIGH (ref 70–99)
Potassium: 3.5 mmol/L (ref 3.5–5.1)
Sodium: 140 mmol/L (ref 135–145)
Total Bilirubin: 0.4 mg/dL (ref 0.3–1.2)
Total Protein: 7 g/dL (ref 6.5–8.1)

## 2018-05-09 LAB — CBC
HCT: 41.9 % (ref 36.0–46.0)
Hemoglobin: 14.1 g/dL (ref 12.0–15.0)
MCH: 31.1 pg (ref 26.0–34.0)
MCHC: 33.7 g/dL (ref 30.0–36.0)
MCV: 92.5 fL (ref 78.0–100.0)
Platelets: 204 10*3/uL (ref 150–400)
RBC: 4.53 MIL/uL (ref 3.87–5.11)
RDW: 12.9 % (ref 11.5–15.5)
WBC: 3.3 10*3/uL — ABNORMAL LOW (ref 4.0–10.5)

## 2018-05-09 LAB — URINALYSIS, ROUTINE W REFLEX MICROSCOPIC
Bilirubin Urine: NEGATIVE
Glucose, UA: NEGATIVE mg/dL
Hgb urine dipstick: NEGATIVE
Ketones, ur: 5 mg/dL — AB
Leukocytes, UA: NEGATIVE
Nitrite: NEGATIVE
Protein, ur: NEGATIVE mg/dL
Specific Gravity, Urine: 1.014 (ref 1.005–1.030)
pH: 5 (ref 5.0–8.0)

## 2018-05-09 LAB — I-STAT BETA HCG BLOOD, ED (MC, WL, AP ONLY): I-stat hCG, quantitative: <5 m[IU]/mL (ref <5)

## 2018-05-09 LAB — LIPASE, BLOOD: Lipase: 305 U/L — ABNORMAL HIGH (ref 11–51)

## 2018-05-09 MED ORDER — HYDROMORPHONE HCL 1 MG/ML IJ SOLN
1.0000 mg | Freq: Once | INTRAMUSCULAR | Status: AC
  Administered 2018-05-09: 1 mg via INTRAVENOUS
  Filled 2018-05-09: qty 1

## 2018-05-09 MED ORDER — SODIUM CHLORIDE 0.9 % IV BOLUS
1000.0000 mL | Freq: Once | INTRAVENOUS | Status: AC
  Administered 2018-05-09: 1000 mL via INTRAVENOUS

## 2018-05-09 MED ORDER — OXYCODONE-ACETAMINOPHEN 5-325 MG PO TABS: 1.0000 | ORAL_TABLET | Freq: Four times a day (QID) | ORAL | 0 refills | Status: DC | PRN

## 2018-05-09 MED ORDER — PROMETHAZINE HCL 25 MG PO TABS: 25.0000 mg | ORAL_TABLET | Freq: Four times a day (QID) | ORAL | 0 refills | Status: DC | PRN

## 2018-05-09 MED ORDER — ONDANSETRON HCL 4 MG/2ML IJ SOLN
4.0000 mg | Freq: Once | INTRAMUSCULAR | Status: AC
  Administered 2018-05-09: 4 mg via INTRAVENOUS
  Filled 2018-05-09: qty 2

## 2018-05-09 NOTE — ED Provider Notes (Signed)
[facility] DEPT Provider Note   CSN: [id number] Arrival date & time: 05/09/18  9629     History   Chief Complaint Chief Complaint  Patient presents with  . Abdominal Pain    HPI Zoe Briggs is a 42 y.o. female.  HPI.  Patient presents to the emergency department with mid abdominal pain that started last night.  Patient states she was recently seen in the last month at a hospital in [place] for pancreatitis.  Patient states that she was followed up here by [facility].  Patient states that she has had nausea but no vomiting.  Patient states she did not take any medications prior to arrival for her symptoms.  The patient denies chest pain, shortness of breath, headache,blurred vision, neck pain, fever, cough, weakness, numbness, dizziness, anorexia, edema, vomiting, diarrhea, rash, back pain, dysuria, hematemesis, bloody stool, near syncope, or syncope. Past Medical History:  Diagnosis Date  . Allergy   . Asthma    Seasonal  . Colon cancer ([place]) 2005  . Depression   . IC (interstitial cystitis)   . Migraines   . Pancreatitis     Patient Active Problem List   Diagnosis Date Noted  . Hair loss 08/18/2013  . 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 ADD (attention deficit disorder) 07/08/2012  . Asthma 05/27/2012  . Depression 05/27/2012  . Colon cancer ([place]) 05/27/2012  . Seasonal allergies 05/27/2012    Past Surgical History:  Procedure Laterality Date  . COLON SURGERY    . FOOT SURGERY    . TONSILLECTOMY AND ADENOIDECTOMY       OB History   None      Home Medications    Prior to Admission medications   Medication Sig Start Date End Date Taking? Authorizing Provider  baclofen (LIORESAL) 10 MG tablet Take 10 mg by mouth every 8 (eight) hours as needed for muscle spasms.  11/29/17 11/29/18 Yes [provider]  cetirizine (ZYRTEC) 10 MG tablet Take 10 mg by mouth daily.   Yes [provider]  CLIMARA PRO 0.045-0.015 MG/DAY  Place 1 patch onto the skin once a week. 05/08/18  Yes [provider]  COMBIPATCH 0.05-0.14 MG/DAY APPLY ONE PATCH TOPICALLY EVERY THREE(3) DAYS 11/03/14  Yes Lowne Chase, Yvonne R, DO  EMGALITY 120 MG/ML SOAJ Inject 1 each into the skin every 30 (thirty) days. 04/28/18  Yes [provider]  fexofenadine (ALLEGRA) 180 MG tablet Take 180 mg by mouth daily.   Yes [provider]  lansoprazole (PREVACID) 30 MG capsule Take 1 capsule (30 mg total) by mouth daily. 06/15/13  Yes Roma Schanz R, DO  levalbuterol (XOPENEX HFA) 45 MCG/ACT inhaler Inhale 2 puffs into the lungs every 8 (eight) hours as needed for wheezing. 08/13/13  Yes Roma Schanz R, DO  Meth-Hyo-M Bl-Na Phos-Ph Sal (URIBEL) 118 MG CAPS Take 118 mg by mouth daily.  02/22/16  Yes [provider]  Multiple Vitamin (MULTIVITAMIN) tablet Take 1 tablet by mouth daily.   Yes [provider]  nortriptyline (PAMELOR) 10 MG capsule TAKE 1 CAPSULE (10 MG TOTAL) BY MOUTH AT BEDTIME. Patient taking differently: Take 10 mg by mouth at bedtime.  06/23/14  Yes Jaffe, Adam R, DO  nortriptyline (PAMELOR) 25 MG capsule Take 25 mg by mouth daily. 03/24/18  Yes [provider]  pentosan polysulfate (ELMIRON) 100 MG capsule Take 100 mg by mouth daily.    Yes [provider]  sertraline (ZOLOFT) 25 MG tablet Take  25 mg by mouth daily. 04/12/18  Yes [provider]  citalopram (CELEXA) 10 MG tablet Take 3 po qd Patient not taking: Reported on 05/09/2018 01/04/14   Carollee Herter, Alferd Apa, DO  montelukast (SINGULAIR) 10 MG tablet Take 1 tablet (10 mg total) by mouth at bedtime. Patient not taking: Reported on 05/09/2018 01/04/14   Ann Held, DO  oxyCODONE-acetaminophen (PERCOCET/ROXICET) 5-325 MG tablet Take 1 tablet by mouth every 6 (six) hours as needed for severe pain. 05/09/18   Aras Albarran, Harrell Gave, PA-C  promethazine (PHENERGAN) 25 MG tablet Take 1 tablet (25 mg total) by mouth  every 6 (six) hours as needed for nausea or vomiting. 05/09/18   Lasonja Lakins, Harrell Gave, PA-C  topiramate (TOPAMAX) 25 MG tablet Take 1 tablet (25 mg total) by mouth daily. Patient not taking: Reported on 05/09/2018 01/04/14   Carollee Herter, Alferd Apa, DO  traMADol (ULTRAM) 50 MG tablet TAKE 1 TABLET BY MOUTH EVERY 6 HOURS AS NEEDED Patient not taking: Reported on 05/09/2018 10/18/14   Ann Held, DO    Family History Family History  Problem Relation Age of Onset  . Arthritis Mother   . Heart disease Mother        Lorain Childes in the heart  . Hypertension Mother   . Cancer Paternal Aunt        brain  . Cancer Paternal Grandfather        skin    Social History Social History   Tobacco Use  . Smoking status: Never Smoker  . Smokeless tobacco: Never Used  Substance Use Topics  . Alcohol use: Not Currently    Comment: Occ  . Drug use: No     Allergies   Ciprofloxacin; Sulfa antibiotics; Imitrex [sumatriptan]; and Iodinated diagnostic agents   Review of Systems Review of Systems All other systems negative except as documented in the HPI. All pertinent positives and negatives as reviewed in the HPI.  Physical Exam Updated Vital Signs BP 118/62   Pulse 86   Resp 13   SpO2 99%   Physical Exam  Constitutional: She is oriented to person, place, and time. She appears well-developed and well-nourished. No distress.  HENT:  Head: Normocephalic and atraumatic.  Mouth/Throat: Oropharynx is clear and moist.  Eyes: Pupils are equal, round, and reactive to light.  Neck: Normal range of motion. Neck supple.  Cardiovascular: Normal rate, regular rhythm and normal heart sounds. Exam reveals no gallop and no friction rub.  No murmur heard. Pulmonary/Chest: Effort normal and breath sounds normal. No respiratory distress. She has no wheezes.  Abdominal: Soft. Bowel sounds are normal. She exhibits no distension. There is tenderness in the epigastric area, periumbilical area and left upper  quadrant. There is guarding. There is no rigidity.  Neurological: She is alert and oriented to person, place, and time. She exhibits normal muscle tone. Coordination normal.  Skin: Skin is warm and dry. Capillary refill takes less than 2 seconds. No rash noted. No erythema.  Psychiatric: She has a normal mood and affect. Her behavior is normal.  Nursing note and vitals reviewed.    ED Treatments / Results  Labs (all labs ordered are listed, but only abnormal results are displayed) Labs Reviewed  LIPASE, BLOOD - Abnormal; Notable for the following components:      Result Value   Lipase 305 (*)    All other components within normal limits  COMPREHENSIVE METABOLIC PANEL - Abnormal; Notable for the following components:   Glucose, Bld 116 (*)  All other components within normal limits  CBC - Abnormal; Notable for the following components:   WBC 3.3 (*)    All other components within normal limits  URINALYSIS, ROUTINE W REFLEX MICROSCOPIC - Abnormal; Notable for the following components:   Ketones, ur 5 (*)    All other components within normal limits  I-STAT BETA HCG BLOOD, ED ([facility], [facility], [facility] ONLY)    EKG EKG Interpretation  Date/Time:  Friday May 09 2018 03:34:02 EDT Ventricular Rate:  88 PR Interval:    QRS Duration: 104 QT Interval:  385 QTC Calculation: 466 R Axis:   81 Text Interpretation:  Sinus rhythm Baseline wander Right atrial enlargement No old tracing to compare Confirmed by Rolland Porter [phone]) on 05/09/2018 3:54:54 AM Also confirmed by Rolland Porter [phone]), editor Hattie Perch ([id number])  on 05/09/2018 8:13:23 AM   Radiology Ct Abdomen Pelvis Wo Contrast  Result Date: 05/09/2018 CLINICAL DATA:  Upper abdominal pain, similar to symptoms of previous pancreatitis. EXAM: CT ABDOMEN AND PELVIS WITHOUT CONTRAST TECHNIQUE: Multidetector CT imaging of the abdomen and pelvis was performed following the standard protocol without IV contrast. COMPARISON:  06/25/2012  FINDINGS: Lower chest: Normal Hepatobiliary: Liver parenchyma is normal.  No calcified gallstones. Pancreas: The patient does have mild swelling of the pancreas with mild peripancreatic edema consistent with early pancreatitis. No evidence of pseudocyst formation or advanced disease at this time. Spleen: Normal Adrenals/Urinary Tract: Adrenal glands are normal. Kidneys are normal. Bladder is normal. 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 No complications seen relative to that. No bowel obstruction. Vascular/Lymphatic: Early aortic atherosclerosis. IVC is normal. No retroperitoneal adenopathy. Reproductive: Normal Other: No free fluid or air at this time. Musculoskeletal: Normal IMPRESSION: Acute pancreatitis. Mild swelling of the gland. Mild surrounding edema. No advanced finding at this moment. Uncomplicated appearance of the left lower quadrant colostomy. Electronically Signed   By: Nelson Chimes M.D.   On: 05/09/2018 10:09    Procedures Procedures (including critical care time)  Medications Ordered in ED Medications  sodium chloride 0.9 % bolus 1,000 mL (0 mLs Intravenous Stopped 05/09/18 0849)  HYDROmorphone (DILAUDID) injection 1 mg (1 mg Intravenous Given 05/09/18 0701)  ondansetron (ZOFRAN) injection 4 mg (4 mg Intravenous Given 05/09/18 0702)  HYDROmorphone (DILAUDID) injection 1 mg (1 mg Intravenous Given 05/09/18 1216)     Initial Impression / Assessment and Plan / ED Course  I have reviewed the triage vital signs and the nursing notes.  Pertinent labs & imaging results that were available during my care of the patient were reviewed by me and considered in my medical decision making (see chart for details).     Patient was given IV pain medication along with IV fluids.  I did discuss the patient's findings condition with her.  There was shared decision-making on the fact that she would like to be discharged home.  I did advise that close follow-up and  strict return precautions would be needed in this scenario.  The patient agrees to these conditions and all questions were answered.  The patient has already spoken with her GI doctor about close follow-up.  Final Clinical Impressions(s) / ED Diagnoses   Final diagnoses:  Acute pancreatitis without infection or necrosis, unspecified pancreatitis type    ED Discharge Orders         Ordered    oxyCODONE-acetaminophen (PERCOCET/ROXICET) 5-325 MG tablet  Every 6 hours PRN     05/09/18 1252    promethazine (PHENERGAN) 25 MG tablet  Every 6 hours PRN     05/09/18 1252           Dalia Heading, PA-C 05/09/18 1529    Pattricia Boss, MD 05/09/18 [phone]

## 2018-05-09 NOTE — ED Triage Notes (Signed)
Pt reports upper abdominal pain that started about 15 minutes. Feels similar to when she had pancreatitis in July. No n/v/d.

## 2018-05-09 NOTE — ED Notes (Signed)
Pt d/c home per MD order. Discharge summary reviewed, pt verbalizes understanding. Informs this nurse that husband will be driving her home. Pt wheeled to front lobby via [place]. Pt husband in loop for pickup. No s/s of distress noted.

## 2018-05-09 NOTE — Discharge Instructions (Signed)
Return here for any worsening in your condition.  Follow-up with your GI specialist as soon as possible.

## 2018-05-09 NOTE — ED Notes (Signed)
Patient transported to CT 

## 2018-08-28 ENCOUNTER — Ambulatory Visit: Payer: Self-pay | Admitting: Allergy

## 2018-08-29 HISTORY — PX: UPPER GI ENDOSCOPY: SHX6162

## 2019-01-12 ENCOUNTER — Encounter (HOSPITAL_COMMUNITY): Payer: Self-pay | Admitting: Emergency Medicine

## 2019-01-12 ENCOUNTER — Emergency Department (HOSPITAL_COMMUNITY): Payer: BLUE CROSS/BLUE SHIELD

## 2019-01-12 ENCOUNTER — Emergency Department (HOSPITAL_COMMUNITY)
Admission: EM | Admit: 2019-01-12 | Discharge: 2019-01-12 | Disposition: A | Discharge status: Home / Self Care | Payer: BLUE CROSS/BLUE SHIELD | Attending: Emergency Medicine | Admitting: Emergency Medicine

## 2019-01-12 ENCOUNTER — Other Ambulatory Visit: Payer: Self-pay

## 2019-01-12 DIAGNOSIS — Z85038 Personal history of other malignant neoplasm of large intestine: Secondary | ICD-10-CM | POA: Diagnosis not present

## 2019-01-12 DIAGNOSIS — M545 Low back pain, unspecified: Secondary | ICD-10-CM

## 2019-01-12 DIAGNOSIS — F329 Major depressive disorder, single episode, unspecified: Secondary | ICD-10-CM | POA: Insufficient documentation

## 2019-01-12 DIAGNOSIS — F909 Attention-deficit hyperactivity disorder, unspecified type: Secondary | ICD-10-CM | POA: Insufficient documentation

## 2019-01-12 DIAGNOSIS — Z79899 Other long term (current) drug therapy: Secondary | ICD-10-CM | POA: Diagnosis not present

## 2019-01-12 DIAGNOSIS — J45909 Unspecified asthma, uncomplicated: Secondary | ICD-10-CM | POA: Diagnosis not present

## 2019-01-12 DIAGNOSIS — R1030 Lower abdominal pain, unspecified: Secondary | ICD-10-CM | POA: Diagnosis present

## 2019-01-12 LAB — COMPREHENSIVE METABOLIC PANEL
ALT: 18 U/L (ref 0–44)
AST: 22 U/L (ref 15–41)
Albumin: 4 g/dL (ref 3.5–5.0)
Alkaline Phosphatase: 74 U/L (ref 38–126)
Anion gap: 8 (ref 5–15)
BUN: 11 mg/dL (ref 6–20)
CO2: 26 mmol/L (ref 22–32)
Calcium: 9.5 mg/dL (ref 8.9–10.3)
Chloride: 106 mmol/L (ref 98–111)
Creatinine, Ser: 0.75 mg/dL (ref 0.44–1.00)
GFR calc Af Amer: >60 mL/min (ref >60)
GFR calc non Af Amer: >60 mL/min (ref >60)
Glucose, Bld: 102 mg/dL — ABNORMAL HIGH (ref 70–99)
Potassium: 3.6 mmol/L (ref 3.5–5.1)
Sodium: 140 mmol/L (ref 135–145)
Total Bilirubin: 0.7 mg/dL (ref 0.3–1.2)
Total Protein: 6.6 g/dL (ref 6.5–8.1)

## 2019-01-12 LAB — CBC WITH DIFFERENTIAL/PLATELET
Abs Immature Granulocytes: 0.01 10*3/uL (ref 0.00–0.07)
Basophils Absolute: 0 10*3/uL (ref 0.0–0.1)
Basophils Relative: 1 %
Eosinophils Absolute: 0.1 10*3/uL (ref 0.0–0.5)
Eosinophils Relative: 2 %
HCT: 41.3 % (ref 36.0–46.0)
Hemoglobin: 13.7 g/dL (ref 12.0–15.0)
Immature Granulocytes: 0 %
Lymphocytes Relative: 37 %
Lymphs Abs: 1.1 10*3/uL (ref 0.7–4.0)
MCH: 31.2 pg (ref 26.0–34.0)
MCHC: 33.2 g/dL (ref 30.0–36.0)
MCV: 94.1 fL (ref 80.0–100.0)
Monocytes Absolute: 0.4 10*3/uL (ref 0.1–1.0)
Monocytes Relative: 14 %
Neutro Abs: 1.4 10*3/uL — ABNORMAL LOW (ref 1.7–7.7)
Neutrophils Relative %: 46 %
Platelets: 189 10*3/uL (ref 150–400)
RBC: 4.39 MIL/uL (ref 3.87–5.11)
RDW: 12.2 % (ref 11.5–15.5)
WBC: 2.9 10*3/uL — ABNORMAL LOW (ref 4.0–10.5)
nRBC: 0 % (ref 0.0–0.2)

## 2019-01-12 LAB — URINALYSIS, ROUTINE W REFLEX MICROSCOPIC
Bilirubin Urine: NEGATIVE
Glucose, UA: NEGATIVE mg/dL
Hgb urine dipstick: NEGATIVE
Ketones, ur: NEGATIVE mg/dL
Leukocytes,Ua: NEGATIVE
Nitrite: NEGATIVE
Protein, ur: NEGATIVE mg/dL
Specific Gravity, Urine: 1.01 (ref 1.005–1.030)
pH: 6 (ref 5.0–8.0)

## 2019-01-12 LAB — I-STAT BETA HCG BLOOD, ED (MC, WL, AP ONLY): I-stat hCG, quantitative: <5 m[IU]/mL (ref <5)

## 2019-01-12 LAB — LIPASE, BLOOD: Lipase: 58 U/L — ABNORMAL HIGH (ref 11–51)

## 2019-01-12 MED ORDER — ONDANSETRON HCL 4 MG/2ML IJ SOLN
4.0000 mg | Freq: Once | INTRAMUSCULAR | Status: AC
  Administered 2019-01-12: 4 mg via INTRAVENOUS
  Filled 2019-01-12: qty 2

## 2019-01-12 MED ORDER — HYDROMORPHONE HCL 1 MG/ML IJ SOLN
1.0000 mg | Freq: Once | INTRAMUSCULAR | Status: AC
  Administered 2019-01-12: 1 mg via INTRAVENOUS
  Filled 2019-01-12: qty 1

## 2019-01-12 MED ORDER — SODIUM CHLORIDE 0.9 % IV BOLUS
1000.0000 mL | Freq: Once | INTRAVENOUS | Status: AC
  Administered 2019-01-12: 1000 mL via INTRAVENOUS

## 2019-01-12 NOTE — Discharge Instructions (Addendum)
You have been seen today for back pain/flank pain. Please read and follow all provided instructions.   1. Medications: tylenol/ibuprofen for pain, robaxin (muscle relaxant - do not drive or operate heavy machinery while taking this medication), usual home medications 2. Treatment: rest, drink plenty of fluids 3. Follow Up: Please follow up with your primary doctor in 2 days for discussion of your diagnoses and further evaluation after today's visit; if you do not have a primary care doctor use the resource guide provided to find one; Please return to the ER for any new or worsening symptoms. Please obtain all of your results from medical records or have your doctors office obtain the results - share them with your doctor - you should be seen at your doctors office. Call today to arrange your follow up.   Take medications as prescribed. Please review all of the medicines and only take them if you do not have an allergy to them. Return to the emergency room for worsening condition or new concerning symptoms. Follow up with your regular doctor. If you don't have a regular doctor use one of the numbers below to establish a primary care doctor.  Please be aware that if you are taking birth control pills, taking other prescriptions, ESPECIALLY ANTIBIOTICS may make the birth control ineffective - if this is the case, either do not engage in sexual activity or use alternative methods of birth control such as condoms until you have finished the medicine and your family doctor says it is OK to restart them. If you are on a blood thinner such as COUMADIN, be aware that any other medicine that you take may cause the coumadin to either work too much, or not enough - you should have your coumadin level rechecked in next 7 days if this is the case.  ?  It is also a possibility that you have an allergic reaction to any of the medicines that you have been prescribed - Everybody reacts differently to medications and while  MOST people have no trouble with most medicines, you may have a reaction such as nausea, vomiting, rash, swelling, shortness of breath. If this is the case, please stop taking the medicine immediately and contact your physician.  ?  You should return to the ER if you develop severe or worsening symptoms.   Emergency Department Resource Guide 1) Find a Doctor and Pay Out of Pocket Although you won't have to find out who is covered by your insurance plan, it is a good idea to ask around and get recommendations. You will then need to call the office and see if the doctor you have chosen will accept you as a new patient and what types of options they offer for patients who are self-pay. Some doctors offer discounts or will set up payment plans for their patients who do not have insurance, but you will need to ask so you aren't surprised when you get to your appointment.  2) Contact Your Local Health Department Not all health departments have doctors that can see patients for sick visits, but many do, so it is worth a call to see if yours does. If you don't know where your local health department is, you can check in your phone book. The CDC also has a tool to help you locate your state's health department, and many state websites also have listings of all of their local health departments.  3) Find a [facility] If your illness is not likely to be  very severe or complicated, you may want to try a walk in clinic. These are popping up all over the country in pharmacies, drugstores, and shopping centers. They're usually staffed by nurse practitioners or physician assistants that have been trained to treat common illnesses and complaints. They're usually fairly quick and inexpensive. However, if you have serious medical issues or chronic medical problems, these are probably not your best option.  No Primary Care Doctor: Call Health Connect at  [phone] - they can help you locate a primary care doctor that   accepts your insurance, provides certain services, etc. Physician Referral Service- [phone]  Chronic Pain Problems: Organization         Address  Phone   Notes  [facility]  [phone] Patients need to be referred by their primary care doctor.   Medication Assistance: Organization         Address  Phone   Notes  [facility] Medication [facility] [address] [place], [place] [address] [phone] --Must be a resident of [facility] -- Must have NO insurance coverage whatsoever (no Medicaid/ Medicare, etc.) -- The pt. MUST have a primary care doctor that directs their care regularly and follows them in the community   [facility]  [phone]   [employer]  [phone]    Agencies that provide inexpensive medical care: Organization         Address  Phone   Notes  [place]  [phone]   [facility]    [phone]   [facility] [place], [place] [address] [phone]   [place] [address], [place] [phone]   [facility]    [phone]   [facility]    [phone]   [place] [address] [place] [address], [place] Phone:  [phone], Fax:  [phone] Hours of Operation:  9 am - 6 pm, M-F.  Also accepts Medicaid/Medicare and self-pay.  [facility] for [place], [address], [place] Phone: [phone], Fax: [phone]. Hours of Operation:  8:30 am - 5:30 pm, M-F.  Also accepts Medicaid and self-pay.  [facility] [address], [place] Phone: [phone]   [place], [place], [place] [phone], Ext. [phone] Mondays & Thursdays: 7-9 AM.  First 15 patients are seen on a first come, first serve basis.    [place] Providers:  Organization          Address  Phone   Notes  [facility] [address], [place] [phone] Also accepts self-pay patients.  [facility] [address], [place]  [phone]   [place], [address], [place] [phone]   [facility] [address] [place] [phone]   Lucianne Lei [address], [place]   [phone] Only accepts [place] patients after they have their name applied to their card.   Self-Pay (no insurance) in [facility]:  Organization         Address  Phone   Notes  Sickle Cell Patients, [facility] [place] [phone]   [facility] [address] [phone]   [facility] [place]  [place], [place], [place] [phone]   [facility]/Dr. Osei-Bonsu  [address] [place] or [address], [place] [phone] Phone number for both [place] and [place] locations is the same.  Urgent Medical and [facility] [address], [place] [phone]   [facility] [address] [place] or [address] [phone]   [facility] [address] [place] [phone], phone; [phone], fax Sees patients 1st and 3rd Saturday of every month.  Must not qualify for public or private insurance (i.e. Medicaid, Medicare, [place] [employer] Choice, Veterans' Benefits)  Household income should be no more than 200% of the poverty level The clinic cannot treat you if you are pregnant or think you are pregnant  Sexually transmitted diseases are not treated at the clinic.

## 2019-01-12 NOTE — ED Notes (Signed)
Resting comfortably without acute needs.  Does c/o pain to IV insertion site without s/sx of infiltration noted.  No fluids running.  Ice chips given.

## 2019-01-12 NOTE — ED Provider Notes (Signed)
Care assumed from [facility], [place].  Please see her full H&P.  In short,  Zoe Briggs is a 43 y.o. female with a PMH of colon cancer in 2005 with ostomy, interstitial cystitis, pancreatitis, migraines, and recurrent UTIs presents for bilateral flank pain onset last night at 7pm. Patient denies any nausea, vomiting, or abdominal pain. Patient denies fever, chills, dysuria, hematuria, chest pain, or shortness of breath.    Patient has received Zofran and Dilaudid. Labs pending. Patient may require a CT abdomen. DDX includes musculoskeletal pain, pyelonephritis, nephrolithiasis, or pancreatitis.   UA does not reveal signs of infection and patient does not have urinary symptoms. Lipase is mildly elevated at 58. Will order CT abdomen without contrast due to contrast allergy. CT abdomen reveals a small nonobstructive left renal calculus without hydronephrosis or renal obstruction. No other abnormality noted on CT abdomen.  Patient with bilateral flank pain. Patient was given pain medicine, zofran, and IVF while in the ER. Pain has significantly improved while in the ER. No neurological deficits and normal neuro exam.  Patient can walk without difficulty. No loss of bowel or bladder control.  No concern for cauda equina.  No fever, night sweats, weight loss, or IVDU. UA does not reveal signs of infection. CT abdomen reveals a small non obstructive left renal calculus without hydronephrosis. Discussed findings with patient. Do not suspect renal calculus is causing symptoms since patient reports pain is worse on right side and worse with movement. Suspect symptoms are likely musculoskeletal in nature. Patient states she has muscle relaxants at home. RICE protocol and tylenol/ibuprofen discussed with patient. Discussed return precautions. Advised patient to follow up with PCP and urology. Patient states she understands and agrees with plan.   Findings and plan of care discussed with supervising physician Dr.  Tyrone Nine.    Arville Lime, [place] 01/12/19 [place], DO 01/12/19 1003

## 2019-01-12 NOTE — ED Provider Notes (Addendum)
[facility] EMERGENCY DEPARTMENT Provider Note   CSN: [id number] Arrival date & time: 01/12/19  0607    History   Chief Complaint Chief Complaint  Patient presents with  . Back Pain    HPI Zoe Briggs is a 43 y.o. female with a history of colon cancer with ostomy, interstitial cystitis, pancreatitis, migraines, and recurrent UTIs who presents to the emergency department with a chief complaint of bilateral flank pain.  She reports the constant, sharp pain began last night at 1900.  Reports that she was sitting at the onset of pain.  No recent falls or injuries.  Pain is nonradiating.  She reports the pain is somewhat improved with leaning forward and worse with laying supine.  She reports a history of similar pain several years ago when she had a kidney infection.  No history of nephrolithiasis.  She has not had any fever, chills, dysuria, hematuria, vaginal pain, discharge, diarrhea, constipation, chest pain, shortness of breath, cough.  She denies increase in her ostomy output or pain around her ostomy site. She reports that she attempted to take a couple of muscle relaxers that she has at home for migraines at approximately 2100 that allowed her to sleep for a couple of hours, but the pain never resolved.     The history is provided by the patient. No language interpreter was used.    Past Medical History:  Diagnosis Date  . Allergy   . Asthma    Seasonal  . Colon cancer ([place]) 2005  . Depression   . IC (interstitial cystitis)   . Migraines   . Pancreatitis     Patient Active Problem List   Diagnosis Date Noted  . Hair loss 08/18/2013  . 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 ADD (attention deficit disorder) 07/08/2012  . Asthma 05/27/2012  . Depression 05/27/2012  . Colon cancer ([place]) 05/27/2012  . Seasonal allergies 05/27/2012    Past Surgical History:  Procedure Laterality Date  . COLON SURGERY    . FOOT SURGERY    . TONSILLECTOMY AND ADENOIDECTOMY         OB History   No obstetric history on file.      Home Medications    Prior to Admission medications   Medication Sig Start Date End Date Taking? Authorizing Provider  cetirizine (ZYRTEC) 10 MG tablet Take 10 mg by mouth daily.    [provider]  citalopram (CELEXA) 10 MG tablet Take 3 po qd Patient not taking: Reported on 05/09/2018 01/04/14   Carollee Herter, Alferd Apa, DO  CLIMARA PRO 0.045-0.015 MG/DAY Place 1 patch onto the skin once a week. 05/08/18   [provider]  COMBIPATCH 0.05-0.14 MG/DAY APPLY ONE PATCH TOPICALLY EVERY THREE(3) DAYS 11/03/14   Carollee Herter, Yvonne R, DO  EMGALITY 120 MG/ML SOAJ Inject 1 each into the skin every 30 (thirty) days. 04/28/18   [provider]  fexofenadine (ALLEGRA) 180 MG tablet Take 180 mg by mouth daily.    [provider]  lansoprazole (PREVACID) 30 MG capsule Take 1 capsule (30 mg total) by mouth daily. 06/15/13   Ann Held, DO  levalbuterol (XOPENEX HFA) 45 MCG/ACT inhaler Inhale 2 puffs into the lungs every 8 (eight) hours as needed for wheezing. 08/13/13   Ann Held, DO  Meth-Hyo-M Bl-Na Phos-Ph Sal (URIBEL) 118 MG CAPS Take 118 mg by mouth daily.  02/22/16   [provider]  montelukast (SINGULAIR) 10 MG tablet Take 1 tablet (  10 mg total) by mouth at bedtime. Patient not taking: Reported on 05/09/2018 01/04/14   Ann Held, DO  Multiple Vitamin (MULTIVITAMIN) tablet Take 1 tablet by mouth daily.    [provider]  nortriptyline (PAMELOR) 10 MG capsule TAKE 1 CAPSULE (10 MG TOTAL) BY MOUTH AT BEDTIME. Patient taking differently: Take 10 mg by mouth at bedtime.  06/23/14   Pieter Partridge, DO  nortriptyline (PAMELOR) 25 MG capsule Take 25 mg by mouth daily. 03/24/18   [provider]  oxyCODONE-acetaminophen (PERCOCET/ROXICET) 5-325 MG tablet Take 1 tablet by mouth every 6 (six) hours as needed for severe pain. 05/09/18   Lawyer, Harrell Gave, PA-C   pentosan polysulfate (ELMIRON) 100 MG capsule Take 100 mg by mouth daily.     [provider]  promethazine (PHENERGAN) 25 MG tablet Take 1 tablet (25 mg total) by mouth every 6 (six) hours as needed for nausea or vomiting. 05/09/18   Lawyer, Harrell Gave, PA-C  sertraline (ZOLOFT) 25 MG tablet Take 25 mg by mouth daily. 04/12/18   [provider]  topiramate (TOPAMAX) 25 MG tablet Take 1 tablet (25 mg total) by mouth daily. Patient not taking: Reported on 05/09/2018 01/04/14   Carollee Herter, Alferd Apa, DO  traMADol (ULTRAM) 50 MG tablet TAKE 1 TABLET BY MOUTH EVERY 6 HOURS AS NEEDED Patient not taking: Reported on 05/09/2018 10/18/14   Ann Held, DO    Family History Family History  Problem Relation Age of Onset  . Arthritis Mother   . Heart disease Mother        Lorain Childes in the heart  . Hypertension Mother   . Cancer Paternal Aunt        brain  . Cancer Paternal Grandfather        skin    Social History Social History   Tobacco Use  . Smoking status: Never Smoker  . Smokeless tobacco: Never Used  Substance Use Topics  . Alcohol use: Not Currently    Comment: Occ  . Drug use: No     Allergies   Ciprofloxacin; Sulfa antibiotics; Imitrex [sumatriptan]; and Iodinated diagnostic agents   Review of Systems Review of Systems  Constitutional: Negative for activity change, chills and fever.  Respiratory: Negative for shortness of breath.   Cardiovascular: Negative for chest pain.  Gastrointestinal: Negative for abdominal pain, constipation, diarrhea, nausea and vomiting.  Genitourinary: Negative for dysuria.  Musculoskeletal: Positive for back pain.  Skin: Negative for rash.  Allergic/Immunologic: Negative for immunocompromised state.  Neurological: Negative for dizziness, syncope, weakness, numbness and headaches.  Psychiatric/Behavioral: Negative for confusion.     Physical Exam Updated Vital Signs BP 120/73   Pulse 69   Temp 98 F (36.7 C)    Resp 18   Ht 5\' 10"  (1.778 m)   Wt 70.3 kg   SpO2 100%   BMI 22.24 kg/m   Physical Exam Vitals signs and nursing note reviewed.  Constitutional:      General: She is not in acute distress.    Comments: Appears uncomfortable  HENT:     Head: Normocephalic.  Eyes:     General: No scleral icterus.    Extraocular Movements: Extraocular movements intact.     Conjunctiva/sclera: Conjunctivae normal.     Pupils: Pupils are equal, round, and reactive to light.  Neck:     Musculoskeletal: Normal range of motion and neck supple. No neck rigidity or muscular tenderness.     Vascular: No carotid bruit.  Cardiovascular:     Rate and Rhythm: Normal rate and regular rhythm.     Pulses: Normal pulses.     Heart sounds: Normal heart sounds. No murmur. No friction rub. No gallop.   Pulmonary:     Effort: Pulmonary effort is normal. No respiratory distress.     Breath sounds: No stridor. No wheezing, rhonchi or rales.  Chest:     Chest wall: No tenderness.  Abdominal:     General: There is no distension.     Palpations: Abdomen is soft. There is no mass.     Tenderness: There is abdominal tenderness. There is right CVA tenderness and left CVA tenderness. There is no guarding or rebound.     Hernia: No hernia is present.     Comments: Tender to palpation in the bilateral CVA.  Abdomen is otherwise nontender.  No rebound or guarding.  No pain, redness, or swelling around the ostomy site.  Abdomen is soft with hypertrophy of the muscles, but nondistended.  Normoactive bowel sounds.  Musculoskeletal:     Right lower leg: No edema.     Left lower leg: No edema.     Comments: No tenderness to the cervical, thoracic, lumbar spinous processes or bilateral paraspinal muscles.  No crepitus or step-offs.  Lymphadenopathy:     Cervical: No cervical adenopathy.  Skin:    General: Skin is warm.     Findings: No rash.  Neurological:     Mental Status: She is alert.  Psychiatric:        Behavior:  Behavior normal.      ED Treatments / Results  Labs (all labs ordered are listed, but only abnormal results are displayed) Labs Reviewed  LIPASE, BLOOD - Abnormal; Notable for the following components:      Result Value   Lipase 58 (*)    All other components within normal limits  CBC WITH DIFFERENTIAL/PLATELET - Abnormal; Notable for the following components:   WBC 2.9 (*)    Neutro Abs 1.4 (*)    All other components within normal limits  COMPREHENSIVE METABOLIC PANEL - Abnormal; Notable for the following components:   Glucose, Bld 102 (*)    All other components within normal limits  URINALYSIS, ROUTINE W REFLEX MICROSCOPIC - Abnormal; Notable for the following components:   Color, Urine STRAW (*)    All other components within normal limits  I-STAT BETA HCG BLOOD, ED ([facility], [facility], [facility] ONLY)    EKG None  Radiology No results found.  Procedures Procedures (including critical care time)  Medications Ordered in ED Medications  HYDROmorphone (DILAUDID) injection 1 mg (1 mg Intravenous Given 01/12/19 0644)  ondansetron (ZOFRAN) injection 4 mg (4 mg Intravenous Given 01/12/19 0642)  sodium chloride 0.9 % bolus 1,000 mL (0 mLs Intravenous Stopped 01/12/19 1033)     Initial Impression / Assessment and Plan / ED Course  I have reviewed the triage vital signs and the nursing notes.  Pertinent labs & imaging results that were available during my care of the patient were reviewed by me and considered in my medical decision making (see chart for details).  Clinical Course as of Jan 16 433  Mon Jan 12, 2019  0908 Small nonobstructive left renal calculus. No hydronephrosis or renal obstruction is noted. Stable appearance of left lower quadrant colostomy. No other abnormality seen in the abdomen or pelvis.    CT Abdomen Pelvis Wo Contrast [AH]    Clinical Course User Index [AH] Arville Lime,  PA-C       43 year old female with a history of colon cancer with ostomy,  interstitial cystitis, pancreatitis, migraines, and recurrent UTIs presenting with atraumatic bilateral flank pain, onset last night.  She has no other associated symptoms.  On initial evaluation, she appears very uncomfortable.  Differential diagnosis includes atypical presentation of pancreatitis, pyelonephritis, bilateral nephrolithiasis or musculoskeletal pain.  Reports a history of similar pain and was diagnosed with pyelonephritis.  She has bilateral CVA tenderness.  No midline tenderness.  Abdominal exam is otherwise unremarkable.  Will give Dilaudid for pain control.  Will order basic labs and the patient will likely require imaging of the abdomen and pelvis given her medical comorbidities.   Patient care transferred to [facility] at the end of my shift. Patient presentation, ED course, and plan of care discussed with review of all pertinent labs and imaging. Please see his/her note for further details regarding further ED course and disposition.  Final Clinical Impressions(s) / ED Diagnoses   Final diagnoses:  Acute bilateral low back pain without sciatica    ED Discharge Orders    None       Joanne Gavel, PA-C 01/12/19 0724    Shahla Betsill A, PA-C 01/17/19 0434    Mesner, Corene Cornea, MD 01/21/19 2993

## 2019-01-12 NOTE — ED Notes (Signed)
Patient transported to CT 

## 2019-01-12 NOTE — ED Notes (Signed)
Pt states she understands instructions and home stable . Declines wc

## 2019-01-12 NOTE — ED Triage Notes (Signed)
Pt presents with bilateral low back pain - states it began at 7pm last night. Denies any urinary symptoms or injury.

## 2019-01-12 NOTE — ED Notes (Signed)
ED Provider at bedside. 

## 2019-01-12 NOTE — ED Notes (Signed)
C/o pain to IV site again, line flushed without pain or edema noted.  Cath appears to have tension pulling, taped more securely with relief of sx.  Pt pleasant and alert without other needs.

## 2019-04-10 ENCOUNTER — Encounter (HOSPITAL_COMMUNITY): Payer: Self-pay

## 2019-04-10 ENCOUNTER — Other Ambulatory Visit: Payer: Self-pay

## 2019-04-10 ENCOUNTER — Emergency Department (HOSPITAL_COMMUNITY)
Admission: EM | Admit: 2019-04-10 | Discharge: 2019-04-10 | Disposition: A | Discharge status: Home / Self Care | Payer: BC Managed Care – PPO | Attending: Emergency Medicine | Admitting: Emergency Medicine

## 2019-04-10 DIAGNOSIS — J45909 Unspecified asthma, uncomplicated: Secondary | ICD-10-CM | POA: Diagnosis not present

## 2019-04-10 DIAGNOSIS — Z85038 Personal history of other malignant neoplasm of large intestine: Secondary | ICD-10-CM | POA: Diagnosis not present

## 2019-04-10 DIAGNOSIS — G43801 Other migraine, not intractable, with status migrainosus: Secondary | ICD-10-CM | POA: Insufficient documentation

## 2019-04-10 DIAGNOSIS — Z79899 Other long term (current) drug therapy: Secondary | ICD-10-CM | POA: Diagnosis not present

## 2019-04-10 DIAGNOSIS — E860 Dehydration: Secondary | ICD-10-CM | POA: Diagnosis not present

## 2019-04-10 DIAGNOSIS — R51 Headache: Secondary | ICD-10-CM | POA: Diagnosis present

## 2019-04-10 LAB — CBC
HCT: 41 % (ref 36.0–46.0)
Hemoglobin: 14 g/dL (ref 12.0–15.0)
MCH: 31.5 pg (ref 26.0–34.0)
MCHC: 34.1 g/dL (ref 30.0–36.0)
MCV: 92.1 fL (ref 80.0–100.0)
Platelets: 211 10*3/uL (ref 150–400)
RBC: 4.45 MIL/uL (ref 3.87–5.11)
RDW: 11.7 % (ref 11.5–15.5)
WBC: 5.5 10*3/uL (ref 4.0–10.5)
nRBC: 0 % (ref 0.0–0.2)

## 2019-04-10 LAB — COMPREHENSIVE METABOLIC PANEL
ALT: 25 U/L (ref 0–44)
AST: 26 U/L (ref 15–41)
Albumin: 4.2 g/dL (ref 3.5–5.0)
Alkaline Phosphatase: 67 U/L (ref 38–126)
Anion gap: 12 (ref 5–15)
BUN: 14 mg/dL (ref 6–20)
CO2: 20 mmol/L — ABNORMAL LOW (ref 22–32)
Calcium: 9 mg/dL (ref 8.9–10.3)
Chloride: 96 mmol/L — ABNORMAL LOW (ref 98–111)
Creatinine, Ser: 1.09 mg/dL — ABNORMAL HIGH (ref 0.44–1.00)
GFR calc Af Amer: >60 mL/min (ref >60)
GFR calc non Af Amer: >60 mL/min (ref >60)
Glucose, Bld: 138 mg/dL — ABNORMAL HIGH (ref 70–99)
Potassium: 4 mmol/L (ref 3.5–5.1)
Sodium: 128 mmol/L — ABNORMAL LOW (ref 135–145)
Total Bilirubin: 0.8 mg/dL (ref 0.3–1.2)
Total Protein: 6.6 g/dL (ref 6.5–8.1)

## 2019-04-10 LAB — I-STAT BETA HCG BLOOD, ED (MC, WL, AP ONLY): I-stat hCG, quantitative: <5 m[IU]/mL (ref <5)

## 2019-04-10 MED ORDER — KETOROLAC TROMETHAMINE 15 MG/ML IJ SOLN
15.0000 mg | Freq: Once | INTRAMUSCULAR | Status: AC
  Administered 2019-04-10: 15 mg via INTRAVENOUS
  Filled 2019-04-10: qty 1

## 2019-04-10 MED ORDER — DEXAMETHASONE SODIUM PHOSPHATE 10 MG/ML IJ SOLN
10.0000 mg | Freq: Once | INTRAMUSCULAR | Status: AC
  Administered 2019-04-10: 10 mg via INTRAVENOUS
  Filled 2019-04-10: qty 1

## 2019-04-10 MED ORDER — SODIUM CHLORIDE 0.9% FLUSH: 3.0000 mL | Freq: Once | INTRAVENOUS | Status: DC

## 2019-04-10 MED ORDER — SODIUM CHLORIDE 0.9 % IV BOLUS: 1000.0000 mL | Freq: Once | INTRAVENOUS | Status: DC

## 2019-04-10 MED ORDER — ONDANSETRON 4 MG PO TBDP
4.0000 mg | ORAL_TABLET | Freq: Once | ORAL | Status: AC | PRN
  Administered 2019-04-10: 4 mg via ORAL
  Filled 2019-04-10: qty 1

## 2019-04-10 MED ORDER — METOCLOPRAMIDE HCL 5 MG/ML IJ SOLN
10.0000 mg | Freq: Once | INTRAMUSCULAR | Status: AC
  Administered 2019-04-10: 10 mg via INTRAVENOUS
  Filled 2019-04-10: qty 2

## 2019-04-10 NOTE — ED Triage Notes (Signed)
Pt reports migraine headache since 5pm today along with emesis x4. Denies any abd pain. Pt has hx of migraines. Pt .a.o

## 2019-04-10 NOTE — ED Provider Notes (Signed)
[facility] EMERGENC[facility] DEPARTMENT Provider Note   CSN: [id number] Arrival date & time: 04/10/19  1913     History   Chief Complaint Chief Complaint  Patient presents with  . Migraine  . Emesis    HPI Zoe Briggs is a 43 y.o. female presenting for evaluation of headache and vomiting.  Patient states at 5:00 this morning she developed a headache.  It is frontal, and consistent with her normal migraines.  She reports associated nausea and vomiting.  She took her home abortive medications including Maxalt, baclofen, and ibuprofen without improvement of symptoms.  She takes nortriptyline daily for migraine prevention.  She is followed for her migraines, has not had a severe migraine in about 8 years.  She denies known trigger.  She reports associated photophobia and phonophobia.  She denies fevers, ear pain, nasal congestion, sore throat, chest pain, shortness of breath, abdominal pain, urinary symptoms, normal bowel movements.  She denies recent change in her home medications.  She denies numbness or slurred speech.  No vision changes.  Additional history obtained from chart review, patient with a history of ICH, migraines, pancreatitis, depression, allergies, colon cancer.     HPI  Past Medical History:  Diagnosis Date  . Allergy   . Asthma    Seasonal  . Colon cancer ([place]) 2005  . Depression   . IC (interstitial cystitis)   . Migraines   . Pancreatitis     Patient Active Problem List   Diagnosis Date Noted  . Hair loss 08/18/2013  . 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 ADD (attention deficit disorder) 07/08/2012  . Asthma 05/27/2012  . Depression 05/27/2012  . Colon cancer ([place]) 05/27/2012  . Seasonal allergies 05/27/2012    Past Surgical History:  Procedure Laterality Date  . COLON SURGERY    . FOOT SURGERY    . TONSILLECTOMY AND ADENOIDECTOMY       OB History   No obstetric history on file.      Home Medications    Prior to Admission  medications   Medication Sig Start Date End Date Taking? Authorizing Provider  cetirizine (ZYRTEC) 10 MG tablet Take 10 mg by mouth daily.    [provider]  citalopram (CELEXA) 10 MG tablet Take 3 po qd Patient not taking: Reported on 05/09/2018 01/04/14   Carollee Herter, Alferd Apa, DO  CLIMARA PRO 0.045-0.015 MG/DAY Place 1 patch onto the skin once a week. 05/08/18   [provider]  COMBIPATCH 0.05-0.14 MG/DAY APPLY ONE PATCH TOPICALLY EVERY THREE(3) DAYS 11/03/14   Carollee Herter, Yvonne R, DO  EMGALITY 120 MG/ML SOAJ Inject 1 each into the skin every 30 (thirty) days. 04/28/18   [provider]  fexofenadine (ALLEGRA) 180 MG tablet Take 180 mg by mouth daily.    [provider]  lansoprazole (PREVACID) 30 MG capsule Take 1 capsule (30 mg total) by mouth daily. 06/15/13   Ann Held, DO  levalbuterol (XOPENEX HFA) 45 MCG/ACT inhaler Inhale 2 puffs into the lungs every 8 (eight) hours as needed for wheezing. 08/13/13   Ann Held, DO  Meth-Hyo-M Bl-Na Phos-Ph Sal (URIBEL) 118 MG CAPS Take 118 mg by mouth daily.  02/22/16   [provider]  montelukast (SINGULAIR) 10 MG tablet Take 1 tablet (10 mg total) by mouth at bedtime. Patient not taking: Reported on 05/09/2018 01/04/14   Ann Held, DO  Multiple Vitamin (MULTIVITAMIN) tablet Take 1 tablet by mouth daily.  [provider]  nortriptyline (PAMELOR) 10 MG capsule TAKE 1 CAPSULE (10 MG TOTAL) BY MOUTH AT BEDTIME. Patient taking differently: Take 10 mg by mouth at bedtime.  06/23/14   Pieter Partridge, DO  nortriptyline (PAMELOR) 25 MG capsule Take 25 mg by mouth daily. 03/24/18   [provider]  oxyCODONE-acetaminophen (PERCOCET/ROXICET) 5-325 MG tablet Take 1 tablet by mouth every 6 (six) hours as needed for severe pain. 05/09/18   Lawyer, Harrell Gave, PA-C  pentosan polysulfate (ELMIRON) 100 MG capsule Take 100 mg by mouth daily.     [provider]    promethazine (PHENERGAN) 25 MG tablet Take 1 tablet (25 mg total) by mouth every 6 (six) hours as needed for nausea or vomiting. 05/09/18   Lawyer, Harrell Gave, PA-C  sertraline (ZOLOFT) 25 MG tablet Take 25 mg by mouth daily. 04/12/18   [provider]  topiramate (TOPAMAX) 25 MG tablet Take 1 tablet (25 mg total) by mouth daily. Patient not taking: Reported on 05/09/2018 01/04/14   Carollee Herter, Alferd Apa, DO  traMADol (ULTRAM) 50 MG tablet TAKE 1 TABLET BY MOUTH EVERY 6 HOURS AS NEEDED Patient not taking: Reported on 05/09/2018 10/18/14   Ann Held, DO    Family History Family History  Problem Relation Age of Onset  . Arthritis Mother   . Heart disease Mother        Lorain Childes in the heart  . Hypertension Mother   . Cancer Paternal Aunt        brain  . Cancer Paternal Grandfather        skin    Social History Social History   Tobacco Use  . Smoking status: Never Smoker  . Smokeless tobacco: Never Used  Substance Use Topics  . Alcohol use: Not Currently    Comment: Occ  . Drug use: No     Allergies   Ciprofloxacin, Sulfa antibiotics, Imitrex [sumatriptan], and Iodinated diagnostic agents   Review of Systems Review of Systems  HENT:       Phonophobia  Eyes: Positive for photophobia.  Gastrointestinal: Positive for nausea and vomiting.  Neurological: Positive for headaches.  All other systems reviewed and are negative.    Physical Exam Updated Vital Signs BP 132/89   Pulse (!) 117   Temp 97.9 F (36.6 C) (Oral)   Resp 16   SpO2 98%   Physical Exam Vitals signs and nursing note reviewed.  Constitutional:      General: She is not in acute distress.    Appearance: She is well-developed.     Comments: Appears uncomfortable due to pain, laying in a darkened room.  Nontoxic in appearance  HENT:     Head: Normocephalic and atraumatic.     Comments: OP clear without tonsillar swelling or exudate.  Uvula midline with equal palate rise.  TMs  nonerythematous nonbulging bilaterally. Eyes:     Extraocular Movements: Extraocular movements intact.     Conjunctiva/sclera: Conjunctivae normal.     Pupils: Pupils are equal, round, and reactive to light.     Comments: EOMI and PERRLA. No nystagmus  Neck:     Musculoskeletal: Normal range of motion and neck supple.     Comments: No neck stiffness or signs of meningismus. Cardiovascular:     Rate and Rhythm: Normal rate and regular rhythm.     Pulses: Normal pulses.  Pulmonary:     Effort: Pulmonary effort is normal. No respiratory distress.     Breath sounds: Normal breath  sounds. No wheezing.  Abdominal:     General: There is no distension.     Palpations: Abdomen is soft. There is no mass.     Tenderness: There is no abdominal tenderness. There is no guarding or rebound.  Musculoskeletal: Normal range of motion.     Comments: Strength intact x4.  Sensation intact x4.  Radial pedal pulses intact bilaterally.  No leg pain or swelling.  Skin:    General: Skin is warm and dry.     Capillary Refill: Capillary refill takes less than 2 seconds.  Neurological:     General: No focal deficit present.     Mental Status: She is alert and oriented to person, place, and time.     GCS: GCS eye subscore is 4. GCS verbal subscore is 5. GCS motor subscore is 6.     Cranial Nerves: Cranial nerves are intact.     Sensory: Sensation is intact.     Motor: Motor function is intact.     Coordination: Coordination is intact.     Comments: No obvious neurologic deficits.  CN intact.  Nose to finger intact.  Fine movement and coordination intact.      ED Treatments / Results  Labs (all labs ordered are listed, but only abnormal results are displayed) Labs Reviewed  COMPREHENSIVE METABOLIC PANEL - Abnormal; Notable for the following components:      Result Value   Sodium 128 (*)    Chloride 96 (*)    CO2 20 (*)    Glucose, Bld 138 (*)    Creatinine, Ser 1.09 (*)    All other components  within normal limits  CBC  I-STAT BETA HCG BLOOD, ED ([facility], [facility], [facility] ONLY)    EKG None  Radiology No results found.  Procedures Procedures (including critical care time)  Medications Ordered in ED Medications  sodium chloride flush (NS) 0.9 % injection 3 mL (3 mLs Intravenous Not Given 04/10/19 1943)  sodium chloride 0.9 % bolus 1,000 mL (has no administration in time range)  ondansetron (ZOFRAN-ODT) disintegrating tablet 4 mg (4 mg Oral Given 04/10/19 1927)  ketorolac (TORADOL) 15 MG/ML injection 15 mg (15 mg Intravenous Given 04/10/19 2033)  metoCLOPramide (REGLAN) injection 10 mg (10 mg Intravenous Given 04/10/19 2032)  dexamethasone (DECADRON) injection 10 mg (10 mg Intravenous Given 04/10/19 2031)     Initial Impression / Assessment and Plan / ED Course  I have reviewed the triage vital signs and the nursing notes.  Pertinent labs & imaging results that were available during my care of the patient were reviewed by me and considered in my medical decision making (see chart for details).        Patient presenting for evaluation of migraine.  Physical exam showed patient who appears uncomfortable, but is nontoxic.  No obvious neurologic deficits.  Patient states this feels typical of her normal migraines.  Will give headache cocktail and reassess.  Labs obtained prior to my evaluation.  Labs consistent with mild dehydration, in that her creatinine is slightly up, bicarb slightly down, Na/Cl low.  Will give fluid bolus.  On reassessment, patient reports headache and nausea are resolved.  Requesting to be discharged prior to completion of fluid bolus.  I discussed my concerns for dehydration, she states she understands and would like to be discharged.  I do not believe she needs any further imaging at this time, as such I am agreeable with discharge.  Discussed continued use of abortive medicines at home as  needed, follow-up with her headache doctor as needed.  Return if  headache/migraine return/worsens.  At this time, patient appears safe for discharge.  Return precautions given.  Patient states she understands and agrees to plan.   Final Clinical Impressions(s) / ED Diagnoses   Final diagnoses:  None    ED Discharge Orders    None       Erick Alley 04/10/19 2109    Jola Schmidt, MD 04/10/19 2140

## 2019-04-10 NOTE — Discharge Instructions (Addendum)
Make sure you are staying well-hydrated with water.  Your urine should be clear to pale yellow. Continue taking your home medications as prescribed. Return to the emergency room if you develop severe worsening headache, vision change, slurred speech, numbness, any new, worsening, concerning symptoms.

## 2019-06-01 DIAGNOSIS — J4520 Mild intermittent asthma, uncomplicated: Secondary | ICD-10-CM | POA: Insufficient documentation

## 2019-08-10 DIAGNOSIS — E538 Deficiency of other specified B group vitamins: Secondary | ICD-10-CM | POA: Insufficient documentation

## 2019-08-10 DIAGNOSIS — E559 Vitamin D deficiency, unspecified: Secondary | ICD-10-CM | POA: Insufficient documentation

## 2019-08-12 ENCOUNTER — Emergency Department (HOSPITAL_COMMUNITY): Payer: BC Managed Care – PPO

## 2019-08-12 ENCOUNTER — Encounter (HOSPITAL_COMMUNITY): Payer: Self-pay | Admitting: Emergency Medicine

## 2019-08-12 ENCOUNTER — Emergency Department (HOSPITAL_COMMUNITY)
Admission: EM | Admit: 2019-08-12 | Discharge: 2019-08-12 | Disposition: A | Discharge status: Home / Self Care | Payer: BC Managed Care – PPO | Attending: Emergency Medicine | Admitting: Emergency Medicine

## 2019-08-12 ENCOUNTER — Other Ambulatory Visit: Payer: Self-pay

## 2019-08-12 DIAGNOSIS — K8500 Idiopathic acute pancreatitis without necrosis or infection: Secondary | ICD-10-CM

## 2019-08-12 DIAGNOSIS — J45909 Unspecified asthma, uncomplicated: Secondary | ICD-10-CM | POA: Diagnosis not present

## 2019-08-12 DIAGNOSIS — Z79899 Other long term (current) drug therapy: Secondary | ICD-10-CM | POA: Insufficient documentation

## 2019-08-12 DIAGNOSIS — R1013 Epigastric pain: Secondary | ICD-10-CM | POA: Diagnosis present

## 2019-08-12 LAB — URINALYSIS, ROUTINE W REFLEX MICROSCOPIC
Bilirubin Urine: NEGATIVE
Glucose, UA: NEGATIVE mg/dL
Hgb urine dipstick: NEGATIVE
Ketones, ur: NEGATIVE mg/dL
Leukocytes,Ua: NEGATIVE
Nitrite: NEGATIVE
Protein, ur: NEGATIVE mg/dL
Specific Gravity, Urine: 1.013 (ref 1.005–1.030)
pH: 9 — ABNORMAL HIGH (ref 5.0–8.0)

## 2019-08-12 LAB — COMPREHENSIVE METABOLIC PANEL
ALT: 14 U/L (ref 0–44)
AST: 19 U/L (ref 15–41)
Albumin: 4.2 g/dL (ref 3.5–5.0)
Alkaline Phosphatase: 73 U/L (ref 38–126)
Anion gap: 10 (ref 5–15)
BUN: 8 mg/dL (ref 6–20)
CO2: 21 mmol/L — ABNORMAL LOW (ref 22–32)
Calcium: 10 mg/dL (ref 8.9–10.3)
Chloride: 107 mmol/L (ref 98–111)
Creatinine, Ser: 0.78 mg/dL (ref 0.44–1.00)
GFR calc Af Amer: >60 mL/min (ref >60)
GFR calc non Af Amer: >60 mL/min (ref >60)
Glucose, Bld: 104 mg/dL — ABNORMAL HIGH (ref 70–99)
Potassium: 3.9 mmol/L (ref 3.5–5.1)
Sodium: 138 mmol/L (ref 135–145)
Total Bilirubin: 0.5 mg/dL (ref 0.3–1.2)
Total Protein: 7 g/dL (ref 6.5–8.1)

## 2019-08-12 LAB — CBC WITH DIFFERENTIAL/PLATELET
Abs Immature Granulocytes: 0.02 10*3/uL (ref 0.00–0.07)
Basophils Absolute: 0 10*3/uL (ref 0.0–0.1)
Basophils Relative: 0 %
Eosinophils Absolute: 0 10*3/uL (ref 0.0–0.5)
Eosinophils Relative: 0 %
HCT: 41.8 % (ref 36.0–46.0)
Hemoglobin: 14 g/dL (ref 12.0–15.0)
Immature Granulocytes: 0 %
Lymphocytes Relative: 11 %
Lymphs Abs: 0.5 10*3/uL — ABNORMAL LOW (ref 0.7–4.0)
MCH: 31.6 pg (ref 26.0–34.0)
MCHC: 33.5 g/dL (ref 30.0–36.0)
MCV: 94.4 fL (ref 80.0–100.0)
Monocytes Absolute: 0.5 10*3/uL (ref 0.1–1.0)
Monocytes Relative: 11 %
Neutro Abs: 3.8 10*3/uL (ref 1.7–7.7)
Neutrophils Relative %: 78 %
Platelets: 177 10*3/uL (ref 150–400)
RBC: 4.43 MIL/uL (ref 3.87–5.11)
RDW: 11.5 % (ref 11.5–15.5)
WBC: 4.9 10*3/uL (ref 4.0–10.5)
nRBC: 0 % (ref 0.0–0.2)

## 2019-08-12 LAB — I-STAT BETA HCG BLOOD, ED (MC, WL, AP ONLY): I-stat hCG, quantitative: <5 m[IU]/mL (ref <5)

## 2019-08-12 LAB — LIPASE, BLOOD: Lipase: 4798 U/L — ABNORMAL HIGH (ref 11–51)

## 2019-08-12 MED ORDER — MORPHINE SULFATE (PF) 4 MG/ML IV SOLN
4.0000 mg | Freq: Once | INTRAVENOUS | Status: AC
  Administered 2019-08-12: 4 mg via INTRAVENOUS
  Filled 2019-08-12: qty 1

## 2019-08-12 MED ORDER — SUCRALFATE 1 GM/10ML PO SUSP
1.0000 g | Freq: Three times a day (TID) | ORAL | Status: DC
  Administered 2019-08-12: 1 g via ORAL
  Filled 2019-08-12 (×4): qty 10

## 2019-08-12 MED ORDER — FAMOTIDINE 20 MG PO TABS
40.0000 mg | ORAL_TABLET | Freq: Once | ORAL | Status: AC
  Administered 2019-08-12: 40 mg via ORAL
  Filled 2019-08-12: qty 2

## 2019-08-12 MED ORDER — IOHEXOL 300 MG/ML  SOLN
80.0000 mL | Freq: Once | INTRAMUSCULAR | Status: AC | PRN
  Administered 2019-08-12: 80 mL via INTRAVENOUS

## 2019-08-12 MED ORDER — PROMETHAZINE HCL 25 MG PO TABS: 25.0000 mg | ORAL_TABLET | Freq: Four times a day (QID) | ORAL | 0 refills | Status: DC | PRN

## 2019-08-12 MED ORDER — OXYCODONE HCL 5 MG PO TABS: 5.0000 mg | ORAL_TABLET | ORAL | 0 refills | Status: AC | PRN

## 2019-08-12 MED ORDER — SODIUM CHLORIDE 0.9% FLUSH: 3.0000 mL | Freq: Once | INTRAVENOUS | Status: DC

## 2019-08-12 NOTE — Discharge Instructions (Signed)
Lipase was elevated and CT confirms uncomplicated pancreatitis  Take [phone] mg acetaminophen every 6 hours for mild/moderate pain. Take oxycodone 5 mg every 4 hours for severe or break through pain.  Phenergan for nausea.  Clear liquids.   Recheck with GI in the next 48 hr to ensure symptoms are improving  Return for worsening symptom, fever

## 2019-08-12 NOTE — ED Notes (Signed)
CBC was not collected pt ask for needle be removed

## 2019-08-12 NOTE — ED Notes (Signed)
To CT

## 2019-08-12 NOTE — ED Triage Notes (Signed)
C/o sharp upper abd pain that woke her up around 6:30am.  Denies nausea, vomiting, and diarrhea.  States it feels like pancreatitis did 1 year ago.  Denies urinary complaints.

## 2019-08-12 NOTE — ED Provider Notes (Signed)
[facility] EMERGENCY DEPARTMENT Provider Note   CSN: [id number] Arrival date & time: 08/12/19  [id number]     History   Chief Complaint Chief Complaint  Patient presents with  . Abdominal Pain    HPI Zoe Briggs is a 43 y.o. female h/o pancreatitis, GERD, colon cancer s/p colectomy and colostomy presents to the ER for evaluation of abdominal pain.  Sudden onset at 6:30 AM waking her up from her sleep.  Described as sharp, 8/10, located in the epigastrium without any radiation.  The pain is constant but intermittently worsens and becomes much worse making her breathing harder.  Describes a lot of "gas" and "pressure" in that area that just will not go away.  Denies any radiation into the chest, back pain, neck pain, arm pain, nausea, vomiting, palpitations.  The pain is worse with movement and walking and laying flat.  No interventions.  States the pain feels similar to the time she had pancreatitis a couple of years ago.  She has history of acid reflux/GERD and takes Pepcid as needed.  Admits since Thanksgiving she has been eating a lot more heavy meals and larger meals.  Denies history of ulcers, EtOH, NSAID use.  No fever, vomiting, changes in her stools, dysuria.  She has left lower quadrant colostomy and output has been at baseline.      HPI  Past Medical History:  Diagnosis Date  . Allergy   . Asthma    Seasonal  . Colon cancer ([place]) 2005  . Depression   . IC (interstitial cystitis)   . Migraines   . Pancreatitis     Patient Active Problem List   Diagnosis Date Noted  . Hair loss 08/18/2013  . 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 ADD (attention deficit disorder) 07/08/2012  . Asthma 05/27/2012  . Depression 05/27/2012  . Colon cancer ([place]) 05/27/2012  . Seasonal allergies 05/27/2012    Past Surgical History:  Procedure Laterality Date  . COLON SURGERY    . FOOT SURGERY    . TONSILLECTOMY AND ADENOIDECTOMY       OB History   No obstetric history on  file.      Home Medications    Prior to Admission medications   Medication Sig Start Date End Date Taking? Authorizing Provider  AJOVY 225 MG/1.5ML SOSY Inject 225 mg into the skin every 30 (thirty) days. 08/07/19  Yes [provider]  baclofen (LIORESAL) 10 MG tablet Take 10 mg by mouth 2 (two) times daily as needed for migraine. 02/23/19  Yes [provider]  cetirizine (ZYRTEC) 10 MG tablet Take 10 mg by mouth daily.   Yes [provider]  CLIMARA PRO 0.045-0.015 MG/DAY Place 1 patch onto the skin once a week. 05/08/18  Yes [provider]  fexofenadine (ALLEGRA) 180 MG tablet Take 180 mg by mouth daily.   Yes [provider]  levalbuterol (XOPENEX HFA) 45 MCG/ACT inhaler Inhale 2 puffs into the lungs every 8 (eight) hours as needed for wheezing. 08/13/13  Yes Roma Schanz R, DO  Meth-Hyo-M Bl-Na Phos-Ph Sal (URIBEL) 118 MG CAPS Take 118 mg by mouth daily as needed (bladder spasms).  02/22/16  Yes [provider]  Multiple Vitamin (MULTIVITAMIN) tablet Take 1 tablet by mouth daily.   Yes [provider]  nortriptyline (PAMELOR) 10 MG capsule TAKE 1 CAPSULE (10 MG TOTAL) BY MOUTH AT BEDTIME. Patient taking differently: Take 10 mg by mouth at bedtime. (Takes with 25mg  capsule for a  total of 35mg ) 06/23/14  Yes Jaffe, Adam R, DO  nortriptyline (PAMELOR) 25 MG capsule Take 25 mg by mouth at bedtime. (Take with 10mg  capsule for a total of 35mg ) 03/24/18  Yes [provider]  pentosan polysulfate (ELMIRON) 100 MG capsule Take 100 mg by mouth daily as needed (bladder spasms).    Yes [provider]  rizatriptan (MAXALT) 10 MG tablet Take 10 mg by mouth See admin instructions. Take one tablet by mouth at onset of migraine. May repeat in 2 hours if needed. 11/29/17  Yes [provider]  sertraline (ZOLOFT) 25 MG tablet Take 25 mg by mouth daily. 04/12/18  Yes [provider]  oxyCODONE (OXY  IR/ROXICODONE) 5 MG immediate release tablet Take 1 tablet (5 mg total) by mouth every 4 (four) hours as needed for up to 3 days for severe pain. 08/12/19 08/15/19  Kinnie Feil, PA-C  promethazine (PHENERGAN) 25 MG tablet Take 1 tablet (25 mg total) by mouth every 6 (six) hours as needed for nausea or vomiting. 08/12/19   Kinnie Feil, PA-C    Family History Family History  Problem Relation Age of Onset  . Arthritis Mother   . Heart disease Mother        Lorain Childes in the heart  . Hypertension Mother   . Cancer Paternal Aunt        brain  . Cancer Paternal Grandfather        skin    Social History Social History   Tobacco Use  . Smoking status: Never Smoker  . Smokeless tobacco: Never Used  Substance Use Topics  . Alcohol use: Not Currently    Comment: Occ  . Drug use: No     Allergies   Galcanezumab-gnlm, Ciprofloxacin, Sulfa antibiotics, Imitrex [sumatriptan], and Iodinated diagnostic agents   Review of Systems Review of Systems  Gastrointestinal: Positive for abdominal pain.  Psychiatric/Behavioral: The patient is nervous/anxious.   All other systems reviewed and are negative.    Physical Exam Updated Vital Signs BP 114/82 (BP Location: Right Arm)   Pulse 74   Temp 97.6 F (36.4 C) (Oral)   Resp 16   SpO2 100%   Physical Exam Vitals signs and nursing note reviewed.  Constitutional:      Appearance: She is well-developed.     Comments: Non toxic in NAD  HENT:     Head: Normocephalic and atraumatic.     Nose: Nose normal.  Eyes:     Conjunctiva/sclera: Conjunctivae normal.  Neck:     Musculoskeletal: Normal range of motion.  Cardiovascular:     Rate and Rhythm: Normal rate and regular rhythm.     Comments: 1+ radial and DP pulses bilaterally. Pulmonary:     Effort: Pulmonary effort is normal.     Breath sounds: Normal breath sounds.     Comments: No chest wall tenderness.  Normal work of breathing.  No pleuritic chest pain noted with  breathing. Abdominal:     General: Bowel sounds are normal.     Palpations: Abdomen is soft.     Tenderness: There is abdominal tenderness in the epigastric area.     Comments: No G/R/R. No suprapubic or CVA tenderness. Negative Murphy's and McBurney's. Active BS to lower quadrants.  Left lower quadrant ostomy in place with minimal output.  No local tenderness around the ostomy.  Abdomen is soft, nondistended.  No pulsatility.  No distention.  Musculoskeletal: Normal range of motion.  Skin:    General:  Skin is warm and dry.     Capillary Refill: Capillary refill takes less than 2 seconds.  Neurological:     Mental Status: She is alert.     Comments: Sensation and strength intact in upper and lower extremities  Psychiatric:        Behavior: Behavior normal.     Comments: Appears slightly anxious      ED Treatments / Results  Labs (all labs ordered are listed, but only abnormal results are displayed) Labs Reviewed  LIPASE, BLOOD - Abnormal; Notable for the following components:      Result Value   Lipase 4,798 (*)    All other components within normal limits  COMPREHENSIVE METABOLIC PANEL - Abnormal; Notable for the following components:   CO2 21 (*)    Glucose, Bld 104 (*)    All other components within normal limits  URINALYSIS, ROUTINE W REFLEX MICROSCOPIC - Abnormal; Notable for the following components:   pH 9.0 (*)    All other components within normal limits  CBC WITH DIFFERENTIAL/PLATELET - Abnormal; Notable for the following components:   Lymphs Abs 0.5 (*)    All other components within normal limits  I-STAT BETA HCG BLOOD, ED ([facility], [facility], [facility] ONLY)    EKG None  Radiology Ct Abdomen Pelvis W Contrast  Result Date: 08/12/2019 CLINICAL DATA:  History of pancreatitis. Epigastric pain. Elevated lipase. EXAM: CT ABDOMEN AND PELVIS WITH CONTRAST TECHNIQUE: Multidetector CT imaging of the abdomen and pelvis was performed using the standard protocol following bolus  administration of intravenous contrast. CONTRAST:  25mL OMNIPAQUE IOHEXOL 300 MG/ML  SOLN COMPARISON:  01/12/2019 FINDINGS: Lower chest: Normal Hepatobiliary: Liver parenchyma is normal.  No calcified gallstones. Pancreas: Retroperitoneal edema surrounding the pancreas consistent with pancreatitis. No evidence of pseudocyst or abscess. No evidence of necrosis. Spleen: Normal Adrenals/Urinary Tract: Adrenal glands are normal. Kidneys are normal. Bladder is normal. 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 No unexpected finding. No other bowel finding. Vascular/Lymphatic: Aortic atherosclerosis, premature for age. No aneurysm. IVC is normal. No retroperitoneal adenopathy. Reproductive: Normal Other: No free fluid or air. Musculoskeletal: Normal IMPRESSION: Acute pancreatitis with edema surrounding the pancreas in the retroperitoneum. No evidence of pseudocyst, abscess or necrosis. Previous left colectomy with left lower quadrant colostomy. No unexpected finding. Electronically Signed   By: Nelson Chimes M.D.   On: 08/12/2019 13:34    Procedures Procedures (including critical care time)  Medications Ordered in ED Medications  sodium chloride flush (NS) 0.9 % injection 3 mL (3 mLs Intravenous Not Given 08/12/19 0925)  sucralfate (CARAFATE) 1 GM/10ML suspension 1 g (1 g Oral Given 08/12/19 1350)  morphine 4 MG/ML injection 4 mg (4 mg Intravenous Given 08/12/19 0927)  famotidine (PEPCID) tablet 40 mg (40 mg Oral Given 08/12/19 0926)  morphine 4 MG/ML injection 4 mg (4 mg Intravenous Given 08/12/19 1344)  iohexol (OMNIPAQUE) 300 MG/ML solution 80 mL (80 mLs Intravenous Contrast Given 08/12/19 1327)     Initial Impression / Assessment and Plan / ED Course  I have reviewed the triage vital signs and the nursing notes.  Pertinent labs & imaging results that were available during my care of the patient were reviewed by me and considered in my medical decision making (see chart for  details).  EMR reviewed to assist with MDM.  Patient has GI established in [place].  Last seen October 2019.  Has had 2 episodes of pancreatitis thought to be related to over-the-counter fat burning supplements.  Colonoscopy in 2012.  Pt reports EGD done within the last year to evaluate for etiology of pancreatitis, was told it was normal.   Highest on ddx includes GERD/gastritis given recent changes in diet, location of pain.  Pancreatitis is also a possibility given her previous history.  Considered PUD less likely as she denies any recent exacerbating or causative factors, perforation is unlikely given her exam.  She is hemodynamically stable.  Distal pulses, sensation and strength intact bilaterally and dissection, AAA is unlikely.  She has no radiation into the chest pain or other cardiac symptoms, previous history of CAD and ACS is unlikely as well.  Labs, UA, EKG ordered.  Pepcid, Carafate, morphine.  Will reassess  [id number]: Lipase 4798, otherwise labs reassuring. Glucose 104. No leukocytosis. Normal LFTs.  Pending CTAP.  Documented h/o allergy with IV contrast, discussed this with patient who clarified this is not true. Has had IV contrast before issues, agrees with CT with contrast.   [id number]: CT shows uncomplicated pancreatitis.  Re-evaluated pt, significant improvement in symptoms.  Overall low risk for severe pancreatitis, complications.  She has established GI and good reliable f/u.  Discussed plan to dc with symptom control, clear liquid diet and GI f/u in 48 hr. She prefers this and would like to avoid admission.  Return precautions given.  Final Clinical Impressions(s) / ED Diagnoses   Final diagnoses:  Idiopathic acute pancreatitis without infection or necrosis    ED Discharge Orders         Ordered    oxyCODONE (OXY IR/ROXICODONE) 5 MG immediate release tablet  Every 4 hours PRN     08/12/19 1417    promethazine (PHENERGAN) 25 MG tablet  Every 6 hours PRN     08/12/19 1417            Kinnie Feil, [place] 08/12/19 1419    Maudie Flakes, MD 08/14/19 1459

## 2019-08-12 NOTE — ED Notes (Signed)
Have called pharmacy to send carafate.

## 2019-08-22 ENCOUNTER — Emergency Department (HOSPITAL_COMMUNITY): Payer: BC Managed Care – PPO

## 2019-08-22 ENCOUNTER — Inpatient Hospital Stay (HOSPITAL_COMMUNITY)
Admission: EM | Admit: 2019-08-22 | Discharge: 2019-08-24 | DRG: 440 | Disposition: A | Discharge status: Home / Self Care | Payer: BC Managed Care – PPO | Attending: Internal Medicine | Admitting: Internal Medicine

## 2019-08-22 ENCOUNTER — Encounter (HOSPITAL_COMMUNITY): Payer: Self-pay | Admitting: Emergency Medicine

## 2019-08-22 ENCOUNTER — Other Ambulatory Visit: Payer: Self-pay

## 2019-08-22 DIAGNOSIS — K8500 Idiopathic acute pancreatitis without necrosis or infection: Secondary | ICD-10-CM

## 2019-08-22 DIAGNOSIS — K8020 Calculus of gallbladder without cholecystitis without obstruction: Secondary | ICD-10-CM

## 2019-08-22 DIAGNOSIS — Z91041 Radiographic dye allergy status: Secondary | ICD-10-CM | POA: Diagnosis not present

## 2019-08-22 DIAGNOSIS — K5900 Constipation, unspecified: Secondary | ICD-10-CM | POA: Diagnosis present

## 2019-08-22 DIAGNOSIS — Z85038 Personal history of other malignant neoplasm of large intestine: Secondary | ICD-10-CM | POA: Diagnosis not present

## 2019-08-22 DIAGNOSIS — Z79899 Other long term (current) drug therapy: Secondary | ICD-10-CM | POA: Diagnosis not present

## 2019-08-22 DIAGNOSIS — Z85048 Personal history of other malignant neoplasm of rectum, rectosigmoid junction, and anus: Secondary | ICD-10-CM | POA: Diagnosis not present

## 2019-08-22 DIAGNOSIS — J45909 Unspecified asthma, uncomplicated: Secondary | ICD-10-CM | POA: Diagnosis present

## 2019-08-22 DIAGNOSIS — D6959 Other secondary thrombocytopenia: Secondary | ICD-10-CM | POA: Diagnosis present

## 2019-08-22 DIAGNOSIS — Z933 Colostomy status: Secondary | ICD-10-CM

## 2019-08-22 DIAGNOSIS — F329 Major depressive disorder, single episode, unspecified: Secondary | ICD-10-CM | POA: Diagnosis present

## 2019-08-22 DIAGNOSIS — Z882 Allergy status to sulfonamides status: Secondary | ICD-10-CM

## 2019-08-22 DIAGNOSIS — Z881 Allergy status to other antibiotic agents status: Secondary | ICD-10-CM | POA: Diagnosis not present

## 2019-08-22 DIAGNOSIS — Z888 Allergy status to other drugs, medicaments and biological substances status: Secondary | ICD-10-CM

## 2019-08-22 DIAGNOSIS — K859 Acute pancreatitis without necrosis or infection, unspecified: Secondary | ICD-10-CM | POA: Diagnosis not present

## 2019-08-22 DIAGNOSIS — Z87440 Personal history of urinary (tract) infections: Secondary | ICD-10-CM | POA: Diagnosis not present

## 2019-08-22 DIAGNOSIS — Z20828 Contact with and (suspected) exposure to other viral communicable diseases: Secondary | ICD-10-CM | POA: Diagnosis present

## 2019-08-22 DIAGNOSIS — G43909 Migraine, unspecified, not intractable, without status migrainosus: Secondary | ICD-10-CM | POA: Diagnosis present

## 2019-08-22 HISTORY — DX: Acute pancreatitis without necrosis or infection, unspecified: K85.90

## 2019-08-22 LAB — COMPREHENSIVE METABOLIC PANEL
ALT: 16 U/L (ref 0–44)
AST: 21 U/L (ref 15–41)
Albumin: 4.5 g/dL (ref 3.5–5.0)
Alkaline Phosphatase: 69 U/L (ref 38–126)
Anion gap: 13 (ref 5–15)
BUN: 12 mg/dL (ref 6–20)
CO2: 23 mmol/L (ref 22–32)
Calcium: 9.9 mg/dL (ref 8.9–10.3)
Chloride: 103 mmol/L (ref 98–111)
Creatinine, Ser: 0.58 mg/dL (ref 0.44–1.00)
GFR calc Af Amer: >60 mL/min (ref >60)
GFR calc non Af Amer: >60 mL/min (ref >60)
Glucose, Bld: 140 mg/dL — ABNORMAL HIGH (ref 70–99)
Potassium: 3.4 mmol/L — ABNORMAL LOW (ref 3.5–5.1)
Sodium: 139 mmol/L (ref 135–145)
Total Bilirubin: 0.6 mg/dL (ref 0.3–1.2)
Total Protein: 7.3 g/dL (ref 6.5–8.1)

## 2019-08-22 LAB — I-STAT BETA HCG BLOOD, ED (MC, WL, AP ONLY): I-stat hCG, quantitative: <5 m[IU]/mL (ref <5)

## 2019-08-22 LAB — LIPID PANEL
Cholesterol: 228 mg/dL — ABNORMAL HIGH (ref 0–200)
HDL: 91 mg/dL (ref >40)
LDL Cholesterol: 124 mg/dL — ABNORMAL HIGH (ref 0–99)
Total CHOL/HDL Ratio: 2.5 RATIO
Triglycerides: 66 mg/dL (ref <150)
VLDL: 13 mg/dL (ref 0–40)

## 2019-08-22 LAB — CBC
HCT: 45.2 % (ref 36.0–46.0)
Hemoglobin: 15 g/dL (ref 12.0–15.0)
MCH: 31.6 pg (ref 26.0–34.0)
MCHC: 33.2 g/dL (ref 30.0–36.0)
MCV: 95.2 fL (ref 80.0–100.0)
Platelets: 197 10*3/uL (ref 150–400)
RBC: 4.75 MIL/uL (ref 3.87–5.11)
RDW: 11.9 % (ref 11.5–15.5)
WBC: 5.5 10*3/uL (ref 4.0–10.5)
nRBC: 0 % (ref 0.0–0.2)

## 2019-08-22 LAB — URINALYSIS, ROUTINE W REFLEX MICROSCOPIC
Bilirubin Urine: NEGATIVE
Glucose, UA: NEGATIVE mg/dL
Hgb urine dipstick: NEGATIVE
Ketones, ur: NEGATIVE mg/dL
Leukocytes,Ua: NEGATIVE
Nitrite: NEGATIVE
Protein, ur: NEGATIVE mg/dL
Specific Gravity, Urine: 1.005 (ref 1.005–1.030)
pH: 6 (ref 5.0–8.0)

## 2019-08-22 LAB — LIPASE, BLOOD: Lipase: 7766 U/L — ABNORMAL HIGH (ref 11–51)

## 2019-08-22 LAB — SARS CORONAVIRUS 2 (TAT 6-24 HRS): SARS Coronavirus 2: NEGATIVE

## 2019-08-22 MED ORDER — ADULT MULTIVITAMIN W/MINERALS CH
1.0000 | ORAL_TABLET | Freq: Every day | ORAL | Status: DC
  Administered 2019-08-23 – 2019-08-24 (×2): 1 via ORAL
  Filled 2019-08-22 (×2): qty 1

## 2019-08-22 MED ORDER — SODIUM CHLORIDE 0.9% FLUSH: 3.0000 mL | Freq: Once | INTRAVENOUS | Status: DC

## 2019-08-22 MED ORDER — ACETAMINOPHEN 325 MG PO TABS: 650.0000 mg | ORAL_TABLET | Freq: Four times a day (QID) | ORAL | Status: DC | PRN

## 2019-08-22 MED ORDER — MORPHINE SULFATE (PF) 4 MG/ML IV SOLN: 4.0000 mg | Freq: Once | INTRAVENOUS | Status: DC

## 2019-08-22 MED ORDER — LORATADINE 10 MG PO TABS
10.0000 mg | ORAL_TABLET | Freq: Every day | ORAL | Status: DC
  Administered 2019-08-22 – 2019-08-24 (×3): 10 mg via ORAL
  Filled 2019-08-22 (×3): qty 1

## 2019-08-22 MED ORDER — SERTRALINE HCL 25 MG PO TABS
25.0000 mg | ORAL_TABLET | Freq: Every day | ORAL | Status: DC
  Administered 2019-08-22 – 2019-08-24 (×3): 25 mg via ORAL
  Filled 2019-08-22 (×3): qty 1

## 2019-08-22 MED ORDER — ALUM & MAG HYDROXIDE-SIMETH 200-200-20 MG/5ML PO SUSP
30.0000 mL | Freq: Once | ORAL | Status: AC
  Administered 2019-08-22: 30 mL via ORAL
  Filled 2019-08-22: qty 30

## 2019-08-22 MED ORDER — NON FORMULARY: 10.0000 mg | Freq: Two times a day (BID) | Status: DC | PRN

## 2019-08-22 MED ORDER — NORTRIPTYLINE HCL 25 MG PO CAPS
35.0000 mg | ORAL_CAPSULE | Freq: Every day | ORAL | Status: DC
  Administered 2019-08-22 – 2019-08-23 (×2): 35 mg via ORAL
  Filled 2019-08-22 (×2): qty 1

## 2019-08-22 MED ORDER — HYDROMORPHONE HCL 1 MG/ML IJ SOLN
1.0000 mg | Freq: Once | INTRAMUSCULAR | Status: AC
  Administered 2019-08-22: 1 mg via INTRAVENOUS
  Filled 2019-08-22: qty 1

## 2019-08-22 MED ORDER — SODIUM CHLORIDE 0.9 % IV BOLUS
1000.0000 mL | Freq: Once | INTRAVENOUS | Status: AC
  Administered 2019-08-22: 1000 mL via INTRAVENOUS

## 2019-08-22 MED ORDER — SUMATRIPTAN SUCCINATE 50 MG PO TABS
50.0000 mg | ORAL_TABLET | ORAL | Status: DC | PRN
  Filled 2019-08-22: qty 1

## 2019-08-22 MED ORDER — ONDANSETRON HCL 4 MG/2ML IJ SOLN
4.0000 mg | Freq: Four times a day (QID) | INTRAMUSCULAR | Status: DC | PRN
  Administered 2019-08-22: 4 mg via INTRAVENOUS
  Filled 2019-08-22: qty 2

## 2019-08-22 MED ORDER — LIDOCAINE VISCOUS HCL 2 % MT SOLN
15.0000 mL | Freq: Once | OROMUCOSAL | Status: AC
  Administered 2019-08-22: 15 mL via ORAL
  Filled 2019-08-22: qty 15

## 2019-08-22 MED ORDER — LEVALBUTEROL TARTRATE 45 MCG/ACT IN AERO: 2.0000 | INHALATION_SPRAY | Freq: Three times a day (TID) | RESPIRATORY_TRACT | Status: DC | PRN

## 2019-08-22 MED ORDER — URIBEL 118 MG PO CAPS: 118.0000 mg | ORAL_CAPSULE | Freq: Every day | ORAL | Status: DC | PRN

## 2019-08-22 MED ORDER — ENOXAPARIN SODIUM 40 MG/0.4ML ~~LOC~~ SOLN
40.0000 mg | SUBCUTANEOUS | Status: DC
  Administered 2019-08-22 – 2019-08-23 (×2): 40 mg via SUBCUTANEOUS
  Filled 2019-08-22 (×2): qty 0.4

## 2019-08-22 MED ORDER — SODIUM CHLORIDE 0.9 % IV SOLN
INTRAVENOUS | Status: DC
  Administered 2019-08-22 – 2019-08-24 (×6): via INTRAVENOUS

## 2019-08-22 MED ORDER — HYDROMORPHONE HCL 1 MG/ML IJ SOLN
0.5000 mg | INTRAMUSCULAR | Status: DC | PRN
  Administered 2019-08-22 – 2019-08-24 (×7): 0.5 mg via INTRAVENOUS
  Filled 2019-08-22 (×7): qty 0.5

## 2019-08-22 MED ORDER — SENNOSIDES-DOCUSATE SODIUM 8.6-50 MG PO TABS: 1.0000 | ORAL_TABLET | Freq: Every evening | ORAL | Status: DC | PRN

## 2019-08-22 MED ORDER — DOCUSATE SODIUM 100 MG PO CAPS
100.0000 mg | ORAL_CAPSULE | Freq: Two times a day (BID) | ORAL | Status: DC | PRN
  Administered 2019-08-22 – 2019-08-23 (×2): 100 mg via ORAL
  Filled 2019-08-22 (×2): qty 1

## 2019-08-22 MED ORDER — ZOLMITRIPTAN 2.5 MG PO TABS
2.5000 mg | ORAL_TABLET | Freq: Two times a day (BID) | ORAL | Status: DC | PRN
  Administered 2019-08-22 – 2019-08-23 (×2): 2.5 mg via ORAL
  Filled 2019-08-22 (×4): qty 1

## 2019-08-22 MED ORDER — OXYCODONE HCL 5 MG PO TABS
5.0000 mg | ORAL_TABLET | ORAL | Status: DC | PRN
  Administered 2019-08-22 – 2019-08-24 (×3): 5 mg via ORAL
  Filled 2019-08-22 (×3): qty 1

## 2019-08-22 MED ORDER — AZELASTINE HCL 0.1 % NA SOLN: 2.0000 | Freq: Two times a day (BID) | NASAL | Status: DC | PRN

## 2019-08-22 MED ORDER — ONDANSETRON HCL 4 MG/2ML IJ SOLN
4.0000 mg | Freq: Once | INTRAMUSCULAR | Status: AC
  Administered 2019-08-22: 4 mg via INTRAVENOUS
  Filled 2019-08-22: qty 2

## 2019-08-22 NOTE — ED Notes (Signed)
Pt ambulated to restroom w/o assistance. 

## 2019-08-22 NOTE — ED Triage Notes (Signed)
Patient is complaining of upper abdominal pain that started around 5:30 am today. Patient states it feels like the last time. Patient has a hx of pancreatitis. Patient does not have any other symptoms.

## 2019-08-22 NOTE — H&P (Signed)
History and Physical    Zoe Briggs [id number] DOB: [birth year] DOA: 08/22/2019  PCP: Zoe Hams, FNP  Patient coming from: home  I have personally briefly reviewed patient's old medical records in [place]  Chief Complaint: Abdominal pain  HPI: Zoe Briggs is a 43 y.o. female with medical history significant of rectal ca, asthma, depression, pancreatitis who presents with epigastric abdominal pain.  She reports she has been having severe sharp epigastric pain which is c/w prior episodes of pancreatitis.  She states she had her first ever episode two years prior, then 10 days ago presented to [facility] ER with abdominal pain, noted to have elevated lipase and pancreatitis on imagine c/w clinical presentation.  She went home, continued liquid diet with pain control, and had improvement in her symptoms.  She states she had been advancing her diet and had a chocolate bar yesterday and then woke up today with severe pain.  She denies any fevers, chills, diarrhea.  She has been followed by GI for evaluation.  No hx of Alcohol, ANA, IgG4 wnl, never had elevated triglycerides.  Prior EGD and EUS were normal.   She denies any family hx of pancreatitis, no hx of divisum to her knowledge.  She reports very rare alcohol use and none prior to her flares, denies tobacco or other drugs.  Per review of chart notes, there was concern that maybe an OTC fat burning supplement was etiology.  She did not mention this to me and states she has had no medication changes in the past two years.   Review of Systems: As per HPI otherwise 10 point review of systems negative.    Past Medical History:  Diagnosis Date  . Allergy   . Asthma    Seasonal  . Colon cancer ([place]) 2005  . Depression   . IC (interstitial cystitis)   . Migraines   . Pancreatitis     Past Surgical History:  Procedure Laterality Date  . COLON SURGERY    . FOOT SURGERY    . TONSILLECTOMY AND ADENOIDECTOMY       reports that she has never smoked. She has never used smokeless tobacco. She reports previous alcohol use. She reports that she does not use drugs.  Allergies  Allergen Reactions  . Ciprofloxacin Nausea Only  . Sulfa Antibiotics Other (See Comments)    abd pain  . Imitrex [Sumatriptan] Rash  . Iodinated Diagnostic Agents Rash    Only when given together with chemo    Family History  Problem Relation Age of Onset  . Arthritis Mother   . Heart disease Mother        Lorain Childes in the heart  . Hypertension Mother   . Cancer Paternal Aunt        brain  . Cancer Paternal Grandfather        skin     Prior to Admission medications   Medication Sig Start Date End Date Taking? Authorizing Provider  acetaminophen (TYLENOL) 325 MG tablet Take 650 mg by mouth every 6 (six) hours as needed for mild pain or headache.   Yes [provider]  AJOVY 225 MG/1.5ML SOSY Inject 225 mg into the skin every 30 (thirty) days. 08/07/19  Yes [provider]  azelastine (ASTELIN) 0.1 % nasal spray Place 2 sprays into both nostrils 2 (two) times daily as needed for rhinitis. Use in each nostril as directed   Yes [provider]  cetirizine (ZYRTEC) 10 MG tablet Take 10 mg  by mouth daily.   Yes [provider]  CLIMARA PRO 0.045-0.015 MG/DAY Place 1 patch onto the skin once a week. 05/08/18  Yes [provider]  Folic 123456 3 Acid (MI-OMEGA PO) Take 1 capsule by mouth 2 (two) times daily.   Yes [provider]  levalbuterol (XOPENEX HFA) 45 MCG/ACT inhaler Inhale 2 puffs into the lungs every 8 (eight) hours as needed for wheezing. 08/13/13  Yes Zoe Schanz R, DO  Meth-Hyo-M Bl-Na Phos-Ph Sal (URIBEL) 118 MG CAPS Take 118 mg by mouth daily as needed (bladder spasms).  02/22/16  Yes [provider]  Multiple Vitamin (MULTIVITAMIN) tablet Take 1 tablet by mouth daily.   Yes [provider]  nortriptyline (PAMELOR) 10 MG capsule TAKE  1 CAPSULE (10 MG TOTAL) BY MOUTH AT BEDTIME. Patient taking differently: Take 10 mg by mouth at bedtime. (Takes with 25mg  capsule for a total of 35mg ) 06/23/14  Yes Jaffe, Adam R, DO  nortriptyline (PAMELOR) 25 MG capsule Take 25 mg by mouth at bedtime. (Take with 10mg  capsule for a total of 35mg ) 03/24/18  Yes [provider]  rizatriptan (MAXALT) 10 MG tablet Take 10 mg by mouth See admin instructions. Take one tablet by mouth at onset of migraine. May repeat in 2 hours if needed. 11/29/17  Yes [provider]  sertraline (ZOLOFT) 25 MG tablet Take 25 mg by mouth daily. 04/12/18  Yes [provider]  promethazine (PHENERGAN) 25 MG tablet Take 1 tablet (25 mg total) by mouth every 6 (six) hours as needed for nausea or vomiting. Patient not taking: Reported on 08/22/2019 08/12/19   Zoe Briggs, [place]    Physical Exam: Vitals:   08/22/19 1030 08/22/19 1100 08/22/19 1130 08/22/19 1200  BP: 116/78 116/74 124/85 123/76  Pulse: 66 61 80 75  Resp: 16 17 18 15   Temp:      TempSrc:      SpO2: 97% 96% 98% 97%  Weight:      Height:         Vitals:   08/22/19 1030 08/22/19 1100 08/22/19 1130 08/22/19 1200  BP: 116/78 116/74 124/85 123/76  Pulse: 66 61 80 75  Resp: 16 17 18 15   Temp:      TempSrc:      SpO2: 97% 96% 98% 97%  Weight:      Height:       Constitutional: NAD, calm, pleasant, thin Eyes: PERRL, lids and conjunctivae normal ENMT: Mucous membranes are moist. Posterior pharynx clear of any exudate or lesions.Normal dentition.  Neck: normal, supple, no masses, no thyromegaly Respiratory: clear to auscultation bilaterally, no wheezing, no crackles. Normal respiratory effort. No accessory muscle use.  Cardiovascular: Regular rate and rhythm, no murmurs / rubs / gallops. No extremity edema. 2+ pedal pulses.  Abdomen: + epigastric tenderness, no masses palpated. No hepatosplenomegaly. Bowel sounds positive.  Musculoskeletal: no clubbing / cyanosis. No joint  deformity upper and lower extremities. Good ROM, no contractures. Normal muscle tone.  Skin: no rashes, lesions, ulcers. No induration Neurologic: CN 2-12 grossly intact. Sensation and sensation intact Psychiatric: Normal judgment and insight. Alert and oriented x 3. Normal mood.   Labs on Admission: I have personally reviewed following labs and imaging studies  CBC: Recent Labs  Lab 08/22/19 0710  WBC 5.5  HGB 15.0  HCT 45.2  MCV 95.2  PLT [id number]   Basic Metabolic Panel: Recent Labs  Lab 08/22/19 0710  NA 139  K 3.4*  CL  103  CO2 23  GLUCOSE 140*  BUN 12  CREATININE 0.58  CALCIUM 9.9   GFR: Estimated Creatinine Clearance: 93.5 mL/min (by C-G formula based on SCr of 0.58 mg/dL). Liver Function Tests: Recent Labs  Lab 08/22/19 0710  AST 21  ALT 16  ALKPHOS 69  BILITOT 0.6  PROT 7.3  ALBUMIN 4.5   Recent Labs  Lab 08/22/19 0710  LIPASE 7,766*   No results for input(s): AMMONIA in the last 168 hours. Coagulation Profile: No results for input(s): INR, PROTIME in the last 168 hours. Cardiac Enzymes: No results for input(s): CKTOTAL, CKMB, CKMBINDEX, TROPONINI in the last 168 hours. BNP (last 3 results) No results for input(s): PROBNP in the last 8760 hours. HbA1C: No results for input(s): HGBA1C in the last 72 hours. CBG: No results for input(s): GLUCAP in the last 168 hours. Lipid Profile: Recent Labs    08/22/19 0710  CHOL 228*  HDL 91  LDLCALC 124*  TRIG 66  CHOLHDL 2.5   Thyroid Function Tests: No results for input(s): TSH, T4TOTAL, FREET4, T3FREE, THYROIDAB in the last 72 hours. Anemia Panel: No results for input(s): VITAMINB12, FOLATE, FERRITIN, TIBC, IRON, RETICCTPCT in the last 72 hours. Urine analysis:    Component Value Date/Time   COLORURINE STRAW (A) 08/22/2019 0656   APPEARANCEUR CLEAR 08/22/2019 0656   LABSPEC 1.005 08/22/2019 0656   PHURINE 6.0 08/22/2019 0656   GLUCOSEU NEGATIVE 08/22/2019 0656   GLUCOSEU NEGATIVE 06/03/2012  0948   HGBUR NEGATIVE 08/22/2019 0656   BILIRUBINUR NEGATIVE 08/22/2019 0656   BILIRUBINUR Neg 08/18/2013 1639   KETONESUR NEGATIVE 08/22/2019 0656   PROTEINUR NEGATIVE 08/22/2019 0656   UROBILINOGEN 0.2 08/18/2013 1639   UROBILINOGEN 0.2 06/25/2012 0500   NITRITE NEGATIVE 08/22/2019 0656   LEUKOCYTESUR NEGATIVE 08/22/2019 0656    Radiological Exams on Admission: US Abdomen Limited  Result Date: 08/22/2019 CLINICAL DATA:  Abdominal pain for the last 6 hours. History of pancreatitis. EXAM: ULTRASOUND ABDOMEN LIMITED RIGHT UPPER QUADRANT COMPARISON:  None. FINDINGS: Gallbladder: Moderately distended. No gallstones or wall thickening. No pericholecystic fluid. Common bile duct: Diameter: 3 mm Liver: No focal lesion identified. Within normal limits in parenchymal echogenicity. Portal vein is patent on color Doppler imaging with normal direction of blood flow towards the liver. Other: None. IMPRESSION: Normal right upper quadrant ultrasound. Electronically Signed   By: Lajean Manes M.D.   On: 08/22/2019 12:08     Assessment/Plan Madissyn Roberds is a 43 y.o. female with medical history significant of rectal ca, asthma, depression, pancreatitis who presents with acute pancreatitis.  # Acute Pancreatitis - Lipase 7,766 - recent CT without complications of pancreatitis (no pseudocyst or abscess), and RUQ U/S without any retained stones, normal - Patient with extensive prior work-up, hx and labs not suggestive of alcoholic, gallstone, hereditary, autoimmune, hypertriglyceridemia induced pancreatitis.  No suggestion of divisum on imaging. - continue with supportive management, IVF, Pain control, NPO/CLD for now   # Depression - continue nortriptyline and sertraline  # Asthma - prn inhaler  # Hx of Rectal Ca - no acute issues   DVT prophylaxis: Lovenox Code Status: Full Admission status: inpatient   Truddie Hidden MD [facility] Pager [phone]  If 7PM-7AM, please  contact night-coverage [email or web address] Password [facility]  08/22/2019, 12:43 PM

## 2019-08-22 NOTE — ED Notes (Signed)
Patient has ambulated to restroom without complication or assistance from staff or device.

## 2019-08-22 NOTE — ED Notes (Signed)
Ultrasound at patient bedside. 

## 2019-08-22 NOTE — ED Notes (Signed)
Pt ambulated to restroom w/ IV pump w/o incident.

## 2019-08-22 NOTE — ED Notes (Signed)
Pt stated she was unable to void at this time, will monitor.

## 2019-08-22 NOTE — ED Provider Notes (Signed)
[place] DEPT Provider Note   CSN: [id number] Arrival date & time: 08/22/19  [id number]     History Chief Complaint  Patient presents with  . Abdominal Pain    Zoe Briggs is a 43 y.o. female.  The history is provided by the patient and medical records. No language interpreter was used.  Abdominal Pain      43 year old female with history of idiopathic pancreatitis, interstitial cystitis, colon cancer, GERD, recurrent urinary tract infection, presenting for evaluation abdominal pain.  Patient report acute onset of upper abdominal discomfort that started approximately an hour and a half ago.  She described pain as a sharp stabbing pain nonradiating, 10 out of 10, with associated nausea and dry heaving.  Pain felt very similar to pancreatitis that she was diagnosed a week and a half ago which lasted for about 3 days.  She denies any precipitating symptoms to this pain.  She denies any recent alcohol use.  No complaints of fever chills no chest pain shortness of breath productive cough loss of taste or smell or dysuria.  No specific treatment tried.  Nothing seems to make it better or worse.  Past Medical History:  Diagnosis Date  . Allergy   . Asthma    Seasonal  . Colon cancer ([place]) 2005  . Depression   . IC (interstitial cystitis)   . Migraines   . Pancreatitis     Patient Active Problem List   Diagnosis Date Noted  . Hair loss 08/18/2013  . 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 ADD (attention deficit disorder) 07/08/2012  . Asthma 05/27/2012  . Depression 05/27/2012  . Colon cancer ([place]) 05/27/2012  . Seasonal allergies 05/27/2012    Past Surgical History:  Procedure Laterality Date  . COLON SURGERY    . FOOT SURGERY    . TONSILLECTOMY AND ADENOIDECTOMY       OB History   No obstetric history on file.     Family History  Problem Relation Age of Onset  . Arthritis Mother   . Heart disease Mother        Lorain Childes in the heart  .  Hypertension Mother   . Cancer Paternal Aunt        brain  . Cancer Paternal Grandfather        skin    Social History   Tobacco Use  . Smoking status: Never Smoker  . Smokeless tobacco: Never Used  Substance Use Topics  . Alcohol use: Not Currently    Comment: Occ  . Drug use: No    Home Medications Prior to Admission medications   Medication Sig Start Date End Date Taking? Authorizing Provider  AJOVY 225 MG/1.5ML SOSY Inject 225 mg into the skin every 30 (thirty) days. 08/07/19   [provider]  baclofen (LIORESAL) 10 MG tablet Take 10 mg by mouth 2 (two) times daily as needed for migraine. 02/23/19   [provider]  cetirizine (ZYRTEC) 10 MG tablet Take 10 mg by mouth daily.    [provider]  CLIMARA PRO 0.045-0.015 MG/DAY Place 1 patch onto the skin once a week. 05/08/18   [provider]  fexofenadine (ALLEGRA) 180 MG tablet Take 180 mg by mouth daily.    [provider]  levalbuterol Penne Lash HFA) 45 MCG/ACT inhaler Inhale 2 puffs into the lungs every 8 (eight) hours as needed for wheezing. 08/13/13   Roma Schanz R, DO  Meth-Hyo-M Bl-Na Phos-Ph Sal (URIBEL) 118 MG CAPS Take 118  mg by mouth daily as needed (bladder spasms).  02/22/16   [provider]  Multiple Vitamin (MULTIVITAMIN) tablet Take 1 tablet by mouth daily.    [provider]  nortriptyline (PAMELOR) 10 MG capsule TAKE 1 CAPSULE (10 MG TOTAL) BY MOUTH AT BEDTIME. Patient taking differently: Take 10 mg by mouth at bedtime. (Takes with 25mg  capsule for a total of 35mg ) 06/23/14   Tomi Likens, Adam R, DO  nortriptyline (PAMELOR) 25 MG capsule Take 25 mg by mouth at bedtime. (Take with 10mg  capsule for a total of 35mg ) 03/24/18   [provider]  pentosan polysulfate (ELMIRON) 100 MG capsule Take 100 mg by mouth daily as needed (bladder spasms).     [provider]  promethazine (PHENERGAN) 25 MG tablet Take 1 tablet (25 mg total) by mouth  every 6 (six) hours as needed for nausea or vomiting. 08/12/19   Kinnie Feil, PA-C  rizatriptan (MAXALT) 10 MG tablet Take 10 mg by mouth See admin instructions. Take one tablet by mouth at onset of migraine. May repeat in 2 hours if needed. 11/29/17   [provider]  sertraline (ZOLOFT) 25 MG tablet Take 25 mg by mouth daily. 04/12/18   [provider]    Allergies    Galcanezumab-gnlm, Ciprofloxacin, Sulfa antibiotics, Imitrex [sumatriptan], and Iodinated diagnostic agents  Review of Systems   Review of Systems  Gastrointestinal: Positive for abdominal pain.  All other systems reviewed and are negative.   Physical Exam Updated Vital Signs BP (!) 138/95 (BP Location: Left Arm)   Pulse 88   Temp 97.6 F (36.4 C) (Oral)   Resp 18   Ht 5\' 10"  (1.778 m)   Wt 66.7 kg   SpO2 100%   BMI 21.09 kg/m   Physical Exam Vitals and nursing note reviewed.  Constitutional:      Appearance: She is well-developed.     Comments: Patient sitting upright, appears uncomfortable, moaning holding her upper abdomen.  HENT:     Head: Atraumatic.  Eyes:     Conjunctiva/sclera: Conjunctivae normal.  Cardiovascular:     Rate and Rhythm: Normal rate and regular rhythm.  Pulmonary:     Effort: Pulmonary effort is normal.     Breath sounds: Normal breath sounds.  Abdominal:     General: Abdomen is flat.     Palpations: Abdomen is rigid.     Tenderness: There is abdominal tenderness in the epigastric area. There is guarding.  Musculoskeletal:     Cervical back: Neck supple.  Skin:    Findings: No rash.  Neurological:     General: No focal deficit present.     Mental Status: She is alert.  Psychiatric:        Mood and Affect: Mood normal.     ED Results / Procedures / Treatments   Labs (all labs ordered are listed, but only abnormal results are displayed) Labs Reviewed  LIPASE, BLOOD - Abnormal; Notable for the following components:      Result Value   Lipase 7,766  (*)    All other components within normal limits  COMPREHENSIVE METABOLIC PANEL - Abnormal; Notable for the following components:   Potassium 3.4 (*)    Glucose, Bld 140 (*)    All other components within normal limits  URINALYSIS, ROUTINE W REFLEX MICROSCOPIC - Abnormal; Notable for the following components:   Color, Urine STRAW (*)    All other components within normal limits  SARS CORONAVIRUS 2 (TAT 6-24 HRS)  CBC  LIPID PANEL  I-STAT BETA HCG BLOOD, ED ([facility], [facility], [facility] ONLY)    EKG None  Radiology No results found.  Procedures Procedures (including critical care time)  Medications Ordered in ED Medications  HYDROmorphone (DILAUDID) injection 1 mg (1 mg Intravenous Given 08/22/19 0744)  ondansetron (ZOFRAN) injection 4 mg (4 mg Intravenous Given 08/22/19 0743)  sodium chloride 0.9 % bolus 1,000 mL (0 mLs Intravenous Stopped 08/22/19 0908)  sodium chloride 0.9 % bolus 1,000 mL (1,000 mLs Intravenous New Bag/Given 08/22/19 1105)    ED Course  I have reviewed the triage vital signs and the nursing notes.  Pertinent labs & imaging results that were available during my care of the patient were reviewed by me and considered in my medical decision making (see chart for details).    MDM Rules/Calculators/A&P     CHA2DS2/VAS Stroke Risk Points      N/A >= 2 Points: High Risk  1 - 1.99 Points: Medium Risk  0 Points: Low Risk    A final score could not be computed because of missing components.: Last  Change: N/A     This score determines the patient's risk of having a stroke if the  patient has atrial fibrillation.      This score is not applicable to this patient. Components are not  calculated.                   BP 116/78 (BP Location: Left Arm)   Pulse 66   Temp 97.6 F (36.4 C) (Oral)   Resp 16   Ht 5\' 10"  (1.778 m)   Wt 66.7 kg   SpO2 97%   BMI 21.09 kg/m   Final Clinical Impression(s) / ED Diagnoses Final diagnoses:  Idiopathic acute pancreatitis,  unspecified complication status    Rx / DC Orders ED Discharge Orders    None     7:21 AM Patient with history of idiopathic pancreatitis presenting with upper abdominal discomfort that felt similar to prior pancreatitis flare.  She was seen 10 days ago for the same symptoms.  At that time her lipase was greater than 4000 but patient elected to be discharged home with pain management as well as clear liquid diet.  She appears uncomfortable on exam, actively dry heaving.  States that this pain is worse than the last episode.  Will initiate evaluation and will provide symptomatic treatment.  10:57 AM Lipase is markedly elevated at 7766 today.  Labs otherwise reassuring.  Patient currently receiving IV fluid as well as pain medication with some improvement.  Anticipate hospital admission for pancreatitis.  11:24 AM Appreciate consultation from [place] DR. Andria Frames who agrees to see and admit pt for further management of her acute pancreatitis.  Pt made NPO, IVF continues.  covid-19 screening test ordered.  BP 116/74 (BP Location: Left Arm)   Pulse 61   Temp 97.6 F (36.4 C) (Oral)   Resp 17   Ht 5\' 10"  (1.778 m)   Wt 66.7 kg   SpO2 96%   BMI 21.09 kg/m   Zoe Briggs was evaluated in Emergency Department on 08/22/2019 for the symptoms described in the history of present illness. She was evaluated in the context of the global COVID-19 pandemic, which necessitated consideration that the patient might be at risk for infection with the SARS-CoV-2 virus that causes COVID-19. Institutional protocols and algorithms that pertain to the evaluation of patients at risk for COVID-19 are in a state of rapid change based  on information released by regulatory bodies including the CDC and federal and state organizations. These policies and algorithms were followed during the patient's care in the ED.    Domenic Moras, PA-C 08/22/19 [place], MD 08/22/19 [phone]

## 2019-08-22 NOTE — ED Notes (Signed)
Pt provided cup of water

## 2019-08-22 NOTE — ED Notes (Signed)
ED PA at bedside with patient. 

## 2019-08-23 DIAGNOSIS — K8500 Idiopathic acute pancreatitis without necrosis or infection: Principal | ICD-10-CM

## 2019-08-23 DIAGNOSIS — K5900 Constipation, unspecified: Secondary | ICD-10-CM

## 2019-08-23 LAB — COMPREHENSIVE METABOLIC PANEL
ALT: 15 U/L (ref 0–44)
AST: 14 U/L — ABNORMAL LOW (ref 15–41)
Albumin: 3.4 g/dL — ABNORMAL LOW (ref 3.5–5.0)
Alkaline Phosphatase: 50 U/L (ref 38–126)
Anion gap: 5 (ref 5–15)
BUN: 9 mg/dL (ref 6–20)
CO2: 26 mmol/L (ref 22–32)
Calcium: 8.5 mg/dL — ABNORMAL LOW (ref 8.9–10.3)
Chloride: 109 mmol/L (ref 98–111)
Creatinine, Ser: 0.45 mg/dL (ref 0.44–1.00)
GFR calc Af Amer: >60 mL/min (ref >60)
GFR calc non Af Amer: >60 mL/min (ref >60)
Glucose, Bld: 89 mg/dL (ref 70–99)
Potassium: 3.7 mmol/L (ref 3.5–5.1)
Sodium: 140 mmol/L (ref 135–145)
Total Bilirubin: 0.4 mg/dL (ref 0.3–1.2)
Total Protein: 5.4 g/dL — ABNORMAL LOW (ref 6.5–8.1)

## 2019-08-23 LAB — CBC
HCT: 34.6 % — ABNORMAL LOW (ref 36.0–46.0)
Hemoglobin: 11.1 g/dL — ABNORMAL LOW (ref 12.0–15.0)
MCH: 31.4 pg (ref 26.0–34.0)
MCHC: 32.1 g/dL (ref 30.0–36.0)
MCV: 97.7 fL (ref 80.0–100.0)
Platelets: 128 10*3/uL — ABNORMAL LOW (ref 150–400)
RBC: 3.54 MIL/uL — ABNORMAL LOW (ref 3.87–5.11)
RDW: 11.9 % (ref 11.5–15.5)
WBC: 3.3 10*3/uL — ABNORMAL LOW (ref 4.0–10.5)
nRBC: 0 % (ref 0.0–0.2)

## 2019-08-23 MED ORDER — SALINE SPRAY 0.65 % NA SOLN
1.0000 | NASAL | Status: DC | PRN
  Filled 2019-08-23: qty 44

## 2019-08-23 MED ORDER — POLYETHYLENE GLYCOL 3350 17 G PO PACK
17.0000 g | PACK | Freq: Every day | ORAL | Status: DC
  Administered 2019-08-23: 17 g via ORAL
  Filled 2019-08-23 (×2): qty 1

## 2019-08-23 MED ORDER — POLYETHYLENE GLYCOL 3350 17 G PO PACK
17.0000 g | PACK | Freq: Every day | ORAL | Status: DC | PRN
  Administered 2019-08-23: 17 g via ORAL
  Filled 2019-08-23 (×2): qty 1

## 2019-08-23 MED ORDER — ZOLPIDEM TARTRATE 5 MG PO TABS
5.0000 mg | ORAL_TABLET | Freq: Every evening | ORAL | Status: DC | PRN
  Administered 2019-08-23: 5 mg via ORAL
  Filled 2019-08-23: qty 1

## 2019-08-23 NOTE — Progress Notes (Signed)
PROGRESS NOTE    Nicoline Bode    Code Status: Full Code  [id number] DOB: 10-08-1975 DOA: 08/22/2019  PCP: Gregor Hams, [facility] Summary  This is a 43 year old female with past medical history of rectal cancer status post colostomy, asthma, depression, idiopathic pancreatitis with recent hospitalization for pancreatitis who presented with recurrent epigastric abdominal pain found to have acute recurrent pancreatitis on admission lipase 7766.  She has been continued on supportive management with IV fluids, pain control and clear liquid diet.  A & P   Active Problems:   Acute pancreatitis   Acute idiopathic pancreatitis History of idiopathic pancreatitis with extensive negative work-up in the past and recent hospitalization for same at [facility] on 12/2.  Follows with Dr. Hassell Done, GI at [facility] health with recent visit 12/7.  Tolerating clear liquid diet and has an appetite -Continue IV fluids -Continue current pain regimen and bowel regimen while on opiates -Advance diet to full liquid diet  Depression -Continue nortriptyline and sertraline  Asthma stable -As needed inhaler  Constipation -Colace and MiraLAX  History of colorectal cancer in 20's status post colostomy stable  Thrombocytopenia likely from acute illness.  Platelets 128 -Monitor    DVT prophylaxis: Lovenox Diet: Full liquid diet Family Communication: No family at bedside Disposition Plan: Continue to advance diet as tolerated and pain regimen.  Possible DC pending clinical improvement  Consultants  None  Procedures  None  Antibiotics  None      Subjective   Patient seen and examined at bedside in no acute distress and resting comfortably. No acute events overnight.  Admits to tolerating clear liquid diet and has an appetite and ready to advance this afternoon.  Seen again this evening ambulating in the hall without issue.  Admits to constipation.  No other complaints  Objective    Vitals:   08/22/19 1404 08/22/19 1949 08/23/19 0519 08/23/19 1346  BP: (!) 138/95 (!) 135/98 109/66 139/87  Pulse: 78 75 65 73  Resp: 18 17 17 18   Temp: 97.6 F (36.4 C) 97.8 F (36.6 C) 97.7 F (36.5 C) (!) 97.5 F (36.4 C)  TempSrc: Oral Oral Oral Oral  SpO2: 100% 100% 100% 100%  Weight:   67.2 kg   Height:        Intake/Output Summary (Last 24 hours) at 08/23/2019 1602 Last data filed at 08/22/2019 1653 Gross per 24 hour  Intake 0 ml  Output --  Net 0 ml   Filed Weights   08/22/19 0654 08/23/19 0519  Weight: 66.7 kg 67.2 kg    Examination:  Physical Exam Vitals and nursing note reviewed.  Constitutional:      Appearance: Normal appearance.  HENT:     Head: Normocephalic and atraumatic.     Nose: Nose normal.     Mouth/Throat:     Mouth: Mucous membranes are moist.  Eyes:     Extraocular Movements: Extraocular movements intact.  Cardiovascular:     Rate and Rhythm: Normal rate and regular rhythm.  Pulmonary:     Effort: Pulmonary effort is normal.     Breath sounds: Normal breath sounds.  Abdominal:     General: Abdomen is flat.     Palpations: Abdomen is soft.     Comments: Mild epigastric tenderness Colostomy without stool  Musculoskeletal:        General: No swelling. Normal range of motion.     Cervical back: Normal range of motion. No rigidity.  Neurological:  General: No focal deficit present.     Mental Status: She is alert. Mental status is at baseline.  Psychiatric:        Mood and Affect: Mood normal.        Behavior: Behavior normal.     Data Reviewed: I have personally reviewed following labs and imaging studies  CBC: Recent Labs  Lab 08/22/19 0710 08/23/19 0630  WBC 5.5 3.3*  HGB 15.0 11.1*  HCT 45.2 34.6*  MCV 95.2 97.7  PLT 197 0000000*   Basic Metabolic Panel: Recent Labs  Lab 08/22/19 0710 08/23/19 0630  NA 139 140  K 3.4* 3.7  CL 103 109  CO2 23 26  GLUCOSE 140* 89  BUN 12 9  CREATININE 0.58 0.45  CALCIUM  9.9 8.5*   GFR: Estimated Creatinine Clearance: 94.2 mL/min (by C-G formula based on SCr of 0.45 mg/dL). Liver Function Tests: Recent Labs  Lab 08/22/19 0710 08/23/19 0630  AST 21 14*  ALT 16 15  ALKPHOS 69 50  BILITOT 0.6 0.4  PROT 7.3 5.4*  ALBUMIN 4.5 3.4*   Recent Labs  Lab 08/22/19 0710  LIPASE 7,766*   No results for input(s): AMMONIA in the last 168 hours. Coagulation Profile: No results for input(s): INR, PROTIME in the last 168 hours. Cardiac Enzymes: No results for input(s): CKTOTAL, CKMB, CKMBINDEX, TROPONINI in the last 168 hours. BNP (last 3 results) No results for input(s): PROBNP in the last 8760 hours. HbA1C: No results for input(s): HGBA1C in the last 72 hours. CBG: No results for input(s): GLUCAP in the last 168 hours. Lipid Profile: Recent Labs    08/22/19 0710  CHOL 228*  HDL 91  LDLCALC 124*  TRIG 66  CHOLHDL 2.5   Thyroid Function Tests: No results for input(s): TSH, T4TOTAL, FREET4, T3FREE, THYROIDAB in the last 72 hours. Anemia Panel: No results for input(s): VITAMINB12, FOLATE, FERRITIN, TIBC, IRON, RETICCTPCT in the last 72 hours. Sepsis Labs: No results for input(s): PROCALCITON, LATICACIDVEN in the last 168 hours.  Recent Results (from the past 240 hour(s))  SARS CORONAVIRUS 2 (TAT 6-24 HRS) Nasopharyngeal Nasopharyngeal Swab     Status: None   Collection Time: 08/22/19  7:19 AM   Specimen: Nasopharyngeal Swab  Result Value Ref Range Status   SARS Coronavirus 2 NEGATIVE NEGATIVE Final    Comment: (NOTE) SARS-CoV-2 target nucleic acids are NOT DETECTED. The SARS-CoV-2 RNA is generally detectable in upper and lower respiratory specimens during the acute phase of infection. Negative results do not preclude SARS-CoV-2 infection, do not rule out co-infections with other pathogens, and should not be used as the sole basis for treatment or other patient management decisions. Negative results must be combined with clinical  observations, patient history, and epidemiological information. The expected result is Negative. Fact Sheet for Patients: SugarRoll.be Fact Sheet for Healthcare Providers: https://www.woods-mathews.com/ This test is not yet approved or cleared by the [place] FDA and  has been authorized for detection and/or diagnosis of SARS-CoV-2 by FDA under an Emergency Use Authorization (EUA). This EUA will remain  in effect (meaning this test can be used) for the duration of the COVID-19 declaration under Section 56 4(b)(1) of the Act, 21 U.S.C. section 360bbb-3(b)(1), unless the authorization is terminated or revoked sooner. Performed at [facility], [place] [address] [place], [place] [address]          Radiology Studies: US Abdomen Limited  Result Date: 08/22/2019 CLINICAL DATA:  Abdominal pain for the last 6 hours.  History of pancreatitis. EXAM: ULTRASOUND ABDOMEN LIMITED RIGHT UPPER QUADRANT COMPARISON:  None. FINDINGS: Gallbladder: Moderately distended. No gallstones or wall thickening. No pericholecystic fluid. Common bile duct: Diameter: 3 mm Liver: No focal lesion identified. Within normal limits in parenchymal echogenicity. Portal vein is patent on color Doppler imaging with normal direction of blood flow towards the liver. Other: None. IMPRESSION: Normal right upper quadrant ultrasound. Electronically Signed   By: Lajean Manes M.D.   On: 08/22/2019 12:08        Scheduled Meds: . enoxaparin (LOVENOX) injection  40 mg Subcutaneous Q24H  . loratadine  10 mg Oral Daily  .  morphine injection  4 mg Intravenous Once  . multivitamin with minerals  1 tablet Oral Daily  . nortriptyline  35 mg Oral QHS  . polyethylene glycol  17 g Oral Daily  . sertraline  25 mg Oral Daily   Continuous Infusions: . sodium chloride 150 mL/hr at 08/23/19 1134     LOS: 1 day    Time spent: 25 minutes with over 50% of the time coordinating the  patient's care    Harold Hedge, DO [facility] Pager [phone]  If 7PM-7AM, please contact night-coverage [email or web address] Password [email or web address] 08/23/2019, 4:02 PM

## 2019-08-24 LAB — BASIC METABOLIC PANEL
Anion gap: 6 (ref 5–15)
BUN: 6 mg/dL (ref 6–20)
CO2: 26 mmol/L (ref 22–32)
Calcium: 9 mg/dL (ref 8.9–10.3)
Chloride: 108 mmol/L (ref 98–111)
Creatinine, Ser: 0.59 mg/dL (ref 0.44–1.00)
GFR calc Af Amer: >60 mL/min (ref >60)
GFR calc non Af Amer: >60 mL/min (ref >60)
Glucose, Bld: 86 mg/dL (ref 70–99)
Potassium: 4.3 mmol/L (ref 3.5–5.1)
Sodium: 140 mmol/L (ref 135–145)

## 2019-08-24 LAB — CBC
HCT: 36.7 % (ref 36.0–46.0)
Hemoglobin: 11.7 g/dL — ABNORMAL LOW (ref 12.0–15.0)
MCH: 31 pg (ref 26.0–34.0)
MCHC: 31.9 g/dL (ref 30.0–36.0)
MCV: 97.3 fL (ref 80.0–100.0)
Platelets: 137 10*3/uL — ABNORMAL LOW (ref 150–400)
RBC: 3.77 MIL/uL — ABNORMAL LOW (ref 3.87–5.11)
RDW: 11.9 % (ref 11.5–15.5)
WBC: 3.1 10*3/uL — ABNORMAL LOW (ref 4.0–10.5)
nRBC: 0 % (ref 0.0–0.2)

## 2019-08-24 LAB — HIV ANTIBODY (ROUTINE TESTING W REFLEX): HIV Screen 4th Generation wRfx: NONREACTIVE

## 2019-08-24 LAB — MAGNESIUM: Magnesium: 2.1 mg/dL (ref 1.7–2.4)

## 2019-08-24 LAB — LIPASE, BLOOD: Lipase: 66 U/L — ABNORMAL HIGH (ref 11–51)

## 2019-08-24 MED ORDER — OXYCODONE HCL 5 MG PO TABS: 5.0000 mg | ORAL_TABLET | Freq: Four times a day (QID) | ORAL | 0 refills | Status: AC | PRN

## 2019-08-24 NOTE — Discharge Summary (Signed)
Physician Discharge Summary  Zoe Briggs [id number] DOB: 07/14/1976 DOA: 08/22/2019  PCP: Gregor Hams, FNP  Admit date: 08/22/2019 Discharge date: 08/24/2019   Code Status: Full Code  Admitted From: home Discharged to:  home Home Health:no  Equipment/Devices:no  Discharge Condition:stable   Recommendations for Outpatient Follow-up   1. Follow up with GI 2. Advance diet. Patient discharged with prolonged full liquid diet as it is thought she advanced her diet too quickly prior to this admission from recent pancreatitis  Hospital Summary  This is a 43 year old female with past medical history of rectal cancer status post colostomy, asthma, depression, idiopathic pancreatitis with recent hospitalization for pancreatitis who presented with recurrent epigastric abdominal pain found to have acute recurrent pancreatitis on admission lipase 7766. Possibly from advancing diet too quickly from previous pancreatitis flare.  She has been continued on supportive management with IV fluids, pain control and advanced to full liquid diet and her symptoms significantly improved with lipase down to 66 at discharge. She was discharged on prolonged full liquid diet until she follows up with her physicians and 3 days of oxycodone q6h prn.    A & P   Active Problems:   Acute pancreatitis    Acute recurrent idiopathic pancreatitis History of idiopathic pancreatitis with extensive negative work-up in the past and recent hospitalization for same at [facility] on 12/2. Possibly advanced her diet too quickly from that admission.  Follows with Dr. Hassell Done, GI at [facility] health with recent visit 12/7.  ambulating well independently and tolerating full liquid diet. -continue full liquid diet for the next 1-2 weeks or until cleared by a physician -discharged with Oxycodone IR 5 mg Q6h prn x 3 days  Depression -Continue nortriptyline and sertraline  Asthma stable -As needed inhaler  Constipation -to  continue her outpatient bowel regimen  History of colorectal cancer in 20's status post colostomy stable  Thrombocytopenia likely from acute illness.  improved. Follow up outpatient.      Consultants  . none  Procedures  . none  Antibiotics  none   Subjective  Patient seen and examined at bedside no acute distress and resting comfortably.  No events overnight.  Tolerating full liquid diet. In good spirits and anticipating discharge. Admits to some abdominal soreness which is significantly improved. Ambulating well independently  Denies any chest pain, shortness of breath, fever, nausea, vomiting, urinary or bowel complaints. Otherwise ROS negative   Objective   Discharge Exam: Vitals:   08/23/19 2042 08/24/19 0453  BP: 127/82 118/81  Pulse: 75 64  Resp: 16 17  Temp: 97.7 F (36.5 C) (!) 97.4 F (36.3 C)  SpO2: 99% 99%   Vitals:   08/23/19 1346 08/23/19 2042 08/24/19 0452 08/24/19 0453  BP: 139/87 127/82  118/81  Pulse: 73 75  64  Resp: 18 16  17   Temp: (!) 97.5 F (36.4 C) 97.7 F (36.5 C)  (!) 97.4 F (36.3 C)  TempSrc: Oral Oral  Oral  SpO2: 100% 99%  99%  Weight:   72.4 kg   Height:        Physical Exam Vitals and nursing note reviewed.  Constitutional:      Appearance: Normal appearance.  HENT:     Head: Normocephalic and atraumatic.     Nose: Nose normal.     Mouth/Throat:     Mouth: Mucous membranes are moist.  Eyes:     Extraocular Movements: Extraocular movements intact.  Cardiovascular:     Rate and Rhythm: Normal rate  and regular rhythm.  Pulmonary:     Effort: Pulmonary effort is normal.     Breath sounds: Normal breath sounds.  Abdominal:     General: Abdomen is flat.     Palpations: Abdomen is soft.     Comments: colostomy  Musculoskeletal:        General: No swelling. Normal range of motion.     Cervical back: Normal range of motion. No rigidity.  Neurological:     General: No focal deficit present.     Mental Status:  She is alert. Mental status is at baseline.  Psychiatric:        Mood and Affect: Mood normal.        Behavior: Behavior normal.       The results of significant diagnostics from this hospitalization (including imaging, microbiology, ancillary and laboratory) are listed below for reference.     Microbiology: Recent Results (from the past 240 hour(s))  SARS CORONAVIRUS 2 (TAT 6-24 HRS) Nasopharyngeal Nasopharyngeal Swab     Status: None   Collection Time: 08/22/19  7:19 AM   Specimen: Nasopharyngeal Swab  Result Value Ref Range Status   SARS Coronavirus 2 NEGATIVE NEGATIVE Final    Comment: (NOTE) SARS-CoV-2 target nucleic acids are NOT DETECTED. The SARS-CoV-2 RNA is generally detectable in upper and lower respiratory specimens during the acute phase of infection. Negative results do not preclude SARS-CoV-2 infection, do not rule out co-infections with other pathogens, and should not be used as the sole basis for treatment or other patient management decisions. Negative results must be combined with clinical observations, patient history, and epidemiological information. The expected result is Negative. Fact Sheet for Patients: SugarRoll.be Fact Sheet for Healthcare Providers: https://www.woods-mathews.com/ This test is not yet approved or cleared by the [place] FDA and  has been authorized for detection and/or diagnosis of SARS-CoV-2 by FDA under an Emergency Use Authorization (EUA). This EUA will remain  in effect (meaning this test can be used) for the duration of the COVID-19 declaration under Section 56 4(b)(1) of the Act, 21 U.S.C. section 360bbb-3(b)(1), unless the authorization is terminated or revoked sooner. Performed at [facility], [place] [address] [place], [place] [address]      Labs: BNP (last 3 results) No results for input(s): BNP in the last 8760 hours. Basic Metabolic Panel: Recent Labs  Lab  08/22/19 0710 08/23/19 0630 08/24/19 0619  NA 139 140 140  K 3.4* 3.7 4.3  CL 103 109 108  CO2 23 26 26   GLUCOSE 140* 89 86  BUN 12 9 6   CREATININE 0.58 0.45 0.59  CALCIUM 9.9 8.5* 9.0  MG  --   --  2.1   Liver Function Tests: Recent Labs  Lab 08/22/19 0710 08/23/19 0630  AST 21 14*  ALT 16 15  ALKPHOS 69 50  BILITOT 0.6 0.4  PROT 7.3 5.4*  ALBUMIN 4.5 3.4*   Recent Labs  Lab 08/22/19 0710 08/24/19 0619  LIPASE 7,766* 66*   No results for input(s): AMMONIA in the last 168 hours. CBC: Recent Labs  Lab 08/22/19 0710 08/23/19 0630 08/24/19 0619  WBC 5.5 3.3* 3.1*  HGB 15.0 11.1* 11.7*  HCT 45.2 34.6* 36.7  MCV 95.2 97.7 97.3  PLT 197 128* 137*   Cardiac Enzymes: No results for input(s): CKTOTAL, CKMB, CKMBINDEX, TROPONINI in the last 168 hours. BNP: Invalid input(s): POCBNP CBG: No results for input(s): GLUCAP in the last 168 hours. D-Dimer No results for input(s): DDIMER in  the last 72 hours. Hgb A1c No results for input(s): HGBA1C in the last 72 hours. Lipid Profile Recent Labs    08/22/19 0710  CHOL 228*  HDL 91  LDLCALC 124*  TRIG 66  CHOLHDL 2.5   Thyroid function studies No results for input(s): TSH, T4TOTAL, T3FREE, THYROIDAB in the last 72 hours.  Invalid input(s): FREET3 Anemia work up No results for input(s): VITAMINB12, FOLATE, FERRITIN, TIBC, IRON, RETICCTPCT in the last 72 hours. Urinalysis    Component Value Date/Time   COLORURINE STRAW (A) 08/22/2019 0656   APPEARANCEUR CLEAR 08/22/2019 0656   LABSPEC 1.005 08/22/2019 0656   PHURINE 6.0 08/22/2019 0656   GLUCOSEU NEGATIVE 08/22/2019 0656   GLUCOSEU NEGATIVE 06/03/2012 0948   HGBUR NEGATIVE 08/22/2019 0656   BILIRUBINUR NEGATIVE 08/22/2019 0656   BILIRUBINUR Neg 08/18/2013 1639   KETONESUR NEGATIVE 08/22/2019 0656   PROTEINUR NEGATIVE 08/22/2019 0656   UROBILINOGEN 0.2 08/18/2013 1639   UROBILINOGEN 0.2 06/25/2012 0500   NITRITE NEGATIVE 08/22/2019 0656   LEUKOCYTESUR  NEGATIVE 08/22/2019 0656   Sepsis Labs Invalid input(s): PROCALCITONIN,  WBC,  LACTICIDVEN Microbiology Recent Results (from the past 240 hour(s))  SARS CORONAVIRUS 2 (TAT 6-24 HRS) Nasopharyngeal Nasopharyngeal Swab     Status: None   Collection Time: 08/22/19  7:19 AM   Specimen: Nasopharyngeal Swab  Result Value Ref Range Status   SARS Coronavirus 2 NEGATIVE NEGATIVE Final    Comment: (NOTE) SARS-CoV-2 target nucleic acids are NOT DETECTED. The SARS-CoV-2 RNA is generally detectable in upper and lower respiratory specimens during the acute phase of infection. Negative results do not preclude SARS-CoV-2 infection, do not rule out co-infections with other pathogens, and should not be used as the sole basis for treatment or other patient management decisions. Negative results must be combined with clinical observations, patient history, and epidemiological information. The expected result is Negative. Fact Sheet for Patients: SugarRoll.be Fact Sheet for Healthcare Providers: https://www.woods-mathews.com/ This test is not yet approved or cleared by the [place] FDA and  has been authorized for detection and/or diagnosis of SARS-CoV-2 by FDA under an Emergency Use Authorization (EUA). This EUA will remain  in effect (meaning this test can be used) for the duration of the COVID-19 declaration under Section 56 4(b)(1) of the Act, 21 U.S.C. section 360bbb-3(b)(1), unless the authorization is terminated or revoked sooner. Performed at [facility], [place] [address] [place], [place] [address]     Discharge Instructions     Discharge Instructions    Diet full liquid   Complete by: As directed    Discharge instructions   Complete by: As directed    You were seen and examined in the hospital for pancreatitis and cared for by a hospitalist.   Upon Discharge:  -Continue a full liquid diet as we discussed until you follow up with  your GI doctor or primary care doctor -Take oxycodone 5 mg every 6 hours as needed for pain over the next 3 days -Follow up with your GI doctor in 2 -4 weeks Make an appointment with your primary care physician within 7 days  Bring all home medications to your appointment to review Request that your primary physician go over all hospital tests and procedures/radiological results at the follow up.   Please get all hospital records sent to your physician by signing a hospital release before you go home.     Read the complete instructions along with all the possible side effects for all the medicines you take  and that have been prescribed to you. Take any new medicines after you have completely understood and accept all the possible adverse reactions/side effects.   If you have any questions about your discharge medications or the care you received while you were in the hospital, you can call the unit and asked to speak with the hospitalist on call. Once you are discharged, your primary care physician will handle any further medical issues. Please note that NO REFILLS for any discharge medications will be authorized, as it is imperative that you return to your primary care physician (or establish a relationship with a primary care physician if you do not have one) for your aftercare needs so that they can reassess your need for medications and monitor your lab values.   Do not drive, operate heavy machinery, perform activities at heights, swimming or participation in water activities or provide baby sitting services if your were admitted for loss of consciousness/seizures or if you are on sedating medications including, but not limited to benzodiazepines, sleep medications, narcotic pain medications, etc., until you have been cleared to do so by a medical doctor.   Do not take more than prescribed medications.   Wear a seat belt while driving.  If you have smoked or chewed Tobacco in the last 2  years please stop smoking; also stop any regular Alcohol and/or any Recreational drug use including marijuana.  If you experience worsening of your admission symptoms or develop shortness of breath, chest pain, suicidal or homicidal thoughts or experience a life threatening emergency, you must seek medical attention immediately by calling 911 or calling your PCP immediately.   Increase activity slowly   Complete by: As directed      Allergies as of 08/24/2019      Reactions   Ciprofloxacin Nausea Only   Sulfa Antibiotics Other (See Comments)   abd pain   Imitrex [sumatriptan] Rash   Iodinated Diagnostic Agents Rash   Only when given together with chemo      Medication List    TAKE these medications   acetaminophen 325 MG tablet Commonly known as: TYLENOL Take 650 mg by mouth every 6 (six) hours as needed for mild pain or headache.   Ajovy 225 MG/1.5ML Sosy Generic drug: Fremanezumab-vfrm Inject 225 mg into the skin every 30 (thirty) days.   azelastine 0.1 % nasal spray Commonly known as: ASTELIN Place 2 sprays into both nostrils 2 (two) times daily as needed for rhinitis. Use in each nostril as directed   cetirizine 10 MG tablet Commonly known as: ZYRTEC Take 10 mg by mouth daily.   Climara Pro 0.045-0.015 MG/DAY Generic drug: estradiol-levonorgestrel Place 1 patch onto the skin once a week.   levalbuterol 45 MCG/ACT inhaler Commonly known as: Xopenex HFA Inhale 2 puffs into the lungs every 8 (eight) hours as needed for wheezing.   MI-OMEGA PO Take 1 capsule by mouth 2 (two) times daily.   multivitamin tablet Take 1 tablet by mouth daily.   nortriptyline 10 MG capsule Commonly known as: PAMELOR TAKE 1 CAPSULE (10 MG TOTAL) BY MOUTH AT BEDTIME. What changed: See the new instructions.   nortriptyline 25 MG capsule Commonly known as: PAMELOR Take 25 mg by mouth at bedtime. (Take with 10mg  capsule for a total of 35mg ) What changed: Another medication with the  same name was changed. Make sure you understand how and when to take each.   oxyCODONE 5 MG immediate release tablet Commonly known as: Oxy IR/ROXICODONE Take 1 tablet (  5 mg total) by mouth every 6 (six) hours as needed for up to 3 days for moderate pain.   promethazine 25 MG tablet Commonly known as: PHENERGAN Take 1 tablet (25 mg total) by mouth every 6 (six) hours as needed for nausea or vomiting.   rizatriptan 10 MG tablet Commonly known as: MAXALT Take 10 mg by mouth See admin instructions. Take one tablet by mouth at onset of migraine. May repeat in 2 hours if needed.   sertraline 25 MG tablet Commonly known as: ZOLOFT Take 25 mg by mouth daily.   Uribel 118 MG Caps Take 118 mg by mouth daily as needed (bladder spasms).       Allergies  Allergen Reactions  . Ciprofloxacin Nausea Only  . Sulfa Antibiotics Other (See Comments)    abd pain  . Imitrex [Sumatriptan] Rash  . Iodinated Diagnostic Agents Rash    Only when given together with chemo    Time coordinating discharge: under 30 minutes  SIGNED:   Harold Hedge, D.O. [facility] Pager: [phone]  08/24/2019, 9:44 AM

## 2019-08-24 NOTE — Consult Note (Signed)
[place] Nurse ostomy consult note Pt has colostomy which has been present many years. She states she performs pouching activities independently and is preparing for discharge. She denies any questions regarding the ostomy or the need for further assistance or pouching supplies. Please re-consult if further assistance is needed.  Thank-you,  Julien Girt MSN, [place], [place], [place], [place]

## 2019-08-24 NOTE — Discharge Instructions (Signed)
Acute Pancreatitis  The pancreas is a gland that is located behind the stomach on the left side of the abdomen. It produces enzymes that help to digest food. The pancreas also releases the hormones glucagon and insulin, which help to regulate blood sugar. Acute pancreatitis happens when inflammation of the pancreas suddenly occurs and the pancreas becomes irritated and swollen. Most acute attacks last a few days and cause serious problems. Some people become dehydrated and develop low blood pressure. In severe cases, bleeding in the abdomen can lead to shock and can be life-threatening. The lungs, heart, and kidneys may fail. What are the causes? This condition may be caused by:  Alcohol abuse.  Drug abuse.  Gallstones or other conditions that can block the tube that drains the pancreas (pancreatic duct).  A tumor in the pancreas. Other causes include:  Certain medicines.  Exposure to certain chemicals.  Diabetes.  An infection in the pancreas.  Damage caused by an accident (trauma).  The poison (venom) from a scorpion bite.  Abdominal surgery.  Autoimmune pancreatitis. This is when the body's disease-fighting (immune) system attacks the pancreas.  Genes that are passed from parent to child (inherited). In some cases, the cause of this condition is not known. What are the signs or symptoms? Symptoms of this condition include:  Pain in the upper abdomen that may radiate to the back. Pain may be severe.  Tenderness and swelling of the abdomen.  Nausea and vomiting.  Fever. How is this diagnosed? This condition may be diagnosed based on:  A physical exam.  Blood tests.  Imaging tests, such as X-rays, CT or MRI scans, or an ultrasound of the abdomen. How is this treated? Treatment for this condition usually requires a stay in the hospital. Treatment for this condition may include:  Pain medicine.  Fluid replacement through an IV.  Placing a tube in the stomach  to remove stomach contents and to control vomiting (NG tube, or nasogastric tube).  Not eating for 3-4 days. This gives the pancreas a rest, because enzymes are not being produced that can cause further damage.  Antibiotic medicines, if your condition is caused by an infection.  Treating any underlying conditions that may be the cause.  Steroid medicines, if your condition is caused by your immune system attacking your body's own tissues (autoimmune disease).  Surgery on the pancreas or gallbladder. Follow these instructions at home: Eating and drinking   Follow instructions from your health care provider about diet. This may involve avoiding alcohol and decreasing the amount of fat in your diet.  Eat smaller, more frequent meals. This reduces the amount of digestive fluids that the pancreas produces.  Drink enough fluid to keep your urine pale yellow.  Do not drink alcohol if it caused your condition. General instructions  Take over-the-counter and prescription medicines only as told by your health care provider.  Do not drive or use heavy machinery while taking prescription pain medicine.  Ask your health care provider if the medicine prescribed to you can cause constipation. You may need to take steps to prevent or treat constipation, such as: ? Take an over-the-counter or prescription medicine for constipation. ? Eat foods that are high in fiber such as whole grains and beans. ? Limit foods that are high in fat and processed sugars, such as fried or sweet foods.  Do not use any products that contain nicotine or tobacco, such as cigarettes, e-cigarettes, and chewing tobacco. If you need help quitting, ask your   health care provider.  Get plenty of rest.  If directed, check your blood sugar at home as told by your health care provider.  Keep all follow-up visits as told by your health care provider. This is important. Contact a health care provider if you:  Do not recover  as quickly as expected.  Develop new or worsening symptoms.  Have persistent pain, weakness, or nausea.  Recover and then have another episode of pain.  Have a fever. Get help right away if:  You cannot eat or keep fluids down.  Your pain becomes severe.  Your skin or the white part of your eyes turns yellow (jaundice).  You have sudden swelling in your abdomen.  You vomit.  You feel dizzy or you faint.  Your blood sugar is high (over 300 mg/dL). Summary  Acute pancreatitis happens when inflammation of the pancreas suddenly occurs and the pancreas becomes irritated and swollen.  This condition is typically caused by alcohol abuse, drug abuse, or gallstones.  Treatment for this condition usually requires a stay in the hospital. This information is not intended to replace advice given to you by your health care provider. Make sure you discuss any questions you have with your health care provider. Document Released: 08/27/2005 Document Revised: 06/16/2018 Document Reviewed: 03/03/2018 Elsevier Patient Education  2020 [employer].

## 2019-09-13 ENCOUNTER — Other Ambulatory Visit: Payer: Self-pay

## 2019-09-13 ENCOUNTER — Emergency Department (HOSPITAL_COMMUNITY)
Admission: EM | Admit: 2019-09-13 | Discharge: 2019-09-13 | Disposition: A | Discharge status: Home / Self Care | Payer: BC Managed Care – PPO | Attending: Emergency Medicine | Admitting: Emergency Medicine

## 2019-09-13 ENCOUNTER — Encounter (HOSPITAL_COMMUNITY): Payer: Self-pay

## 2019-09-13 DIAGNOSIS — Z5321 Procedure and treatment not carried out due to patient leaving prior to being seen by health care provider: Secondary | ICD-10-CM | POA: Insufficient documentation

## 2019-09-13 DIAGNOSIS — R1010 Upper abdominal pain, unspecified: Secondary | ICD-10-CM | POA: Diagnosis present

## 2019-09-13 LAB — COMPREHENSIVE METABOLIC PANEL
ALT: 17 U/L (ref 0–44)
AST: 22 U/L (ref 15–41)
Albumin: 4.3 g/dL (ref 3.5–5.0)
Alkaline Phosphatase: 75 U/L (ref 38–126)
Anion gap: 14 (ref 5–15)
BUN: 15 mg/dL (ref 6–20)
CO2: 21 mmol/L — ABNORMAL LOW (ref 22–32)
Calcium: 9.7 mg/dL (ref 8.9–10.3)
Chloride: 104 mmol/L (ref 98–111)
Creatinine, Ser: 0.64 mg/dL (ref 0.44–1.00)
GFR calc Af Amer: >60 mL/min (ref >60)
GFR calc non Af Amer: >60 mL/min (ref >60)
Glucose, Bld: 165 mg/dL — ABNORMAL HIGH (ref 70–99)
Potassium: 3.6 mmol/L (ref 3.5–5.1)
Sodium: 139 mmol/L (ref 135–145)
Total Bilirubin: 0.7 mg/dL (ref 0.3–1.2)
Total Protein: 7.1 g/dL (ref 6.5–8.1)

## 2019-09-13 LAB — I-STAT BETA HCG BLOOD, ED (MC, WL, AP ONLY): I-stat hCG, quantitative: <5 m[IU]/mL (ref <5)

## 2019-09-13 LAB — CBC
HCT: 44.6 % (ref 36.0–46.0)
Hemoglobin: 14.8 g/dL (ref 12.0–15.0)
MCH: 31.5 pg (ref 26.0–34.0)
MCHC: 33.2 g/dL (ref 30.0–36.0)
MCV: 94.9 fL (ref 80.0–100.0)
Platelets: 227 10*3/uL (ref 150–400)
RBC: 4.7 MIL/uL (ref 3.87–5.11)
RDW: 12.2 % (ref 11.5–15.5)
WBC: 4.5 10*3/uL (ref 4.0–10.5)
nRBC: 0 % (ref 0.0–0.2)

## 2019-09-13 LAB — LIPASE, BLOOD: Lipase: 8208 U/L — ABNORMAL HIGH (ref 11–51)

## 2019-09-13 MED ORDER — SODIUM CHLORIDE 0.9% FLUSH: 3.0000 mL | Freq: Once | INTRAVENOUS | Status: DC

## 2019-09-13 NOTE — ED Triage Notes (Signed)
Pt reports upper abdominal pain that started around 445a. She reports that it is pancreatitis. Denies vomiting or diarrhea. Pt is dry heaving and tachypneic.

## 2019-09-13 NOTE — ED Notes (Addendum)
Patient told registration that she does not want to wait anymore. She states that she will go somewhere else.

## 2019-10-01 DIAGNOSIS — K859 Acute pancreatitis without necrosis or infection, unspecified: Secondary | ICD-10-CM | POA: Insufficient documentation

## 2019-10-09 DIAGNOSIS — Z933 Colostomy status: Secondary | ICD-10-CM | POA: Insufficient documentation

## 2019-12-03 ENCOUNTER — Ambulatory Visit: Payer: BC Managed Care – PPO | Attending: Internal Medicine

## 2019-12-03 DIAGNOSIS — Z23 Encounter for immunization: Secondary | ICD-10-CM

## 2019-12-03 NOTE — Progress Notes (Signed)
   Covid-19 Vaccination Clinic  Name:  Nakeshia Pizzimenti    MRN: [id number] DOB: [birth year]  12/03/2019  Ms. Canale was observed post Covid-19 immunization for 15 minutes without incident. She was provided with Vaccine Information Sheet and instruction to access the V-Safe system.   Ms. Weisman was instructed to call 911 with any severe reactions post vaccine: Marland Kitchen Difficulty breathing  . Swelling of face and throat  . A fast heartbeat  . A bad rash all over body  . Dizziness and weakness   Immunizations Administered    Name Date Dose VIS Date Route   Pfizer COVID-19 Vaccine 12/03/2019  4:25 PM 0.3 mL 08/21/2019 Intramuscular   Manufacturer: [place]   Lot: [id number]   [place]: [id number]

## 2019-12-04 HISTORY — PX: ERCP: SHX60

## 2019-12-18 DIAGNOSIS — M79641 Pain in right hand: Secondary | ICD-10-CM | POA: Insufficient documentation

## 2019-12-28 ENCOUNTER — Ambulatory Visit: Payer: BC Managed Care – PPO | Attending: Internal Medicine

## 2019-12-28 DIAGNOSIS — Z23 Encounter for immunization: Secondary | ICD-10-CM

## 2019-12-28 NOTE — Progress Notes (Signed)
   Covid-19 Vaccination Clinic  Name:  Zoe Briggs    MRN: [id number] DOB: June 19, 1976  12/28/2019  [name] was observed post Covid-19 immunization for 15 minutes without incident. Pt was concerned about her BP. RN promptly checked her BP which was 146/75, HR 90. Pt agreed to wait around for an additional 15 minutes of observation. RN re-checked her BP after the 15 minutes, and her BP was 134/74, HR 95, RR 14. Pt states that she feels okay, denies SOB, HA, dizziness and states that her husband will be with her for the afternoon. She was provided with Vaccine Information Sheet and instruction to access the V-Safe system.   [name] was instructed to call 911 with any severe reactions post vaccine: Marland Kitchen Difficulty breathing  . Swelling of face and throat  . A fast heartbeat  . A bad rash all over body  . Dizziness and weakness   Immunizations Administered    Name Date Dose VIS Date Route   Pfizer COVID-19 Vaccine 12/28/2019  4:19 PM 0.3 mL 11/04/2018 Intramuscular   Manufacturer: [place]   Lot: [id number]   [place]: [id number]

## 2020-03-16 ENCOUNTER — Other Ambulatory Visit: Payer: Self-pay

## 2020-03-16 ENCOUNTER — Ambulatory Visit (INDEPENDENT_AMBULATORY_CARE_PROVIDER_SITE_OTHER): Payer: BC Managed Care – PPO

## 2020-03-16 ENCOUNTER — Ambulatory Visit: Payer: BC Managed Care – PPO

## 2020-03-16 ENCOUNTER — Ambulatory Visit
Admission: EM | Admit: 2020-03-16 | Discharge: 2020-03-16 | Disposition: A | Discharge status: Home / Self Care | Payer: BC Managed Care – PPO

## 2020-03-16 DIAGNOSIS — S90851A Superficial foreign body, right foot, initial encounter: Secondary | ICD-10-CM

## 2020-03-16 DIAGNOSIS — S91311A Laceration without foreign body, right foot, initial encounter: Secondary | ICD-10-CM

## 2020-03-16 DIAGNOSIS — S91321A Laceration with foreign body, right foot, initial encounter: Secondary | ICD-10-CM

## 2020-03-16 DIAGNOSIS — M79671 Pain in right foot: Secondary | ICD-10-CM | POA: Diagnosis not present

## 2020-03-16 MED ORDER — AMOXICILLIN-POT CLAVULANATE 875-125 MG PO TABS: 1.0000 | ORAL_TABLET | Freq: Two times a day (BID) | ORAL | 0 refills | Status: AC

## 2020-03-16 MED ORDER — TETANUS-DIPHTH-ACELL PERTUSSIS 5-2.5-18.5 LF-MCG/0.5 IM SUSP
0.5000 mL | Freq: Once | INTRAMUSCULAR | Status: AC
  Administered 2020-03-16: 0.5 mL via INTRAMUSCULAR

## 2020-03-16 NOTE — ED Triage Notes (Signed)
Pt states she cut the bottom of her rt foot while at the pool 9 days ago. States went to the beach a few days ago and now the area is red and painful.

## 2020-03-16 NOTE — Discharge Instructions (Addendum)
Keep area(s) clean and dry. Take antibiotic as prescribed with food - important to complete course. Return for worsening pain, redness, swelling, discharge, fever. 

## 2020-03-16 NOTE — ED Provider Notes (Signed)
[name]    CSN: [id number] Arrival date & time: 03/16/20  1745      History   Chief Complaint Chief Complaint  Patient presents with  . Laceration    HPI Zoe Briggs is a 43 y.o. female presenting for right foot laceration and pain.  States this occurred 9 days ago: Unaware of stepping on something.  Last tetanus unknown.  Has been having persistent, worsening pain with mild discharge.  No fever, arthralgias, myalgias.   Past Medical History:  Diagnosis Date  . Allergy   . Asthma    Seasonal  . Colon cancer ([place]) 2005  . Depression   . IC (interstitial cystitis)   . Migraines   . Pancreatitis     Patient Active Problem List   Diagnosis Date Noted  . Acute pancreatitis 08/22/2019  . Hair loss 08/18/2013  . 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 ADD (attention deficit disorder) 07/08/2012  . Asthma 05/27/2012  . Depression 05/27/2012  . Colon cancer ([place]) 05/27/2012  . Seasonal allergies 05/27/2012    Past Surgical History:  Procedure Laterality Date  . COLON SURGERY    . FOOT SURGERY    . TONSILLECTOMY AND ADENOIDECTOMY      OB History   No obstetric history on file.      Home Medications    Prior to Admission medications   Medication Sig Start Date End Date Taking? Authorizing Provider  estradiol-norethindrone [facility] [address]) 0.05-0.14 MG/DAY Place 1 patch onto the skin 2 (two) times a week.   Yes [provider]  fexofenadine (ALLEGRA) 60 MG tablet Take 60 mg by mouth 2 (two) times daily.   Yes [provider]  gabapentin (NEURONTIN) 100 MG capsule Take 100 mg by mouth at bedtime.   Yes [provider]  acetaminophen (TYLENOL) 325 MG tablet Take 650 mg by mouth every 6 (six) hours as needed for mild pain or headache.    [provider]  AJOVY 225 MG/1.5ML SOSY Inject 225 mg into the skin every 30 (thirty) days. 08/07/19   [provider]  amoxicillin-clavulanate (AUGMENTIN) 875-125 MG tablet Take 1  tablet by mouth every 12 (twelve) hours for 5 days. 03/16/20 03/21/20  Hall-Potvin, [place], PA-C  azelastine (ASTELIN) 0.1 % nasal spray Place 2 sprays into both nostrils 2 (two) times daily as needed for rhinitis. Use in each nostril as directed    [provider]  Folic WLNL-[id number]-HERDE 3 Acid (MI-OMEGA PO) Take 1 capsule by mouth 2 (two) times daily.    [provider]  levalbuterol Penne Lash HFA) 45 MCG/ACT inhaler Inhale 2 puffs into the lungs every 8 (eight) hours as needed for wheezing. 08/13/13   Ann Held, DO  Meth-Hyo-M Bl-Na Phos-Ph Sal (URIBEL) 118 MG CAPS Take 118 mg by mouth daily as needed (bladder spasms).  02/22/16   [provider]  Multiple Vitamin (MULTIVITAMIN) tablet Take 1 tablet by mouth daily.    [provider]  nortriptyline (PAMELOR) 10 MG capsule TAKE 1 CAPSULE (10 MG TOTAL) BY MOUTH AT BEDTIME. Patient taking differently: Take 10 mg by mouth at bedtime. (Takes with 25mg  capsule for a total of 35mg ) 06/23/14   Pieter Partridge, DO  nortriptyline (PAMELOR) 25 MG capsule Take 25 mg by mouth at bedtime. (Take with 10mg  capsule for a total of 35mg ) 03/24/18   [provider]  sertraline (ZOLOFT) 25 MG tablet Take 50 mg by mouth daily.  04/12/18   [provider]  Family History Family History  Problem Relation Age of Onset  . Arthritis Mother   . Heart disease Mother        Lorain Childes in the heart  . Hypertension Mother   . Cancer Paternal Aunt        brain  . Cancer Paternal Grandfather        skin    Social History Social History   Tobacco Use  . Smoking status: Never Smoker  . Smokeless tobacco: Never Used  Substance Use Topics  . Alcohol use: Not Currently    Comment: Occ  . Drug use: No     Allergies   Ciprofloxacin, Sulfa antibiotics, Imitrex [sumatriptan], and Iodinated diagnostic agents   Review of Systems As per HPI   Physical Exam Triage Vital Signs ED Triage Vitals  Enc Vitals  Group     BP      Pulse      Resp      Temp      Temp src      SpO2      Weight      Height      Head Circumference      Peak Flow      Pain Score      Pain Loc      Pain Edu?      Excl. in [place]?    No data found.  Updated Vital Signs BP 126/82 (BP Location: Left Arm)   Pulse 94   Temp 98.4 F (36.9 C) (Oral)   Resp 18   SpO2 97%   Visual Acuity Right Eye Distance:   Left Eye Distance:   Bilateral Distance:    Right Eye Near:   Left Eye Near:    Bilateral Near:     Physical Exam Constitutional:      General: She is not in acute distress. HENT:     Head: Normocephalic and atraumatic.  Eyes:     General: No scleral icterus.    Pupils: Pupils are equal, round, and reactive to light.  Cardiovascular:     Rate and Rhythm: Normal rate.  Pulmonary:     Effort: Pulmonary effort is normal.  Skin:    Coloration: Skin is not jaundiced or pale.     Comments: Tenderness over ball of right foot with small laceration noted.  No obvious foreign body.  NVI  Neurological:     Mental Status: She is alert and oriented to person, place, and time.      UC Treatments / Results  Labs (all labs ordered are listed, but only abnormal results are displayed) Labs Reviewed - No data to display  EKG   Radiology DG Foot Complete Right  Result Date: 03/16/2020 CLINICAL DATA:  Status post foreign body removal EXAM: RIGHT FOOT COMPLETE - 3+ VIEW COMPARISON:  03/17/2019 FINDINGS: Fixating wire at the first proximal phalanx. Postsurgical or posttraumatic deformity of the distal first metatarsal. No acute fracture or malalignment. Removal of previously noted radiopaque linear foreign body from the distal sole of the foot. Persistent small rectangular possible foreign body at the mid sole of the foot. IMPRESSION: 1. Removal of previously noted linear foreign body from the distal plantar surface of the foot 2. Persistent small rectangular possible foreign body at the mid sole of the foot.  Electronically Signed   By: Donavan Foil M.D.   On: 03/16/2020 19:55   DG Foot Complete Right  Result Date: 03/16/2020 CLINICAL DATA:  Possible foreign body laceration  EXAM: RIGHT FOOT COMPLETE - 3+ VIEW COMPARISON:  None. FINDINGS: No fracture or malalignment. 4 mm linear foreign body within the plantar soft tissues of the distal foot at the level of the metatarsal heads. Additional possible small radiopaque foreign body at the mid sole measuring 2 mm. Cerclage wire within the first proximal phalanx. IMPRESSION: 1. 4 mm linear foreign body within the plantar soft tissues of the distal foot at the level of the metatarsal heads. 2. Possible 2 mm foreign body at the mid sole. Electronically Signed   By: Donavan Foil M.D.   On: 03/16/2020 19:10    Procedures Foreign Body Removal  Date/Time: 03/17/2020 8:57 AM Performed by: Quincy Sheehan, PA-C Authorized by: Quincy Sheehan, PA-C   Consent:    Consent obtained:  Verbal   Consent given by:  Patient   Risks discussed:  Incomplete removal, bleeding, infection, pain and worsening of condition   Alternatives discussed:  No treatment, observation and referral Location:    Location:  Foot   Foot location:  R sole   Depth:  Subcutaneous   Tendon involvement:  None Pre-procedure details:    Imaging:  X-ray   Neurovascular status: intact     Preparation: Patient was prepped and draped in usual sterile fashion   Anesthesia (see MAR for exact dosages):    Anesthesia method: ice. Procedure type:    Procedure complexity:  Simple Procedure details:    Localization method:  Probed   Dissection of underlying tissues: no     Bloodless field: yes     Removal mechanism:  Forceps   Foreign bodies recovered:  1   Description:  64mm glass shard   Intact foreign body removal: yes   Post-procedure details:    Neurovascular status: intact     Confirmation:  No additional foreign bodies on visualization and complete removal verified with  imaging   Skin closure:  None   Dressing:  Non-adherent dressing   Patient tolerance of procedure:  Tolerated well, no immediate complications   (including critical care time)  Medications Ordered in UC Medications  Tdap (BOOSTRIX) injection 0.5 mL (0.5 mLs Intramuscular Given 03/16/20 1811)    Initial Impression / Assessment and Plan / UC Course  I have reviewed the triage vital signs and the nursing notes.  Pertinent labs & imaging results that were available during my care of the patient were reviewed by me and considered in my medical decision making (see chart for details).     Patient afebrile, nontoxic in office today.  Tetanus updated.  X-ray done in office, reviewed by me radiology: Significant for 4 mm linear foreign body within plantar soft tissues of the distal foot level of metatarsal head.  Possible 2 mm foreign body at mid sole.  Discussed findings with patient verbalized understanding.  Patient not having any pain in mid sole, and this possible foreign body is deeper: We will defer retrieval of that object.  Foreign body removal successful in office per repeat radiography.  Patient tolerated this well.  Given extent of retrieval, will provide short course of Augmentin. Return precautions discussed, patient verbalized understanding and is agreeable to plan. Final Clinical Impressions(s) / UC Diagnoses   Final diagnoses:  Foot laceration, right, initial encounter  Foreign body in right foot, initial encounter     Discharge Instructions     Keep area(s) clean and dry. Take antibiotic as prescribed with food - important to complete course. Return for worsening pain, redness, swelling, discharge, fever.  ED Prescriptions    Medication Sig Dispense Auth. Provider   amoxicillin-clavulanate (AUGMENTIN) 875-125 MG tablet Take 1 tablet by mouth every 12 (twelve) hours for 5 days. 10 tablet Hall-Potvin, [place], PA-C     PDMP not reviewed this encounter.     Hall-Potvin, [place], [place] 03/17/20 [phone]

## 2020-03-17 ENCOUNTER — Encounter: Payer: Self-pay | Admitting: Emergency Medicine

## 2020-03-17 DIAGNOSIS — S90851A Superficial foreign body, right foot, initial encounter: Secondary | ICD-10-CM | POA: Diagnosis not present

## 2020-10-13 DIAGNOSIS — S76311D Strain of muscle, fascia and tendon of the posterior muscle group at thigh level, right thigh, subsequent encounter: Secondary | ICD-10-CM | POA: Diagnosis not present

## 2020-10-18 DIAGNOSIS — J4521 Mild intermittent asthma with (acute) exacerbation: Secondary | ICD-10-CM | POA: Diagnosis not present

## 2020-11-10 ENCOUNTER — Other Ambulatory Visit: Payer: Self-pay

## 2020-11-10 ENCOUNTER — Encounter: Payer: Self-pay | Admitting: Emergency Medicine

## 2020-11-10 ENCOUNTER — Ambulatory Visit
Admission: EM | Admit: 2020-11-10 | Discharge: 2020-11-10 | Disposition: A | Discharge status: Home / Self Care | Payer: BC Managed Care – PPO | Attending: Family Medicine | Admitting: Family Medicine

## 2020-11-10 DIAGNOSIS — S61012A Laceration without foreign body of left thumb without damage to nail, initial encounter: Secondary | ICD-10-CM | POA: Diagnosis not present

## 2020-11-10 NOTE — Discharge Instructions (Signed)
Follow-up in 7 to 10 days for wound recheck and possible suture removal

## 2020-11-10 NOTE — ED Triage Notes (Signed)
Laceration to left thumb while cooking tonight.  Bleeding controlled with dressing.  Patient is able to move thumb

## 2020-11-14 NOTE — ED Provider Notes (Signed)
Jay    CSN: [id number] Arrival date & time: 11/10/20  1750      History   Chief Complaint Chief Complaint  Patient presents with  . Laceration    HPI Zoe Briggs is a 45 y.o. female.   Patient here today with left thumb laceration that she sustained about an hour ago while prepping dinner. States she cut the finger with a kitchen knife on accident. Bleeding has been fairly well controlled with direct pressure, however begins bleeding again if she bends the finger. Pain to the area, but no numbness, tingling, significant swelling, discoloration. Up-to-date on tetanus, last given 03/2020.     Past Medical History:  Diagnosis Date  . Allergy   . Asthma    Seasonal  . Colon cancer ([place]) 2005  . Depression   . IC (interstitial cystitis)   . Migraines   . Pancreatitis     Patient Active Problem List   Diagnosis Date Noted  . Acute pancreatitis 08/22/2019  . Hair loss 08/18/2013  . 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 ADD (attention deficit disorder) 07/08/2012  . Asthma 05/27/2012  . Depression 05/27/2012  . Colon cancer ([place]) 05/27/2012  . Seasonal allergies 05/27/2012    Past Surgical History:  Procedure Laterality Date  . COLON SURGERY    . FOOT SURGERY    . TONSILLECTOMY AND ADENOIDECTOMY      OB History   No obstetric history on file.      Home Medications    Prior to Admission medications   Medication Sig Start Date End Date Taking? Authorizing Provider  AJOVY 225 MG/1.5ML SOSY Inject 225 mg into the skin every 30 (thirty) days. 08/07/19  Yes [provider]  estradiol-norethindrone (COMBIPATCH) 0.05-0.14 MG/DAY Place 1 patch onto the skin 2 (two) times a week.   Yes [provider]  Folic [id number] 3 Acid (MI-OMEGA PO) Take 1 capsule by mouth 2 (two) times daily.   Yes [provider]  gabapentin (NEURONTIN) 100 MG capsule Take 100 mg by mouth at bedtime.   Yes [provider]  Meth-Hyo-M  Bl-Na Phos-Ph Sal (URIBEL) 118 MG CAPS Take 118 mg by mouth daily as needed (bladder spasms).  02/22/16  Yes [provider]  Multiple Vitamin (MULTIVITAMIN) tablet Take 1 tablet by mouth daily.   Yes [provider]  nortriptyline (PAMELOR) 10 MG capsule TAKE 1 CAPSULE (10 MG TOTAL) BY MOUTH AT BEDTIME. Patient taking differently: Take 10 mg by mouth at bedtime. (Takes with 25mg  capsule for a total of 35mg ) 06/23/14  Yes Jaffe, Adam R, DO  nortriptyline (PAMELOR) 25 MG capsule Take 25 mg by mouth at bedtime. (Take with 10mg  capsule for a total of 35mg ) 03/24/18  Yes [provider]  sertraline (ZOLOFT) 25 MG tablet Take 50 mg by mouth daily.  04/12/18  Yes [provider]  acetaminophen (TYLENOL) 325 MG tablet Take 650 mg by mouth every 6 (six) hours as needed for mild pain or headache.    [provider]  azelastine (ASTELIN) 0.1 % nasal spray Place 2 sprays into both nostrils 2 (two) times daily as needed for rhinitis. Use in each nostril as directed    [provider]  fexofenadine (ALLEGRA) 60 MG tablet Take 60 mg by mouth 2 (two) times daily.    [provider]  levalbuterol Penne Lash HFA) 45 MCG/ACT inhaler Inhale 2 puffs into the lungs every 8 (eight) hours as needed for wheezing. 08/13/13   Roma Schanz  R, DO    Family History Family History  Problem Relation Age of Onset  . Arthritis Mother   . Heart disease Mother        Lorain Childes in the heart  . Hypertension Mother   . Cancer Paternal Aunt        brain  . Cancer Paternal Grandfather        skin    Social History Social History   Tobacco Use  . Smoking status: Never Smoker  . Smokeless tobacco: Never Used  Substance Use Topics  . Alcohol use: Not Currently    Comment: Occ  . Drug use: No     Allergies   Ciprofloxacin, Sulfa antibiotics, Imitrex [sumatriptan], and Iodinated diagnostic agents   Review of Systems Review of Systems Per HPI  Physical  Exam Triage Vital Signs ED Triage Vitals  Enc Vitals Group     BP 11/10/20 1811 113/79     Pulse Rate 11/10/20 1811 88     Resp 11/10/20 1811 20     Temp 11/10/20 1811 97.9 F (36.6 C)     Temp Source 11/10/20 1811 Oral     SpO2 11/10/20 1811 96 %     Weight --      Height --      Head Circumference --      Peak Flow --      Pain Score 11/10/20 1826 8     Pain Loc --      Pain Edu? --      Excl. in [place]? --    No data found.  Updated Vital Signs BP 113/79 (BP Location: Left Arm)   Pulse 88   Temp 97.9 F (36.6 C) (Oral)   Resp 20   SpO2 96%   Visual Acuity Right Eye Distance:   Left Eye Distance:   Bilateral Distance:    Right Eye Near:   Left Eye Near:    Bilateral Near:     Physical Exam Vitals and nursing note reviewed.  Constitutional:      Appearance: Normal appearance. She is not ill-appearing.  HENT:     Head: Atraumatic.     Mouth/Throat:     Mouth: Mucous membranes are moist.     Pharynx: Oropharynx is clear.  Eyes:     Extraocular Movements: Extraocular movements intact.     Conjunctiva/sclera: Conjunctivae normal.  Cardiovascular:     Rate and Rhythm: Normal rate and regular rhythm.     Heart sounds: Normal heart sounds.  Pulmonary:     Effort: Pulmonary effort is normal.     Breath sounds: Normal breath sounds.  Musculoskeletal:        General: Normal range of motion.     Cervical back: Normal range of motion and neck supple.  Skin:    General: Skin is warm.     Comments: 2 cm laceration down left thumb, wound edges well approximated, intermittent bleeding with movement of thumb  Neurological:     Mental Status: She is alert and oriented to person, place, and time.  Psychiatric:        Thought Content: Thought content normal.        Judgment: Judgment normal.     Comments: Mildly anxious      UC Treatments / Results  Labs (all labs ordered are listed, but only abnormal results are displayed) Labs Reviewed - No data to  display  EKG   Radiology No results found.  Procedures Laceration Repair  Date/Time: 11/10/2020 6:48 AM Performed by: Volney American, PA-C Authorized by: Volney American, PA-C   Consent:    Consent obtained:  Verbal   Consent given by:  Patient   Risks discussed:  Infection, pain, poor cosmetic result and poor wound healing   Alternatives discussed:  Observation Universal protocol:    Procedure explained and questions answered to patient or proxy's satisfaction: yes     Relevant documents present and verified: yes     Site/side marked: yes     Immediately prior to procedure, a time out was called: yes     Patient identity confirmed:  Verbally with patient and arm band Anesthesia:    Anesthesia method:  Local infiltration   Local anesthetic:  Lidocaine 2% w/o epi Laceration details:    Location:  Finger   Finger location:  L thumb   Length (cm):  2   Depth (mm):  2 Pre-procedure details:    Preparation:  Patient was prepped and draped in usual sterile fashion Exploration:    Limited defect created (wound extended): no     Hemostasis achieved with:  Direct pressure   Imaging outcome: foreign body not noted     Wound exploration: wound explored through full range of motion and entire depth of wound visualized     Contaminated: no   Treatment:    Area cleansed with:  Saline   Amount of cleaning:  Standard   Irrigation solution:  Sterile saline   Irrigation method:  Pressure wash   Visualized foreign bodies/material removed: no     Debridement:  None   Undermining:  None   Scar revision: no     Layers repaired: subcutaneous. Skin repair:    Repair method:  Sutures   Suture size:  5-0   Suture material:  Prolene   Suture technique:  Simple interrupted   Number of sutures:  4 Approximation:    Approximation:  Close Repair type:    Repair type:  Simple Post-procedure details:    Dressing:  Bulky dressing   Procedure completion:  Tolerated well, no  immediate complications   (including critical care time)  Medications Ordered in UC Medications - No data to display  Initial Impression / Assessment and Plan / UC Course  I have reviewed the triage vital signs and the nursing notes.  Pertinent labs & imaging results that were available during my care of the patient were reviewed by me and considered in my medical decision making (see chart for details).     Laceration repaired with 4 simple interrupted sutures. This was well-tolerated with no immediate complications noted. Wound dressed with pressure dressing, home wound care reviewed. Tdap up-to-date. Pain control discussed with ice off-and-on, elevation of arm, over-the-counter pain relievers. Return in 7 to 10 days for suture removal. Discussed strict return precautions for signs of infection which was discussed at length.  Final Clinical Impressions(s) / UC Diagnoses   Final diagnoses:  Laceration of left thumb without foreign body without damage to nail, initial encounter     Discharge Instructions     Follow-up in 7 to 10 days for wound recheck and possible suture removal    ED Prescriptions    None     PDMP not reviewed this encounter.   Merrie Roof [place], [place] 11/14/20 [phone]

## 2020-11-15 DIAGNOSIS — M1811 Unilateral primary osteoarthritis of first carpometacarpal joint, right hand: Secondary | ICD-10-CM | POA: Diagnosis not present

## 2020-11-15 DIAGNOSIS — M1812 Unilateral primary osteoarthritis of first carpometacarpal joint, left hand: Secondary | ICD-10-CM | POA: Diagnosis not present

## 2020-11-16 DIAGNOSIS — M25571 Pain in right ankle and joints of right foot: Secondary | ICD-10-CM | POA: Diagnosis not present

## 2020-11-18 DIAGNOSIS — Z85038 Personal history of other malignant neoplasm of large intestine: Secondary | ICD-10-CM | POA: Diagnosis not present

## 2020-11-18 DIAGNOSIS — Z933 Colostomy status: Secondary | ICD-10-CM | POA: Diagnosis not present

## 2020-12-06 DIAGNOSIS — Z79890 Hormone replacement therapy (postmenopausal): Secondary | ICD-10-CM | POA: Diagnosis not present

## 2020-12-06 DIAGNOSIS — Z130 Encounter for screening for diseases of the blood and blood-forming organs and certain disorders involving the immune mechanism: Secondary | ICD-10-CM | POA: Diagnosis not present

## 2020-12-06 DIAGNOSIS — L989 Disorder of the skin and subcutaneous tissue, unspecified: Secondary | ICD-10-CM | POA: Diagnosis not present

## 2020-12-06 DIAGNOSIS — Z6823 Body mass index (BMI) 23.0-23.9, adult: Secondary | ICD-10-CM | POA: Diagnosis not present

## 2020-12-06 DIAGNOSIS — Z1231 Encounter for screening mammogram for malignant neoplasm of breast: Secondary | ICD-10-CM | POA: Diagnosis not present

## 2020-12-06 DIAGNOSIS — Z01419 Encounter for gynecological examination (general) (routine) without abnormal findings: Secondary | ICD-10-CM | POA: Diagnosis not present

## 2021-01-02 ENCOUNTER — Ambulatory Visit
Admission: EM | Admit: 2021-01-02 | Discharge: 2021-01-02 | Disposition: A | Discharge status: Home / Self Care | Payer: BC Managed Care – PPO | Attending: Emergency Medicine | Admitting: Emergency Medicine

## 2021-01-02 ENCOUNTER — Encounter: Payer: Self-pay | Admitting: Emergency Medicine

## 2021-01-02 ENCOUNTER — Other Ambulatory Visit: Payer: Self-pay

## 2021-01-02 DIAGNOSIS — J301 Allergic rhinitis due to pollen: Secondary | ICD-10-CM

## 2021-01-02 MED ORDER — PREDNISONE 20 MG PO TABS: 40.0000 mg | ORAL_TABLET | Freq: Every day | ORAL | 0 refills | Status: AC

## 2021-01-02 MED ORDER — AFRIN NASAL SPRAY 0.05 % NA SOLN: 1.0000 | Freq: Two times a day (BID) | NASAL | 0 refills | Status: DC

## 2021-01-02 NOTE — Discharge Instructions (Signed)
Begin prednisone 40 mg daily for the next 5 days-take with food and earlier in the day if possible May continue Zyrtec Use Afrin twice daily x3 days Drink plenty of fluids Follow-up if not improving or worsening

## 2021-01-02 NOTE — ED Triage Notes (Signed)
Pt presents with sinus pain/ pressure, headache, and congestion xs 3 days. States has taken multiple OTC sinus/ allergy medications with no relief.

## 2021-01-02 NOTE — ED Provider Notes (Signed)
[name]    CSN: [id number] Arrival date & time: 01/02/21  0847      History   Chief Complaint Chief Complaint  Patient presents with  . Facial Pain  . Headache  . Nasal Congestion    HPI Zoe Briggs is a 45 y.o. female presenting today for evaluation of URI symptoms.  Reports associated sinus congestion pain pressure and headaches.  Symptoms began approximately 3 days ago.  Using over-the-counter sinus and allergy medicines without relief.  Feels like symptoms related to pollen.  Has also had nosebleeds mainly out of right nostril.  HPI  Past Medical History:  Diagnosis Date  . Allergy   . Asthma    Seasonal  . Colon cancer ([place]) 2005  . Depression   . IC (interstitial cystitis)   . Migraines   . Pancreatitis     Patient Active Problem List   Diagnosis Date Noted  . Acute pancreatitis 08/22/2019  . Hair loss 08/18/2013  . 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 ADD (attention deficit disorder) 07/08/2012  . Asthma 05/27/2012  . Depression 05/27/2012  . Colon cancer ([place]) 05/27/2012  . Seasonal allergies 05/27/2012    Past Surgical History:  Procedure Laterality Date  . COLON SURGERY    . FOOT SURGERY    . TONSILLECTOMY AND ADENOIDECTOMY      OB History   No obstetric history on file.      Home Medications    Prior to Admission medications   Medication Sig Start Date End Date Taking? Authorizing Provider  oxymetazoline (AFRIN NASAL SPRAY) 0.05 % nasal spray Place 1 spray into both nostrils 2 (two) times daily. Do not use for more than 3 days at a time 01/02/21  Yes Myeshia Fojtik C, PA-C  predniSONE (DELTASONE) 20 MG tablet Take 2 tablets (40 mg total) by mouth daily with breakfast for 5 days. 01/02/21 01/07/21 Yes Cam Dauphin C, PA-C  acetaminophen (TYLENOL) 325 MG tablet Take 650 mg by mouth every 6 (six) hours as needed for mild pain or headache.    [provider]  AJOVY 225 MG/1.5ML SOSY Inject 225 mg into the skin every 30  (thirty) days. 08/07/19   [provider]  azelastine (ASTELIN) 0.1 % nasal spray Place 2 sprays into both nostrils 2 (two) times daily as needed for rhinitis. Use in each nostril as directed    [provider]  estradiol-norethindrone (COMBIPATCH) 0.05-0.14 MG/DAY Place 1 patch onto the skin 2 (two) times a week.    [provider]  fexofenadine (ALLEGRA) 60 MG tablet Take 60 mg by mouth 2 (two) times daily.    [provider]  Folic [id number] 3 Acid (MI-OMEGA PO) Take 1 capsule by mouth 2 (two) times daily.    [provider]  gabapentin (NEURONTIN) 100 MG capsule Take 100 mg by mouth at bedtime.    [provider]  levalbuterol Penne Lash HFA) 45 MCG/ACT inhaler Inhale 2 puffs into the lungs every 8 (eight) hours as needed for wheezing. 08/13/13   Ann Held, DO  Meth-Hyo-M Bl-Na Phos-Ph Sal (URIBEL) 118 MG CAPS Take 118 mg by mouth daily as needed (bladder spasms).  02/22/16   [provider]  Multiple Vitamin (MULTIVITAMIN) tablet Take 1 tablet by mouth daily.    [provider]  nortriptyline (PAMELOR) 10 MG capsule TAKE 1 CAPSULE (10 MG TOTAL) BY MOUTH AT BEDTIME. Patient taking differently: Take 10 mg by mouth at bedtime. (Takes with 25mg  capsule for a  total of 35mg ) 06/23/14   Pieter Partridge, DO  nortriptyline (PAMELOR) 25 MG capsule Take 25 mg by mouth at bedtime. (Take with 10mg  capsule for a total of 35mg ) 03/24/18   [provider]  sertraline (ZOLOFT) 25 MG tablet Take 50 mg by mouth daily.  04/12/18   [provider]    Family History Family History  Problem Relation Age of Onset  . Arthritis Mother   . Heart disease Mother        Lorain Childes in the heart  . Hypertension Mother   . Cancer Paternal Aunt        brain  . Cancer Paternal Grandfather        skin    Social History Social History   Tobacco Use  . Smoking status: Never Smoker  . Smokeless tobacco: Never Used   Substance Use Topics  . Alcohol use: Not Currently    Comment: Occ  . Drug use: No     Allergies   Ciprofloxacin, Sulfa antibiotics, Imitrex [sumatriptan], and Iodinated diagnostic agents   Review of Systems Review of Systems  Constitutional: Negative for activity change, appetite change, chills, fatigue and fever.  HENT: Positive for congestion, rhinorrhea, sinus pressure and sore throat. Negative for ear pain and trouble swallowing.   Eyes: Negative for discharge and redness.  Respiratory: Positive for cough. Negative for chest tightness and shortness of breath.   Cardiovascular: Negative for chest pain.  Gastrointestinal: Negative for abdominal pain, diarrhea, nausea and vomiting.  Musculoskeletal: Negative for myalgias.  Skin: Negative for rash.  Neurological: Negative for dizziness, light-headedness and headaches.     Physical Exam Triage Vital Signs ED Triage Vitals  Enc Vitals Group     BP 01/02/21 0946 114/80     Pulse Rate 01/02/21 0946 (!) 114     Resp 01/02/21 0946 17     Temp 01/02/21 0946 98.3 F (36.8 C)     Temp Source 01/02/21 0946 Oral     SpO2 01/02/21 0946 97 %     Weight --      Height --      Head Circumference --      Peak Flow --      Pain Score 01/02/21 0944 0     Pain Loc --      Pain Edu? --      Excl. in [place]? --    No data found.  Updated Vital Signs BP 114/80 (BP Location: Left Arm)   Pulse (!) 114   Temp 98.3 F (36.8 C) (Oral)   Resp 17   SpO2 97%   Visual Acuity Right Eye Distance:   Left Eye Distance:   Bilateral Distance:    Right Eye Near:   Left Eye Near:    Bilateral Near:     Physical Exam Vitals and nursing note reviewed.  Constitutional:      Appearance: She is well-developed.     Comments: No acute distress  HENT:     Head: Normocephalic and atraumatic.     Ears:     Comments: Bilateral ears without tenderness to palpation of external auricle, tragus and mastoid, EAC's without erythema or swelling, TM's  with good bony landmarks and cone of light. Non erythematous.     Nose: Nose normal.     Comments: Right nares with bright red blood noted anteriorly    Mouth/Throat:     Comments: Oral mucosa pink and moist, no tonsillar enlargement or exudate. Posterior pharynx patent  and nonerythematous, no uvula deviation or swelling. Normal phonation. Eyes:     Conjunctiva/sclera: Conjunctivae normal.  Cardiovascular:     Rate and Rhythm: Tachycardia present.  Pulmonary:     Effort: Pulmonary effort is normal. No respiratory distress.     Comments: Breathing comfortably at rest, CTABL, no wheezing, rales or other adventitious sounds auscultated Abdominal:     General: There is no distension.  Musculoskeletal:        General: Normal range of motion.     Cervical back: Neck supple.  Skin:    General: Skin is warm and dry.  Neurological:     Mental Status: She is alert and oriented to person, place, and time.      UC Treatments / Results  Labs (all labs ordered are listed, but only abnormal results are displayed) Labs Reviewed - No data to display  EKG   Radiology No results found.  Procedures Procedures (including critical care time)  Medications Ordered in UC Medications - No data to display  Initial Impression / Assessment and Plan / UC Course  I have reviewed the triage vital signs and the nursing notes.  Pertinent labs & imaging results that were available during my care of the patient were reviewed by me and considered in my medical decision making (see chart for details).     Treating for allergic rhinitis-adding in course of prednisone, Afrin for nosebleeds, continue daily antihistamines, drink plenty of fluids.  Discussed elevated heart rate, possible correlation to some dehydration versus use of OTC congestion medicine such as Sudafed.  Push fluids and monitor for improvement in heart rate as well.  Discussed strict return precautions. Patient verbalized understanding  and is agreeable with plan.  Final Clinical Impressions(s) / UC Diagnoses   Final diagnoses:  Allergic rhinitis due to pollen, unspecified seasonality     Discharge Instructions     Begin prednisone 40 mg daily for the next 5 days-take with food and earlier in the day if possible May continue Zyrtec Use Afrin twice daily x3 days Drink plenty of fluids Follow-up if not improving or worsening    ED Prescriptions    Medication Sig Dispense Auth. Provider   predniSONE (DELTASONE) 20 MG tablet Take 2 tablets (40 mg total) by mouth daily with breakfast for 5 days. 10 tablet Eunique Balik C, PA-C   oxymetazoline (AFRIN NASAL SPRAY) 0.05 % nasal spray Place 1 spray into both nostrils 2 (two) times daily. Do not use for more than 3 days at a time 30 mL Vivion Romano, Elwin C, PA-C     PDMP not reviewed this encounter.   Janith Lima, PA-C 01/02/21 1038

## 2021-01-26 DIAGNOSIS — Z85038 Personal history of other malignant neoplasm of large intestine: Secondary | ICD-10-CM | POA: Diagnosis not present

## 2021-01-26 DIAGNOSIS — Z933 Colostomy status: Secondary | ICD-10-CM | POA: Diagnosis not present

## 2021-02-01 DIAGNOSIS — G43109 Migraine with aura, not intractable, without status migrainosus: Secondary | ICD-10-CM | POA: Diagnosis not present

## 2021-02-15 DIAGNOSIS — Z85038 Personal history of other malignant neoplasm of large intestine: Secondary | ICD-10-CM | POA: Diagnosis not present

## 2021-02-15 DIAGNOSIS — Z933 Colostomy status: Secondary | ICD-10-CM | POA: Diagnosis not present

## 2021-02-17 DIAGNOSIS — F39 Unspecified mood [affective] disorder: Secondary | ICD-10-CM | POA: Diagnosis not present

## 2021-02-17 DIAGNOSIS — F419 Anxiety disorder, unspecified: Secondary | ICD-10-CM | POA: Diagnosis not present

## 2021-02-17 DIAGNOSIS — F909 Attention-deficit hyperactivity disorder, unspecified type: Secondary | ICD-10-CM | POA: Diagnosis not present

## 2021-02-20 ENCOUNTER — Ambulatory Visit: Payer: Self-pay | Admitting: Allergy and Immunology

## 2021-02-24 DIAGNOSIS — M25571 Pain in right ankle and joints of right foot: Secondary | ICD-10-CM | POA: Diagnosis not present

## 2021-03-03 ENCOUNTER — Other Ambulatory Visit: Payer: Self-pay

## 2021-03-03 ENCOUNTER — Encounter: Payer: Self-pay | Admitting: Neurology

## 2021-03-03 DIAGNOSIS — R202 Paresthesia of skin: Secondary | ICD-10-CM

## 2021-03-14 DIAGNOSIS — M9901 Segmental and somatic dysfunction of cervical region: Secondary | ICD-10-CM | POA: Diagnosis not present

## 2021-03-14 DIAGNOSIS — M5441 Lumbago with sciatica, right side: Secondary | ICD-10-CM | POA: Diagnosis not present

## 2021-03-14 DIAGNOSIS — M9905 Segmental and somatic dysfunction of pelvic region: Secondary | ICD-10-CM | POA: Diagnosis not present

## 2021-03-14 DIAGNOSIS — M9903 Segmental and somatic dysfunction of lumbar region: Secondary | ICD-10-CM | POA: Diagnosis not present

## 2021-03-17 DIAGNOSIS — F39 Unspecified mood [affective] disorder: Secondary | ICD-10-CM | POA: Diagnosis not present

## 2021-03-17 DIAGNOSIS — F909 Attention-deficit hyperactivity disorder, unspecified type: Secondary | ICD-10-CM | POA: Diagnosis not present

## 2021-03-17 DIAGNOSIS — F419 Anxiety disorder, unspecified: Secondary | ICD-10-CM | POA: Diagnosis not present

## 2021-04-12 ENCOUNTER — Encounter: Payer: BC Managed Care – PPO | Admitting: Neurology

## 2021-04-13 ENCOUNTER — Ambulatory Visit: Payer: Self-pay

## 2021-04-24 ENCOUNTER — Ambulatory Visit: Payer: Self-pay | Admitting: Allergy and Immunology

## 2021-04-27 DIAGNOSIS — M1811 Unilateral primary osteoarthritis of first carpometacarpal joint, right hand: Secondary | ICD-10-CM | POA: Diagnosis not present

## 2021-04-27 DIAGNOSIS — M1812 Unilateral primary osteoarthritis of first carpometacarpal joint, left hand: Secondary | ICD-10-CM | POA: Diagnosis not present

## 2021-04-27 DIAGNOSIS — M180 Bilateral primary osteoarthritis of first carpometacarpal joints: Secondary | ICD-10-CM | POA: Diagnosis not present

## 2021-05-02 ENCOUNTER — Other Ambulatory Visit (HOSPITAL_COMMUNITY): Payer: Self-pay | Admitting: Family

## 2021-05-02 DIAGNOSIS — Z4689 Encounter for fitting and adjustment of other specified devices: Secondary | ICD-10-CM

## 2021-05-04 ENCOUNTER — Ambulatory Visit: Payer: Self-pay

## 2021-05-06 DIAGNOSIS — Z20822 Contact with and (suspected) exposure to covid-19: Secondary | ICD-10-CM | POA: Diagnosis not present

## 2021-05-08 DIAGNOSIS — F419 Anxiety disorder, unspecified: Secondary | ICD-10-CM | POA: Diagnosis not present

## 2021-05-08 DIAGNOSIS — F909 Attention-deficit hyperactivity disorder, unspecified type: Secondary | ICD-10-CM | POA: Diagnosis not present

## 2021-05-08 DIAGNOSIS — F39 Unspecified mood [affective] disorder: Secondary | ICD-10-CM | POA: Diagnosis not present

## 2021-05-09 ENCOUNTER — Other Ambulatory Visit: Payer: Self-pay

## 2021-05-09 ENCOUNTER — Encounter: Payer: Self-pay | Admitting: Emergency Medicine

## 2021-05-09 ENCOUNTER — Ambulatory Visit
Admission: EM | Admit: 2021-05-09 | Discharge: 2021-05-09 | Disposition: A | Discharge status: Home / Self Care | Payer: BC Managed Care – PPO | Attending: Internal Medicine | Admitting: Internal Medicine

## 2021-05-09 DIAGNOSIS — N3010 Interstitial cystitis (chronic) without hematuria: Secondary | ICD-10-CM | POA: Diagnosis not present

## 2021-05-09 DIAGNOSIS — R0981 Nasal congestion: Secondary | ICD-10-CM | POA: Diagnosis not present

## 2021-05-09 LAB — POCT URINALYSIS DIP (MANUAL ENTRY)
Bilirubin, UA: NEGATIVE
Blood, UA: NEGATIVE
Glucose, UA: NEGATIVE mg/dL
Ketones, POC UA: NEGATIVE mg/dL
Leukocytes, UA: NEGATIVE
Nitrite, UA: NEGATIVE
Protein Ur, POC: NEGATIVE mg/dL
Spec Grav, UA: 1.02 (ref 1.010–1.025)
Urobilinogen, UA: 0.2 E.U./dL
pH, UA: 7 (ref 5.0–8.0)

## 2021-05-09 MED ORDER — PREDNISONE 20 MG PO TABS: 20.0000 mg | ORAL_TABLET | Freq: Every day | ORAL | 0 refills | Status: DC

## 2021-05-09 NOTE — ED Provider Notes (Signed)
Roderic Palau    CSN: [id number] Arrival date & time: 05/09/21  1551      History   Chief Complaint Chief Complaint  Patient presents with   Urinary Tract Infection    HPI Zoe Briggs is a 45 y.o. female presents with 2 problems 1- dysuria and frequency x 2 days. She took medication for her IC that turns her urine blue. She could not get into her urologoist this week.   2- has been having nose congestion, postnasal drainage and mild cough x  3   days. Has been feeling pressure behind her eyes. Did a home covid test at home 3 days ago and was neg. She also had PCT Covid test Sunday, and was negative yesterday. She has not done saline rinses and she cant tolerate Flonase.     Past Medical History:  Diagnosis Date   Allergy    Asthma    Seasonal   Colon cancer ([place]) 2005   Depression    IC (interstitial cystitis)    Migraines    Pancreatitis     Patient Active Problem List   Diagnosis Date Noted   Acute pancreatitis 08/22/2019   Hair loss 08/18/2013   Recurrent UTI 11/04/2012   ADD (attention deficit disorder) 07/08/2012   Asthma 05/27/2012   Depression 05/27/2012   Colon cancer ([place]) 05/27/2012   Seasonal allergies 05/27/2012    Past Surgical History:  Procedure Laterality Date   COLON SURGERY     FOOT SURGERY     TONSILLECTOMY AND ADENOIDECTOMY      OB History   No obstetric history on file.      Home Medications    Prior to Admission medications   Medication Sig Start Date End Date Taking? Authorizing Provider  AJOVY 225 MG/1.5ML SOSY Inject 225 mg into the skin every 30 (thirty) days. 08/07/19  Yes [provider]  estradiol-norethindrone (COMBIPATCH) 0.05-0.14 MG/DAY Place 1 patch onto the skin 2 (two) times a week.   Yes [provider]  gabapentin (NEURONTIN) 100 MG capsule Take 100 mg by mouth at bedtime.   Yes [provider]  nortriptyline (PAMELOR) 10 MG capsule TAKE 1 CAPSULE (10 MG TOTAL) BY MOUTH AT  BEDTIME. Patient taking differently: Take 10 mg by mouth at bedtime. (Takes with '25mg'$  capsule for a total of '35mg'$ ) 06/23/14  Yes Jaffe, Adam R, DO  nortriptyline (PAMELOR) 25 MG capsule Take 25 mg by mouth at bedtime. (Take with '10mg'$  capsule for a total of '35mg'$ ) 03/24/18  Yes [provider]  predniSONE (DELTASONE) 20 MG tablet Take 1 tablet (20 mg total) by mouth daily with breakfast. 05/09/21  Yes Rodriguez-Southworth, Sunday Spillers, PA-C  sertraline (ZOLOFT) 25 MG tablet Take 50 mg by mouth daily.  04/12/18  Yes [provider]  acetaminophen (TYLENOL) 325 MG tablet Take 650 mg by mouth every 6 (six) hours as needed for mild pain or headache.    [provider]  azelastine (ASTELIN) 0.1 % nasal spray Place 2 sprays into both nostrils 2 (two) times daily as needed for rhinitis. Use in each nostril as directed    [provider]  fexofenadine (ALLEGRA) 60 MG tablet Take 60 mg by mouth 2 (two) times daily.    [provider]  Folic 123456 3 Acid (MI-OMEGA PO) Take 1 capsule by mouth 2 (two) times daily.    [provider]  levalbuterol Penne Lash HFA) 45 MCG/ACT inhaler Inhale 2 puffs into the lungs every 8 (eight) hours as  needed for wheezing. 08/13/13   Ann Held, DO  Meth-Hyo-M Bl-Na Phos-Ph Sal (URIBEL) 118 MG CAPS Take 118 mg by mouth daily as needed (bladder spasms).  02/22/16   [provider]  Multiple Vitamin (MULTIVITAMIN) tablet Take 1 tablet by mouth daily.    [provider]  oxymetazoline (AFRIN NASAL SPRAY) 0.05 % nasal spray Place 1 spray into both nostrils 2 (two) times daily. Do not use for more than 3 days at a time 01/02/21   Janith Lima, PA-C    Family History Family History  Problem Relation Age of Onset   Arthritis Mother    Heart disease Mother        Lorain Childes in the heart   Hypertension Mother    Cancer Paternal Aunt        brain   Cancer Paternal Grandfather        skin    Social  History Social History   Tobacco Use   Smoking status: Never   Smokeless tobacco: Never  Vaping Use   Vaping Use: Never used  Substance Use Topics   Alcohol use: Not Currently    Comment: Occ   Drug use: No     Allergies   Ciprofloxacin, Sulfa antibiotics, Imitrex [sumatriptan], and Iodinated diagnostic agents   Review of Systems Review of Systems  Constitutional:  Positive for chills. Negative for appetite change, diaphoresis and fever.  HENT:  Positive for congestion, postnasal drip, sinus pressure and sinus pain. Negative for ear discharge, ear pain, sore throat and trouble swallowing.   Eyes:  Negative for discharge.  Respiratory:  Positive for cough.   Gastrointestinal:  Negative for abdominal pain.  Genitourinary:  Positive for dysuria and frequency. Negative for flank pain and urgency.  Musculoskeletal:  Negative for gait problem.  Skin:  Negative for rash.  Neurological:  Negative for headaches.  Hematological:  Negative for adenopathy.    Physical Exam Triage Vital Signs ED Triage Vitals  Enc Vitals Group     BP 05/09/21 1642 123/84     Pulse Rate 05/09/21 1642 (!) 108     Resp 05/09/21 1642 18     Temp 05/09/21 1642 99.4 F (37.4 C)     Temp Source 05/09/21 1642 Oral     SpO2 05/09/21 1642 96 %     Weight --      Height --      Head Circumference --      Peak Flow --      Pain Score 05/09/21 1637 7     Pain Loc --      Pain Edu? --      Excl. in [place]? --    No data found.  Updated Vital Signs BP 123/84 (BP Location: Right Arm)   Pulse (!) 108   Temp 99.4 F (37.4 C) (Oral)   Resp 18   SpO2 96%   Visual Acuity Right Eye Distance:   Left Eye Distance:   Bilateral Distance:    Right Eye Near:   Left Eye Near:    Bilateral Near:      Physical Exam Vitals signs and nursing note reviewed.  Constitutional:      General: She is not in acute distress.    Appearance: Normal appearance. She is not ill-appearing, toxic-appearing or diaphoretic.   HENT:     Head: Normocephalic.     Right Ear: Tympanic membrane, ear canal and external ear normal.     Left Ear: Tympanic  membrane, ear canal and external ear normal.     Nose: with moderate congestion and clear mucous. All her sinuses are tender    Mouth/Throat: clear    Mouth: Mucous membranes are moist.  Eyes:     General: No scleral icterus.       Right eye: No discharge.        Left eye: No discharge.     Conjunctiva/sclera: Conjunctivae normal.  Neck:     Musculoskeletal: Neck supple. No neck rigidity.  Cardiovascular:     Rate and Rhythm: Normal rate and regular rhythm.     Heart sounds: No murmur.  Pulmonary:     Effort: Pulmonary effort is normal.     Breath sounds: Normal breath sounds.   Musculoskeletal: Normal range of motion.  Lymphadenopathy:     Cervical: No cervical adenopathy.  Skin:    General: Skin is warm and dry.     Coloration: Skin is not jaundiced.     Findings: No rash.  Neurological:     Mental Status: She is alert and oriented to person, place, and time.     Gait: Gait normal.  Psychiatric:        Mood and Affect: Mood normal.        Behavior: Behavior normal.        Thought Content: Thought content normal.        Judgment: Judgment normal.    UC Treatments / Results  Labs (all labs ordered are listed, but only abnormal results are displayed) Labs Reviewed  POCT URINALYSIS DIP (MANUAL ENTRY) - Abnormal; Notable for the following components:      Result Value   Color, UA green (*)    Clarity, UA cloudy (*)    All other components within normal limits    EKG   Radiology No results found.  Procedures Procedures (including critical care time)  Medications Ordered in UC Medications - No data to display  Initial Impression / Assessment and Plan / UC Course  I have reviewed the triage vital signs and the nursing notes. Pertinent labs results that were available during my care of the patient were reviewed by me and considered in my  medical decision making (see chart for details). Has nose congestion of flair of her IC since her urine dip is neg. She will try prednisone for a few days and saline nose rinses for her congestion. Will her her urologist to get bladder treatment she normally gets when the UA's are neg.     Final Clinical Impressions(s) / UC Diagnoses   Final diagnoses:  Nose congestion  Interstitial cystitis     Discharge Instructions      Do saline rinses twice a day for 5-7 days to help the nose congestion It is OK to continue the Sudafed as needed Try the Prednisone for the next 3 days to help with sinus inflammation and congestion.      ED Prescriptions     Medication Sig Dispense Auth. Provider   predniSONE (DELTASONE) 20 MG tablet Take 1 tablet (20 mg total) by mouth daily with breakfast. 5 tablet Rodriguez-Southworth, Sunday Spillers, PA-C      PDMP not reviewed this encounter.   Shelby Mattocks, PA-C 05/09/21 1739

## 2021-05-09 NOTE — Discharge Instructions (Addendum)
Do saline rinses twice a day for 5-7 days to help the nose congestion It is OK to continue the Sudafed as needed Try the Prednisone for the next 3 days to help with sinus inflammation and congestion.

## 2021-05-09 NOTE — ED Triage Notes (Signed)
Patient is concerned for uti.  Burning with urination, frequent urination  Patient also has concerns for a sinus infection.  Saturday took a home covid test, it was negative.  Patient behind eyes, drainage into throat, and minimal cough

## 2021-05-16 ENCOUNTER — Encounter: Payer: BC Managed Care – PPO | Admitting: Neurology

## 2021-05-22 DIAGNOSIS — Z85038 Personal history of other malignant neoplasm of large intestine: Secondary | ICD-10-CM | POA: Diagnosis not present

## 2021-05-22 DIAGNOSIS — Z933 Colostomy status: Secondary | ICD-10-CM | POA: Diagnosis not present

## 2021-05-23 DIAGNOSIS — R8271 Bacteriuria: Secondary | ICD-10-CM | POA: Diagnosis not present

## 2021-05-23 DIAGNOSIS — N393 Stress incontinence (female) (male): Secondary | ICD-10-CM | POA: Diagnosis not present

## 2021-05-23 DIAGNOSIS — R102 Pelvic and perineal pain: Secondary | ICD-10-CM | POA: Diagnosis not present

## 2021-05-23 DIAGNOSIS — N3010 Interstitial cystitis (chronic) without hematuria: Secondary | ICD-10-CM | POA: Diagnosis not present

## 2021-05-30 DIAGNOSIS — Z20822 Contact with and (suspected) exposure to covid-19: Secondary | ICD-10-CM | POA: Diagnosis not present

## 2021-06-06 ENCOUNTER — Other Ambulatory Visit: Payer: Self-pay

## 2021-06-06 ENCOUNTER — Ambulatory Visit (HOSPITAL_COMMUNITY)
Admission: RE | Admit: 2021-06-06 | Discharge: 2021-06-06 | Disposition: A | Discharge status: Home / Self Care | Payer: BC Managed Care – PPO | Source: Ambulatory Visit | Attending: Family | Admitting: Family

## 2021-06-06 DIAGNOSIS — Z0389 Encounter for observation for other suspected diseases and conditions ruled out: Secondary | ICD-10-CM | POA: Diagnosis not present

## 2021-06-06 DIAGNOSIS — Z4689 Encounter for fitting and adjustment of other specified devices: Secondary | ICD-10-CM | POA: Insufficient documentation

## 2021-06-12 DIAGNOSIS — M79644 Pain in right finger(s): Secondary | ICD-10-CM | POA: Diagnosis not present

## 2021-06-15 DIAGNOSIS — Z933 Colostomy status: Secondary | ICD-10-CM | POA: Diagnosis not present

## 2021-06-15 DIAGNOSIS — Z85038 Personal history of other malignant neoplasm of large intestine: Secondary | ICD-10-CM | POA: Diagnosis not present

## 2021-06-23 DIAGNOSIS — M25571 Pain in right ankle and joints of right foot: Secondary | ICD-10-CM | POA: Diagnosis not present

## 2021-06-26 DIAGNOSIS — M25571 Pain in right ankle and joints of right foot: Secondary | ICD-10-CM | POA: Diagnosis not present

## 2021-06-28 ENCOUNTER — Ambulatory Visit: Payer: BC Managed Care – PPO | Admitting: Allergy and Immunology

## 2021-06-29 DIAGNOSIS — M180 Bilateral primary osteoarthritis of first carpometacarpal joints: Secondary | ICD-10-CM | POA: Diagnosis not present

## 2021-06-29 DIAGNOSIS — M778 Other enthesopathies, not elsewhere classified: Secondary | ICD-10-CM | POA: Diagnosis not present

## 2021-06-29 DIAGNOSIS — M1811 Unilateral primary osteoarthritis of first carpometacarpal joint, right hand: Secondary | ICD-10-CM | POA: Diagnosis not present

## 2021-06-29 DIAGNOSIS — M25571 Pain in right ankle and joints of right foot: Secondary | ICD-10-CM | POA: Diagnosis not present

## 2021-06-30 DIAGNOSIS — F909 Attention-deficit hyperactivity disorder, unspecified type: Secondary | ICD-10-CM | POA: Diagnosis not present

## 2021-06-30 DIAGNOSIS — F419 Anxiety disorder, unspecified: Secondary | ICD-10-CM | POA: Diagnosis not present

## 2021-06-30 DIAGNOSIS — F39 Unspecified mood [affective] disorder: Secondary | ICD-10-CM | POA: Diagnosis not present

## 2021-07-04 DIAGNOSIS — K861 Other chronic pancreatitis: Secondary | ICD-10-CM | POA: Diagnosis not present

## 2021-07-04 DIAGNOSIS — B3731 Acute candidiasis of vulva and vagina: Secondary | ICD-10-CM | POA: Diagnosis not present

## 2021-07-04 DIAGNOSIS — G620 Drug-induced polyneuropathy: Secondary | ICD-10-CM | POA: Insufficient documentation

## 2021-07-04 DIAGNOSIS — Z1329 Encounter for screening for other suspected endocrine disorder: Secondary | ICD-10-CM | POA: Diagnosis not present

## 2021-07-04 DIAGNOSIS — E28319 Asymptomatic premature menopause: Secondary | ICD-10-CM | POA: Diagnosis not present

## 2021-07-04 DIAGNOSIS — Z0000 Encounter for general adult medical examination without abnormal findings: Secondary | ICD-10-CM | POA: Diagnosis not present

## 2021-07-05 DIAGNOSIS — Z1329 Encounter for screening for other suspected endocrine disorder: Secondary | ICD-10-CM | POA: Diagnosis not present

## 2021-07-05 DIAGNOSIS — Z136 Encounter for screening for cardiovascular disorders: Secondary | ICD-10-CM | POA: Diagnosis not present

## 2021-07-10 DIAGNOSIS — D72819 Decreased white blood cell count, unspecified: Secondary | ICD-10-CM | POA: Diagnosis not present

## 2021-07-10 DIAGNOSIS — D72818 Other decreased white blood cell count: Secondary | ICD-10-CM | POA: Diagnosis not present

## 2021-07-20 DIAGNOSIS — F39 Unspecified mood [affective] disorder: Secondary | ICD-10-CM | POA: Diagnosis not present

## 2021-07-20 DIAGNOSIS — F419 Anxiety disorder, unspecified: Secondary | ICD-10-CM | POA: Diagnosis not present

## 2021-07-20 DIAGNOSIS — F909 Attention-deficit hyperactivity disorder, unspecified type: Secondary | ICD-10-CM | POA: Diagnosis not present

## 2021-07-26 DIAGNOSIS — M79651 Pain in right thigh: Secondary | ICD-10-CM | POA: Diagnosis not present

## 2021-08-01 DIAGNOSIS — M79651 Pain in right thigh: Secondary | ICD-10-CM | POA: Diagnosis not present

## 2021-08-08 DIAGNOSIS — M9903 Segmental and somatic dysfunction of lumbar region: Secondary | ICD-10-CM | POA: Diagnosis not present

## 2021-08-08 DIAGNOSIS — M79604 Pain in right leg: Secondary | ICD-10-CM | POA: Diagnosis not present

## 2021-08-08 DIAGNOSIS — M9905 Segmental and somatic dysfunction of pelvic region: Secondary | ICD-10-CM | POA: Diagnosis not present

## 2021-08-08 DIAGNOSIS — M6240 Contracture of muscle, unspecified site: Secondary | ICD-10-CM | POA: Diagnosis not present

## 2021-08-10 DIAGNOSIS — M180 Bilateral primary osteoarthritis of first carpometacarpal joints: Secondary | ICD-10-CM | POA: Diagnosis not present

## 2021-08-10 DIAGNOSIS — M1812 Unilateral primary osteoarthritis of first carpometacarpal joint, left hand: Secondary | ICD-10-CM | POA: Diagnosis not present

## 2021-08-10 DIAGNOSIS — M1811 Unilateral primary osteoarthritis of first carpometacarpal joint, right hand: Secondary | ICD-10-CM | POA: Diagnosis not present

## 2021-08-15 DIAGNOSIS — M79651 Pain in right thigh: Secondary | ICD-10-CM | POA: Diagnosis not present

## 2021-08-15 DIAGNOSIS — Z85038 Personal history of other malignant neoplasm of large intestine: Secondary | ICD-10-CM | POA: Diagnosis not present

## 2021-08-15 DIAGNOSIS — Z933 Colostomy status: Secondary | ICD-10-CM | POA: Diagnosis not present

## 2021-08-22 DIAGNOSIS — M79651 Pain in right thigh: Secondary | ICD-10-CM | POA: Diagnosis not present

## 2021-08-24 DIAGNOSIS — H04123 Dry eye syndrome of bilateral lacrimal glands: Secondary | ICD-10-CM | POA: Diagnosis not present

## 2021-08-25 DIAGNOSIS — F909 Attention-deficit hyperactivity disorder, unspecified type: Secondary | ICD-10-CM | POA: Diagnosis not present

## 2021-08-25 DIAGNOSIS — F39 Unspecified mood [affective] disorder: Secondary | ICD-10-CM | POA: Diagnosis not present

## 2021-08-25 DIAGNOSIS — F419 Anxiety disorder, unspecified: Secondary | ICD-10-CM | POA: Diagnosis not present

## 2021-08-29 DIAGNOSIS — Z933 Colostomy status: Secondary | ICD-10-CM | POA: Diagnosis not present

## 2021-08-29 DIAGNOSIS — Z85038 Personal history of other malignant neoplasm of large intestine: Secondary | ICD-10-CM | POA: Diagnosis not present

## 2021-09-05 DIAGNOSIS — M79651 Pain in right thigh: Secondary | ICD-10-CM | POA: Diagnosis not present

## 2021-09-15 DIAGNOSIS — M6240 Contracture of muscle, unspecified site: Secondary | ICD-10-CM | POA: Diagnosis not present

## 2021-09-15 DIAGNOSIS — M9903 Segmental and somatic dysfunction of lumbar region: Secondary | ICD-10-CM | POA: Diagnosis not present

## 2021-09-15 DIAGNOSIS — M9905 Segmental and somatic dysfunction of pelvic region: Secondary | ICD-10-CM | POA: Diagnosis not present

## 2021-09-15 DIAGNOSIS — M79604 Pain in right leg: Secondary | ICD-10-CM | POA: Diagnosis not present

## 2021-09-22 DIAGNOSIS — M5416 Radiculopathy, lumbar region: Secondary | ICD-10-CM | POA: Diagnosis not present

## 2021-09-26 DIAGNOSIS — M2550 Pain in unspecified joint: Secondary | ICD-10-CM | POA: Diagnosis not present

## 2021-09-26 DIAGNOSIS — Z939 Artificial opening status, unspecified: Secondary | ICD-10-CM | POA: Diagnosis not present

## 2021-09-26 DIAGNOSIS — M791 Myalgia, unspecified site: Secondary | ICD-10-CM | POA: Diagnosis not present

## 2021-09-26 DIAGNOSIS — H04123 Dry eye syndrome of bilateral lacrimal glands: Secondary | ICD-10-CM | POA: Diagnosis not present

## 2021-09-28 DIAGNOSIS — G5701 Lesion of sciatic nerve, right lower limb: Secondary | ICD-10-CM | POA: Diagnosis not present

## 2021-10-04 DIAGNOSIS — M545 Low back pain, unspecified: Secondary | ICD-10-CM | POA: Diagnosis not present

## 2021-10-09 DIAGNOSIS — M5416 Radiculopathy, lumbar region: Secondary | ICD-10-CM | POA: Diagnosis not present

## 2021-10-10 DIAGNOSIS — G5701 Lesion of sciatic nerve, right lower limb: Secondary | ICD-10-CM | POA: Diagnosis not present

## 2021-10-10 DIAGNOSIS — M79651 Pain in right thigh: Secondary | ICD-10-CM | POA: Diagnosis not present

## 2021-10-13 DIAGNOSIS — M6240 Contracture of muscle, unspecified site: Secondary | ICD-10-CM | POA: Diagnosis not present

## 2021-10-13 DIAGNOSIS — M9903 Segmental and somatic dysfunction of lumbar region: Secondary | ICD-10-CM | POA: Diagnosis not present

## 2021-10-13 DIAGNOSIS — M9905 Segmental and somatic dysfunction of pelvic region: Secondary | ICD-10-CM | POA: Diagnosis not present

## 2021-10-13 DIAGNOSIS — M79604 Pain in right leg: Secondary | ICD-10-CM | POA: Diagnosis not present

## 2021-10-19 DIAGNOSIS — M1811 Unilateral primary osteoarthritis of first carpometacarpal joint, right hand: Secondary | ICD-10-CM | POA: Diagnosis not present

## 2021-10-19 DIAGNOSIS — M180 Bilateral primary osteoarthritis of first carpometacarpal joints: Secondary | ICD-10-CM | POA: Diagnosis not present

## 2021-10-24 DIAGNOSIS — M79651 Pain in right thigh: Secondary | ICD-10-CM | POA: Diagnosis not present

## 2021-10-30 ENCOUNTER — Ambulatory Visit: Payer: BC Managed Care – PPO | Admitting: Allergy and Immunology

## 2021-10-30 ENCOUNTER — Encounter: Payer: Self-pay | Admitting: Allergy and Immunology

## 2021-10-30 ENCOUNTER — Other Ambulatory Visit: Payer: Self-pay

## 2021-10-30 VITALS — BP 132/72 | HR 80 | Resp 16 | Ht 70.0 in | Wt 160.0 lb

## 2021-10-30 DIAGNOSIS — J301 Allergic rhinitis due to pollen: Secondary | ICD-10-CM

## 2021-10-30 DIAGNOSIS — J4520 Mild intermittent asthma, uncomplicated: Secondary | ICD-10-CM | POA: Diagnosis not present

## 2021-10-30 DIAGNOSIS — J3089 Other allergic rhinitis: Secondary | ICD-10-CM

## 2021-10-30 MED ORDER — RYALTRIS 665-25 MCG/ACT NA SUSP
2.0000 | Freq: Two times a day (BID) | NASAL | 5 refills | Status: DC
Start: 2021-10-30 — End: 2022-05-24

## 2021-10-30 NOTE — Progress Notes (Signed)
[facility]   Dear Tobie Lords,  Thank you for referring Zoe Briggs to the [facility] on 10/30/2021.   Below is a summation of this patient's evaluation and recommendations.  Thank you for your referral. I will keep you informed about this patient's response to treatment.   If you have any questions please do not hesitate to contact me.   Sincerely,  Jiles Prows, MD Allergy / Immunology [facility]   ______________________________________________________________________    NEW PATIENT NOTE  Referring Provider: Gregor Hams, FNP Primary Provider: Jolinda Croak, MD Date of office visit: 10/30/2021    Subjective:   Chief Complaint:  Zoe Briggs (DOB: 1975-10-09) is a 46 y.o. female who presents to the clinic on 10/30/2021 with a chief complaint of Allergic Rhinitis  .     HPI: Zoe Briggs presents to this clinic in evaluation of allergies.  She has a long history of allergic rhinoconjunctivitis with nasal congestion and sneezing and rhinorrhea and itchy watery eyes occurring on a perennial basis with seasonal exacerbation for which she underwent a course of immunotherapy greater than 10 years ago while living in [place].  This immunotherapy helped her dramatically and she resolved her allergies.  Unfortunately, over the course of the past 2 years she has had a particularly bad season with her allergies especially during the spring.  Even though she uses an antihistamine and Sudafed and Flonase and eyedrops she has problems through the spring season.  Sometimes she will spillover into a sinus headache and then a migraine.  She would like to start a course of immunotherapy.  She also has a history of asthma which is very intermittent.  It is only with very cold air exposure or sometimes on very humid days that she will use a  short acting bronchodilator.  Her use of a short acting bronchodilator is less than 10 times per year.  She has not required a systemic steroid to treat an exacerbation of asthma in many years.  Apparently when eating wheat she will develop joint pain and headache and brain fog.  She has been wheat free for many years after receiving a blood test result that showed sensitivity to wheat.  Past Medical History:  Diagnosis Date   Allergy    Asthma    Seasonal   Colon cancer ([place]) 2005   Depression    IC (interstitial cystitis)    Migraines    Pancreatitis     Past Surgical History:  Procedure Laterality Date   COLON SURGERY     FOOT SURGERY     TONSILLECTOMY AND ADENOIDECTOMY      Allergies as of 10/30/2021       Reactions   Ciprofloxacin Nausea Only   Sulfa Antibiotics Other (See Comments)   abd pain   Imitrex [sumatriptan] Rash   Iodinated Contrast Media Rash   Only when given together with chemo        Medication List    acetaminophen 325 MG tablet Commonly known as: TYLENOL Take 650 mg by mouth every 6 (six) hours as needed for mild pain or headache.   Ajovy 225 MG/1.5ML Sosy Generic drug: Fremanezumab-vfrm Inject 225 mg into the skin every 30 (thirty) days.   baclofen 10 MG tablet Commonly known as: LIORESAL Take 10 mg by mouth every 8 (eight) hours as needed.   celecoxib 200 MG  capsule Commonly known as: CELEBREX Take 200 mg by mouth daily.   [employer] Omega-3 Gummy Fish/DHA 113.5 MG Chew Chew by mouth.   estradiol-norethindrone 0.05-0.14 MG/DAY Commonly known as: COMBIPATCH Place 1 patch onto the skin 2 (two) times a week.   fexofenadine 60 MG tablet Commonly known as: ALLEGRA Take 60 mg by mouth 2 (two) times daily.   hydrOXYzine 10 MG tablet Commonly known as: ATARAX Take 10-20 mg by mouth daily as needed.   levalbuterol 45 MCG/ACT inhaler Commonly known as: Xopenex HFA Inhale 2 puffs into the lungs every 8 (eight) hours as needed for wheezing.    methylphenidate 18 MG CR tablet Commonly known as: CONCERTA Take 18 mg by mouth every morning.   MI-OMEGA PO Take 1 capsule by mouth 2 (two) times daily.   multivitamin tablet Take 1 tablet by mouth daily.   nortriptyline 25 MG capsule Commonly known as: PAMELOR Take 25 mg by mouth at bedtime. (Take with 10mg  capsule for a total of 35mg )   sertraline 25 MG tablet Commonly known as: ZOLOFT Take 50 mg by mouth daily.    Review of systems negative except as noted in HPI / PMHx or noted below:  Review of Systems  Constitutional: Negative.   HENT: Negative.    Eyes: Negative.   Respiratory: Negative.    Cardiovascular: Negative.   Gastrointestinal: Negative.   Genitourinary: Negative.   Musculoskeletal: Negative.   Skin: Negative.   Neurological: Negative.   Endo/Heme/Allergies: Negative.   Psychiatric/Behavioral: Negative.     Family History  Problem Relation Age of Onset   Arthritis Mother    Heart disease Mother        Hole in the heart   Hypertension Mother    Cancer Paternal Aunt        brain   Cancer Paternal Grandfather        skin    Social History   Socioeconomic History   Marital status: Married    Spouse name: Not on file   Number of children: 1   Years of education: Not on file   Highest education level: Not on file  Occupational History   Occupation: [employer]   Tobacco Use   Smoking status: Never   Smokeless tobacco: Never  Vaping Use   Vaping Use: Never used  Substance and Sexual Activity   Alcohol use: Not Currently    Comment: Occ   Drug use: No   Sexual activity: Yes    Partners: Male    Birth control/protection: None  Other Topics Concern   Not on file  Social History Narrative   Exercise-- 4 days a week   Environmental and Social history  Lives in a house with a dry environment, cat and dog look inside the household, carpet in the bathroom, plastic on the bed, plastic on the pillow, and and smoke exposure with  inside the household.  She is a Education officer, museum.  Objective:   Vitals:   10/30/21 1401  BP: 132/72  Pulse: 80  Resp: 16  SpO2: 98%   Height: 5\' 10"  (177.8 cm) Weight: 160 lb (72.6 kg)  Physical Exam Constitutional:      Appearance: She is not diaphoretic.  HENT:     Head: Normocephalic.     Right Ear: Tympanic membrane, ear canal and external ear normal.     Left Ear: Tympanic membrane, ear canal and external ear normal.     Nose: Nose normal. No mucosal edema or rhinorrhea.  Mouth/Throat:     Pharynx: Uvula midline. No oropharyngeal exudate.  Eyes:     Conjunctiva/sclera: Conjunctivae normal.  Neck:     Thyroid: No thyromegaly.     Trachea: Trachea normal. No tracheal tenderness or tracheal deviation.  Cardiovascular:     Rate and Rhythm: Normal rate and regular rhythm.     Heart sounds: Normal heart sounds, S1 normal and S2 normal. No murmur heard. Pulmonary:     Effort: No respiratory distress.     Breath sounds: Normal breath sounds. No stridor. No wheezing or rales.  Lymphadenopathy:     Head:     Right side of head: No tonsillar adenopathy.     Left side of head: No tonsillar adenopathy.     Cervical: No cervical adenopathy.  Skin:    Findings: No erythema or rash.     Nails: There is no clubbing.  Neurological:     Mental Status: She is alert.    Diagnostics: Allergy skin tests were performed.  She did not demonstrate any hypersensitivity against a screening panel of aeroallergens or foods including wheat.  Assessment and Plan:    1. Perennial allergic rhinitis   2. Seasonal allergic rhinitis due to pollen   3. Asthma, mild intermittent, well-controlled     1.  Allergen avoidance measures - check area 2 aeroallergen profile  2.  Treat and prevent inflammation:  A. Ryaltris - 2 sprays each nostril 1-2 times per day (specialty pharmacy)  3.  If needed:  A. Xopenex HFA - 2 inhalations every 4-6 hours B. OTC antihistamine C. Pataday - 1 drop each  eye 1 time per day  4.  Consider a course of immunotherapy  5.  Return to clinic in 6 months or earlier if problem  Zoe Briggs appears to have some degree of inflammation affecting her airway and there may be an atopic trigger which we will further explore with an area two aero allergen IgE profile.  She will use a combination nasal steroid and nasal antihistamine and she has a selection of agents to be utilized should they be needed.  If we can demonstrate hypersensitivity against various aeroallergens she would definitely be a candidate for immunotherapy.  I will contact her with the results of her blood test once they are available for review.  Jiles Prows, MD Allergy / Immunology [facility]

## 2021-10-30 NOTE — Patient Instructions (Addendum)
  1.  Allergen avoidance measures - check area 2 aeroallergen profile  2.  Treat and prevent inflammation:  A. Ryaltris - 2 sprays each nostril 1-2 times per day (specialty pharmacy)  3.  If needed:  A. Xopenex HFA - 2 inhalations every 4-6 hours B. OTC antihistamine C. Pataday - 1 drop each eye 1 time per day  4.  Consider a course of immunotherapy  5.  Return to clinic in 6 months or earlier if problem

## 2021-10-31 ENCOUNTER — Encounter: Payer: Self-pay | Admitting: Allergy and Immunology

## 2021-10-31 DIAGNOSIS — J301 Allergic rhinitis due to pollen: Secondary | ICD-10-CM | POA: Diagnosis not present

## 2021-10-31 DIAGNOSIS — J3089 Other allergic rhinitis: Secondary | ICD-10-CM | POA: Diagnosis not present

## 2021-10-31 DIAGNOSIS — M79651 Pain in right thigh: Secondary | ICD-10-CM | POA: Diagnosis not present

## 2021-11-06 DIAGNOSIS — M79604 Pain in right leg: Secondary | ICD-10-CM | POA: Diagnosis not present

## 2021-11-06 LAB — ALLERGENS W/TOTAL IGE AREA 2

## 2021-11-07 ENCOUNTER — Telehealth: Payer: Self-pay | Admitting: Allergy and Immunology

## 2021-11-07 DIAGNOSIS — M79651 Pain in right thigh: Secondary | ICD-10-CM | POA: Diagnosis not present

## 2021-11-07 DIAGNOSIS — Z933 Colostomy status: Secondary | ICD-10-CM | POA: Diagnosis not present

## 2021-11-07 DIAGNOSIS — F411 Generalized anxiety disorder: Secondary | ICD-10-CM | POA: Diagnosis not present

## 2021-11-07 DIAGNOSIS — Z85038 Personal history of other malignant neoplasm of large intestine: Secondary | ICD-10-CM | POA: Diagnosis not present

## 2021-11-07 MED ORDER — AZITHROMYCIN 250 MG PO TABS: 250.0000 mg | ORAL_TABLET | Freq: Every day | ORAL | 0 refills | Status: AC

## 2021-11-07 MED ORDER — PREDNISONE 10 MG PO TABS
10.0000 mg | ORAL_TABLET | Freq: Every day | ORAL | 0 refills | Status: AC
Start: 2021-11-07 — End: 2021-11-17

## 2021-11-07 NOTE — Telephone Encounter (Signed)
Please inform Zoe Briggs that if she does develop a fungal infection she can contact me and we will get her the appropriate treatment for that issue.

## 2021-11-07 NOTE — Telephone Encounter (Signed)
Please inform Zoe Briggs that her skin test and blood test did not identify any allergy to common allergens located in the area.  There may be some allergens that are not particularly common to this area but are still causing her a problem.  But she may also have rolled over into a component of a prolonged episode of infection affecting her airway.  Lets have her use prednisone 10 mg a day for 10 days and azithromycin 250 mg tablet 1 time per day for 10 days.  If she has a low-grade infection with inflammation this plan should take care of this issue and hopefully she will do well with this approach.  She should continue on her other medications as prescribed.

## 2021-11-07 NOTE — Telephone Encounter (Signed)
Pt called to go over lab results, I read out to her Dr. Bruna Potter message.   Patient states she is miserable and is unsure as to why the tests say she does not have allergies. Patient states she was tested for allergies 12 years ago in [place].   Best contact number: [phone]

## 2021-11-07 NOTE — Telephone Encounter (Signed)
Called patient - DOB verified - per provider, patient was advised to contact the office if she begins developing antifungal symptoms after taking antibiotics - appropriate medication will be electronically sent in. Patient verbalized understanding.

## 2021-11-07 NOTE — Telephone Encounter (Signed)
Spoke with pt and informed her of Dr. Lyndon Code message. Meds sent to [facility] on [address].   Dr. Neldon Mc- she would also like an antifungal due to her commonly getting yeast infections with antibiotics. Please advise.

## 2021-11-08 DIAGNOSIS — Z85038 Personal history of other malignant neoplasm of large intestine: Secondary | ICD-10-CM | POA: Diagnosis not present

## 2021-11-08 DIAGNOSIS — Z933 Colostomy status: Secondary | ICD-10-CM | POA: Diagnosis not present

## 2021-11-20 ENCOUNTER — Telehealth: Payer: Self-pay

## 2021-11-20 NOTE — Telephone Encounter (Signed)
Patient called and would like prescription for prednisone for when her allergies get pretty bad. Patient states she would like to be prepared. Please advise.

## 2021-11-21 DIAGNOSIS — M79651 Pain in right thigh: Secondary | ICD-10-CM | POA: Diagnosis not present

## 2021-11-22 NOTE — Telephone Encounter (Signed)
Patient informed and verbalized understanding

## 2021-11-27 DIAGNOSIS — R102 Pelvic and perineal pain: Secondary | ICD-10-CM | POA: Diagnosis not present

## 2021-11-27 DIAGNOSIS — B9620 Unspecified Escherichia coli [E. coli] as the cause of diseases classified elsewhere: Secondary | ICD-10-CM | POA: Diagnosis not present

## 2021-11-27 DIAGNOSIS — N390 Urinary tract infection, site not specified: Secondary | ICD-10-CM | POA: Diagnosis not present

## 2021-11-27 DIAGNOSIS — N3010 Interstitial cystitis (chronic) without hematuria: Secondary | ICD-10-CM | POA: Diagnosis not present

## 2021-11-30 DIAGNOSIS — M79651 Pain in right thigh: Secondary | ICD-10-CM | POA: Diagnosis not present

## 2021-12-04 DIAGNOSIS — M79604 Pain in right leg: Secondary | ICD-10-CM | POA: Insufficient documentation

## 2021-12-04 DIAGNOSIS — G629 Polyneuropathy, unspecified: Secondary | ICD-10-CM | POA: Insufficient documentation

## 2021-12-06 DIAGNOSIS — F411 Generalized anxiety disorder: Secondary | ICD-10-CM | POA: Diagnosis not present

## 2021-12-11 DIAGNOSIS — G5771 Causalgia of right lower limb: Secondary | ICD-10-CM | POA: Diagnosis not present

## 2021-12-11 DIAGNOSIS — G620 Drug-induced polyneuropathy: Secondary | ICD-10-CM | POA: Diagnosis not present

## 2021-12-11 DIAGNOSIS — T451X5A Adverse effect of antineoplastic and immunosuppressive drugs, initial encounter: Secondary | ICD-10-CM | POA: Diagnosis not present

## 2021-12-12 DIAGNOSIS — B9620 Unspecified Escherichia coli [E. coli] as the cause of diseases classified elsewhere: Secondary | ICD-10-CM | POA: Diagnosis not present

## 2021-12-12 DIAGNOSIS — N3010 Interstitial cystitis (chronic) without hematuria: Secondary | ICD-10-CM | POA: Diagnosis not present

## 2021-12-12 DIAGNOSIS — M6289 Other specified disorders of muscle: Secondary | ICD-10-CM | POA: Diagnosis not present

## 2021-12-12 DIAGNOSIS — M6281 Muscle weakness (generalized): Secondary | ICD-10-CM | POA: Diagnosis not present

## 2021-12-12 DIAGNOSIS — N390 Urinary tract infection, site not specified: Secondary | ICD-10-CM | POA: Diagnosis not present

## 2021-12-12 DIAGNOSIS — M79651 Pain in right thigh: Secondary | ICD-10-CM | POA: Diagnosis not present

## 2021-12-12 DIAGNOSIS — M62838 Other muscle spasm: Secondary | ICD-10-CM | POA: Diagnosis not present

## 2021-12-25 DIAGNOSIS — M62838 Other muscle spasm: Secondary | ICD-10-CM | POA: Diagnosis not present

## 2021-12-25 DIAGNOSIS — M6281 Muscle weakness (generalized): Secondary | ICD-10-CM | POA: Diagnosis not present

## 2021-12-25 DIAGNOSIS — M6289 Other specified disorders of muscle: Secondary | ICD-10-CM | POA: Diagnosis not present

## 2021-12-25 DIAGNOSIS — R102 Pelvic and perineal pain: Secondary | ICD-10-CM | POA: Diagnosis not present

## 2021-12-28 DIAGNOSIS — G43109 Migraine with aura, not intractable, without status migrainosus: Secondary | ICD-10-CM | POA: Diagnosis not present

## 2021-12-28 DIAGNOSIS — G43009 Migraine without aura, not intractable, without status migrainosus: Secondary | ICD-10-CM | POA: Diagnosis not present

## 2022-01-02 DIAGNOSIS — R102 Pelvic and perineal pain: Secondary | ICD-10-CM | POA: Diagnosis not present

## 2022-01-02 DIAGNOSIS — R42 Dizziness and giddiness: Secondary | ICD-10-CM | POA: Diagnosis not present

## 2022-01-02 DIAGNOSIS — M6289 Other specified disorders of muscle: Secondary | ICD-10-CM | POA: Diagnosis not present

## 2022-01-02 DIAGNOSIS — M62838 Other muscle spasm: Secondary | ICD-10-CM | POA: Diagnosis not present

## 2022-01-02 DIAGNOSIS — M6281 Muscle weakness (generalized): Secondary | ICD-10-CM | POA: Diagnosis not present

## 2022-01-03 DIAGNOSIS — N390 Urinary tract infection, site not specified: Secondary | ICD-10-CM | POA: Diagnosis not present

## 2022-01-03 DIAGNOSIS — B9620 Unspecified Escherichia coli [E. coli] as the cause of diseases classified elsewhere: Secondary | ICD-10-CM | POA: Diagnosis not present

## 2022-01-03 DIAGNOSIS — N3020 Other chronic cystitis without hematuria: Secondary | ICD-10-CM | POA: Diagnosis not present

## 2022-01-03 DIAGNOSIS — Z87442 Personal history of urinary calculi: Secondary | ICD-10-CM | POA: Diagnosis not present

## 2022-01-04 DIAGNOSIS — Z85038 Personal history of other malignant neoplasm of large intestine: Secondary | ICD-10-CM | POA: Diagnosis not present

## 2022-01-04 DIAGNOSIS — Z79890 Hormone replacement therapy (postmenopausal): Secondary | ICD-10-CM | POA: Diagnosis not present

## 2022-01-04 DIAGNOSIS — Z130 Encounter for screening for diseases of the blood and blood-forming organs and certain disorders involving the immune mechanism: Secondary | ICD-10-CM | POA: Diagnosis not present

## 2022-01-04 DIAGNOSIS — F411 Generalized anxiety disorder: Secondary | ICD-10-CM | POA: Diagnosis not present

## 2022-01-04 DIAGNOSIS — Z1231 Encounter for screening mammogram for malignant neoplasm of breast: Secondary | ICD-10-CM | POA: Diagnosis not present

## 2022-01-04 DIAGNOSIS — Z01419 Encounter for gynecological examination (general) (routine) without abnormal findings: Secondary | ICD-10-CM | POA: Diagnosis not present

## 2022-01-08 DIAGNOSIS — R102 Pelvic and perineal pain: Secondary | ICD-10-CM | POA: Diagnosis not present

## 2022-01-08 DIAGNOSIS — M6289 Other specified disorders of muscle: Secondary | ICD-10-CM | POA: Diagnosis not present

## 2022-01-08 DIAGNOSIS — M62838 Other muscle spasm: Secondary | ICD-10-CM | POA: Diagnosis not present

## 2022-01-08 DIAGNOSIS — M6281 Muscle weakness (generalized): Secondary | ICD-10-CM | POA: Diagnosis not present

## 2022-01-11 DIAGNOSIS — Z85038 Personal history of other malignant neoplasm of large intestine: Secondary | ICD-10-CM | POA: Diagnosis not present

## 2022-01-11 DIAGNOSIS — Z933 Colostomy status: Secondary | ICD-10-CM | POA: Diagnosis not present

## 2022-01-18 DIAGNOSIS — M62838 Other muscle spasm: Secondary | ICD-10-CM | POA: Diagnosis not present

## 2022-01-18 DIAGNOSIS — M6281 Muscle weakness (generalized): Secondary | ICD-10-CM | POA: Diagnosis not present

## 2022-01-18 DIAGNOSIS — M6289 Other specified disorders of muscle: Secondary | ICD-10-CM | POA: Diagnosis not present

## 2022-01-18 DIAGNOSIS — R102 Pelvic and perineal pain: Secondary | ICD-10-CM | POA: Diagnosis not present

## 2022-01-22 DIAGNOSIS — R922 Inconclusive mammogram: Secondary | ICD-10-CM | POA: Diagnosis not present

## 2022-01-22 DIAGNOSIS — R928 Other abnormal and inconclusive findings on diagnostic imaging of breast: Secondary | ICD-10-CM | POA: Diagnosis not present

## 2022-01-26 DIAGNOSIS — R350 Frequency of micturition: Secondary | ICD-10-CM | POA: Diagnosis not present

## 2022-01-26 DIAGNOSIS — R8271 Bacteriuria: Secondary | ICD-10-CM | POA: Diagnosis not present

## 2022-01-26 DIAGNOSIS — N3020 Other chronic cystitis without hematuria: Secondary | ICD-10-CM | POA: Diagnosis not present

## 2022-01-26 DIAGNOSIS — R300 Dysuria: Secondary | ICD-10-CM | POA: Diagnosis not present

## 2022-01-30 ENCOUNTER — Ambulatory Visit: Payer: BC Managed Care – PPO | Admitting: Allergy and Immunology

## 2022-01-30 DIAGNOSIS — J309 Allergic rhinitis, unspecified: Secondary | ICD-10-CM

## 2022-02-01 DIAGNOSIS — Z933 Colostomy status: Secondary | ICD-10-CM | POA: Diagnosis not present

## 2022-02-01 DIAGNOSIS — Z85038 Personal history of other malignant neoplasm of large intestine: Secondary | ICD-10-CM | POA: Diagnosis not present

## 2022-02-07 DIAGNOSIS — F411 Generalized anxiety disorder: Secondary | ICD-10-CM | POA: Diagnosis not present

## 2022-02-08 DIAGNOSIS — N3281 Overactive bladder: Secondary | ICD-10-CM | POA: Diagnosis not present

## 2022-02-08 DIAGNOSIS — N390 Urinary tract infection, site not specified: Secondary | ICD-10-CM | POA: Diagnosis not present

## 2022-02-08 DIAGNOSIS — R102 Pelvic and perineal pain: Secondary | ICD-10-CM | POA: Diagnosis not present

## 2022-02-08 DIAGNOSIS — B9620 Unspecified Escherichia coli [E. coli] as the cause of diseases classified elsewhere: Secondary | ICD-10-CM | POA: Diagnosis not present

## 2022-02-08 DIAGNOSIS — N3010 Interstitial cystitis (chronic) without hematuria: Secondary | ICD-10-CM | POA: Diagnosis not present

## 2022-02-13 IMAGING — DX DG ABDOMEN 1V
2 series · 2 of 2 positions shown · non-contrast
Comparison: Abdominopelvic CT 08/12/2019

CLINICAL DATA: Evaluate for presence of pancreatic duct stent.

EXAM:
ABDOMEN - 1 VIEW

[abdomen kub (1 of 2)]
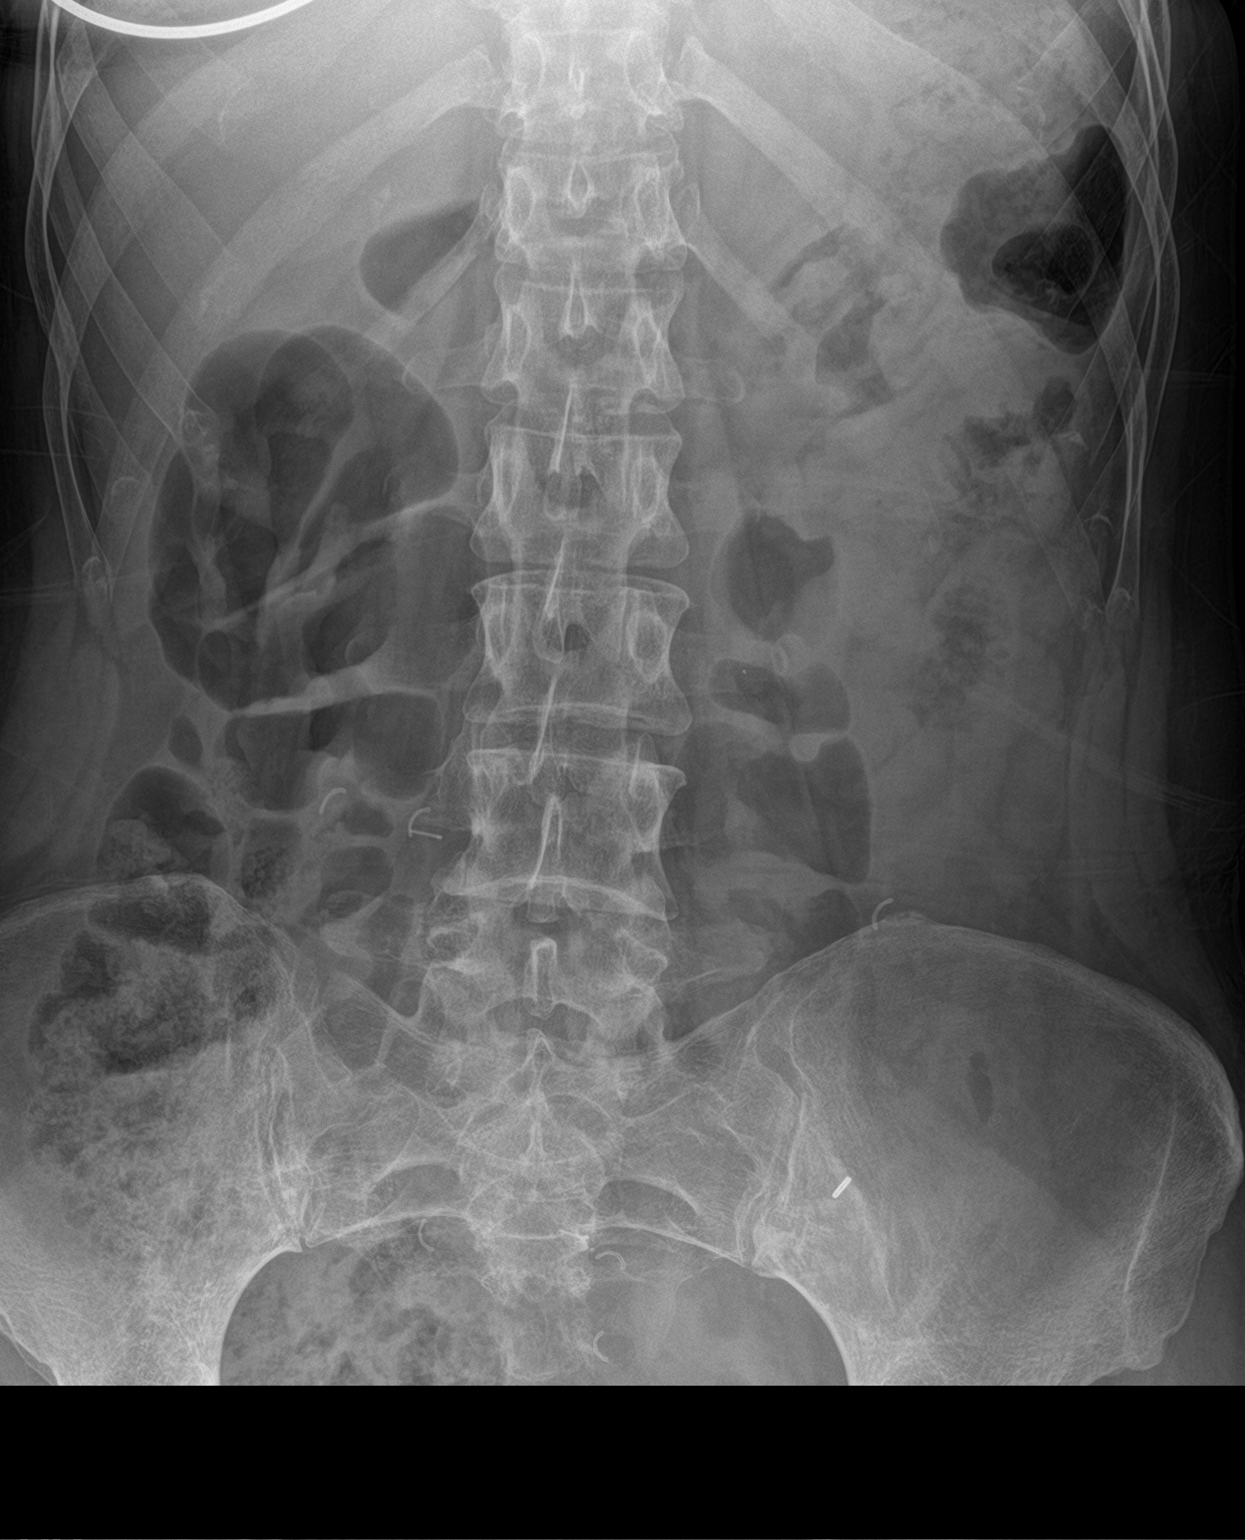

[abdomen kub (2 of 2)]
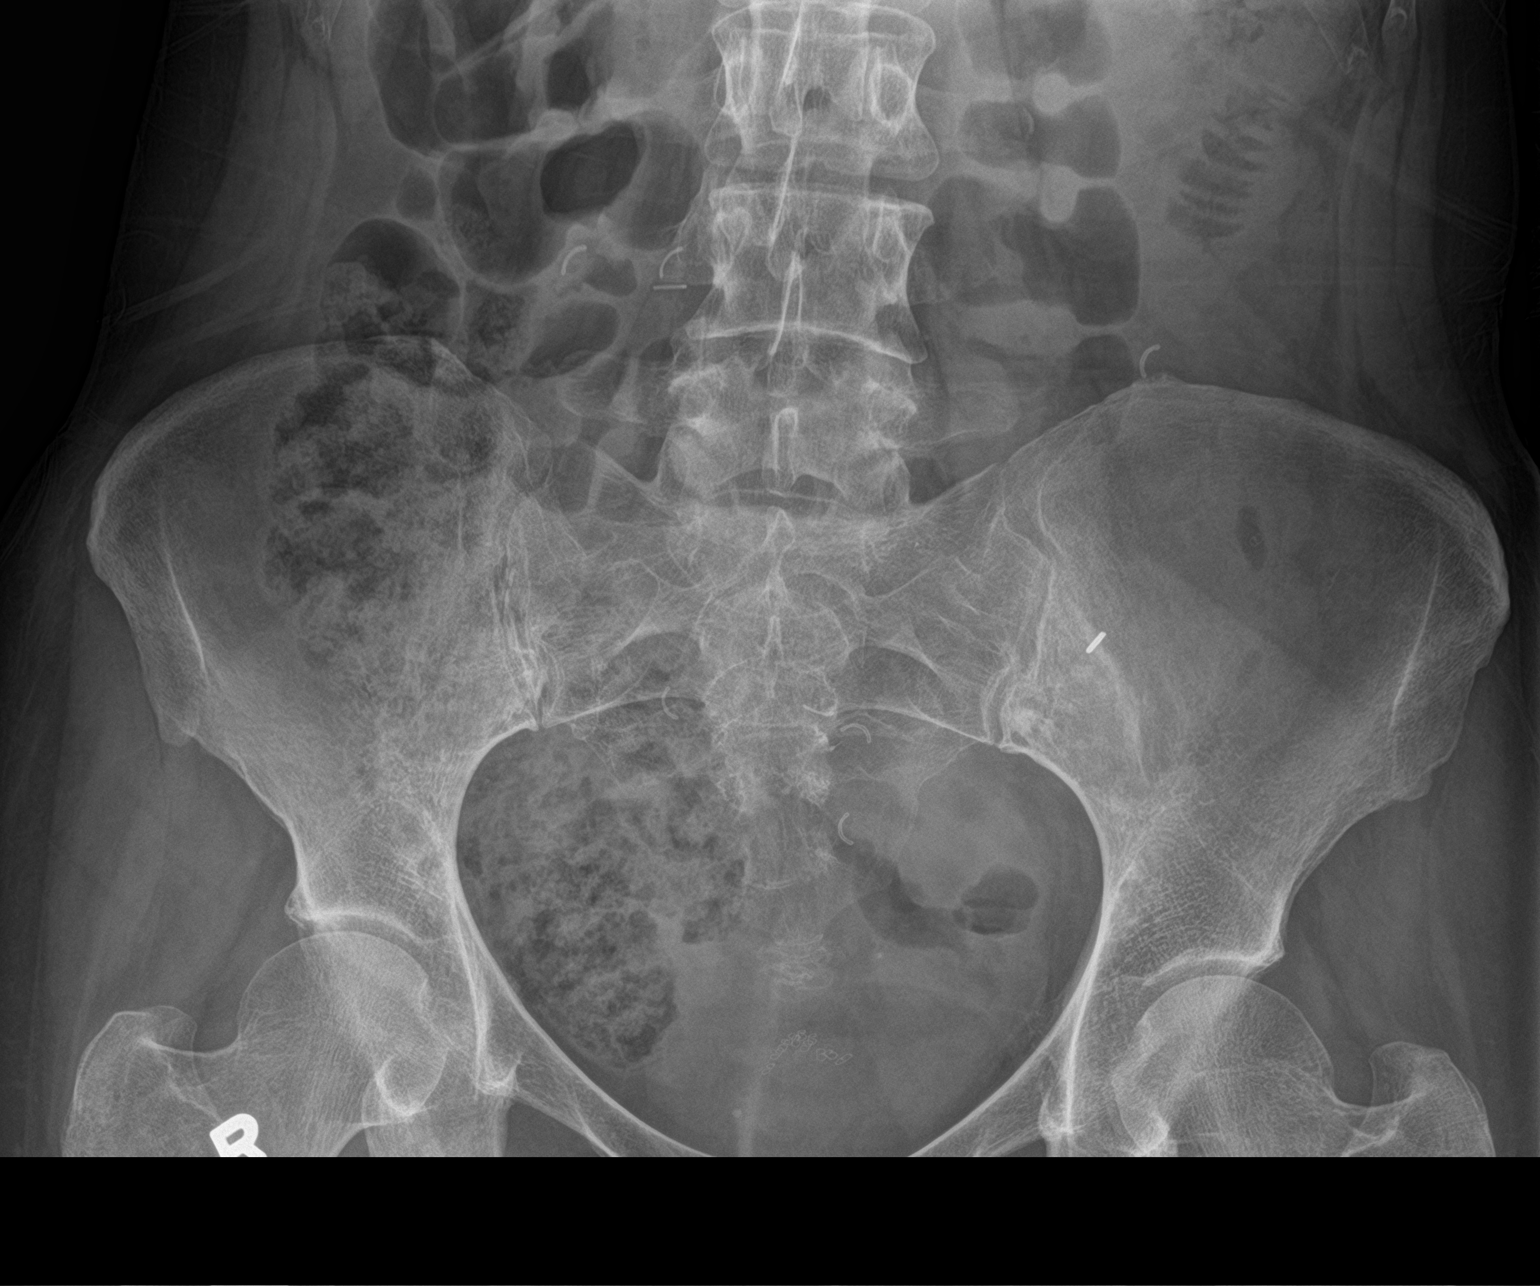

[2 of 2 positions shown; findings below may reference images not displayed]

FINDINGS: No stent is visualized in the abdomen or pelvis. No evidence of
plastic or metallic stent material. There are surgical clips
projecting over the right and left abdomen that are related to the
abdominal wall on CT. Enteric chain sutures noted in the sigmoid
colon. Normal bowel gas pattern with small to moderate colonic stool
burden. No visualized radiopaque calculi.
IMPRESSION: No stent visualized in the abdomen or pelvis.

## 2022-02-27 DIAGNOSIS — M1811 Unilateral primary osteoarthritis of first carpometacarpal joint, right hand: Secondary | ICD-10-CM | POA: Diagnosis not present

## 2022-02-27 DIAGNOSIS — M25521 Pain in right elbow: Secondary | ICD-10-CM | POA: Diagnosis not present

## 2022-02-27 DIAGNOSIS — M180 Bilateral primary osteoarthritis of first carpometacarpal joints: Secondary | ICD-10-CM | POA: Diagnosis not present

## 2022-02-27 DIAGNOSIS — M1812 Unilateral primary osteoarthritis of first carpometacarpal joint, left hand: Secondary | ICD-10-CM | POA: Diagnosis not present

## 2022-03-06 DIAGNOSIS — R300 Dysuria: Secondary | ICD-10-CM | POA: Diagnosis not present

## 2022-03-06 DIAGNOSIS — F411 Generalized anxiety disorder: Secondary | ICD-10-CM | POA: Diagnosis not present

## 2022-03-06 DIAGNOSIS — N3021 Other chronic cystitis with hematuria: Secondary | ICD-10-CM | POA: Diagnosis not present

## 2022-03-06 DIAGNOSIS — N390 Urinary tract infection, site not specified: Secondary | ICD-10-CM | POA: Diagnosis not present

## 2022-03-06 DIAGNOSIS — R309 Painful micturition, unspecified: Secondary | ICD-10-CM | POA: Diagnosis not present

## 2022-03-06 DIAGNOSIS — N3010 Interstitial cystitis (chronic) without hematuria: Secondary | ICD-10-CM | POA: Diagnosis not present

## 2022-03-06 DIAGNOSIS — R339 Retention of urine, unspecified: Secondary | ICD-10-CM | POA: Diagnosis not present

## 2022-03-06 DIAGNOSIS — R3589 Other polyuria: Secondary | ICD-10-CM | POA: Diagnosis not present

## 2022-03-07 DIAGNOSIS — B9620 Unspecified Escherichia coli [E. coli] as the cause of diseases classified elsewhere: Secondary | ICD-10-CM | POA: Diagnosis not present

## 2022-03-07 DIAGNOSIS — N390 Urinary tract infection, site not specified: Secondary | ICD-10-CM | POA: Diagnosis not present

## 2022-03-26 DIAGNOSIS — Z85038 Personal history of other malignant neoplasm of large intestine: Secondary | ICD-10-CM | POA: Diagnosis not present

## 2022-03-26 DIAGNOSIS — Z933 Colostomy status: Secondary | ICD-10-CM | POA: Diagnosis not present

## 2022-03-27 DIAGNOSIS — Z85038 Personal history of other malignant neoplasm of large intestine: Secondary | ICD-10-CM | POA: Diagnosis not present

## 2022-03-27 DIAGNOSIS — Z79899 Other long term (current) drug therapy: Secondary | ICD-10-CM | POA: Diagnosis not present

## 2022-03-27 DIAGNOSIS — Z5181 Encounter for therapeutic drug level monitoring: Secondary | ICD-10-CM | POA: Diagnosis not present

## 2022-03-27 DIAGNOSIS — M79604 Pain in right leg: Secondary | ICD-10-CM | POA: Diagnosis not present

## 2022-03-27 DIAGNOSIS — Z933 Colostomy status: Secondary | ICD-10-CM | POA: Diagnosis not present

## 2022-03-29 ENCOUNTER — Other Ambulatory Visit: Payer: Self-pay

## 2022-03-29 ENCOUNTER — Ambulatory Visit: Payer: BC Managed Care – PPO | Admitting: Internal Medicine

## 2022-03-29 ENCOUNTER — Encounter: Payer: Self-pay | Admitting: Internal Medicine

## 2022-03-29 VITALS — BP 120/81 | HR 92 | Temp 98.3°F | Wt 152.0 lb

## 2022-03-29 DIAGNOSIS — R300 Dysuria: Secondary | ICD-10-CM

## 2022-03-29 NOTE — Progress Notes (Signed)
Patient: Zoe Briggs  DOB: 05/07/1976 MRN: [id number] PCP: Jolinda Croak, MD  Referring Provider:     Patient Active Problem List   Diagnosis Date Noted   Acute pancreatitis 08/22/2019   Hair loss 08/18/2013   Recurrent UTI 11/04/2012   ADD (attention deficit disorder) 07/08/2012   Asthma 05/27/2012   Depression 05/27/2012   Colon cancer ([place]) 05/27/2012   Seasonal allergies 05/27/2012     Subjective:  Zoe Briggs is a 46 y.o. with past medical history as below referred to infectious disease by [facility], Dr. Lyna Poser, MD for recurrent Ecoli UTI.  Per documentation she was last seen on 02/08/2022 for interstitial cystitis flare(IC), recurrent URI was noted at 01/03/22 visit.. Followed by Urology since 01/23/19 for IC. Noted to have negative urine cultures on 5/19 visit.    Some improvement with physical therapy pelvic pelvic floor exercises.   She had declined CT hematuria protocol. Per urology, pt has diffuse erythema with recommended cystoscopy with bladder biopsy and fulguration.  Given her chronic interstitial cystitis, pelvic pain they would like to proceed with Hydro distention.   Imaging CT abdomen pelvis on 01/12/2019 showed small nonobstructing left renal calculus, no hydronephrosis. 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 She had undergone radiation/chemothroapy and colostomy for Stage IV(LN) in 2005 in [facility] in [place], [place].  Today7/20/23 : She is accompanied by a freind who was an Therapist, sports. She reports that with her UTI, she has worsening odor, dysuria, increased frequency, cloudy color. No hospitlazation with UTI. No abdominal pain. She reports she did not want to proceed with cystoscopy as her pain may get worse. She stated dysuria and urine urgency started back in April/March of this year and is relieved by antibiotics. No hospitalization for UTI.   Mircro:  02/10/22  Ecoli: AMP R, CTXR,  cefotaxime Frederich Balding, cefepime R, , aztreonam I(S Unasyn/Augmentin/ceftaz/erta/macrobid/bactrim/ levaquin/piptazo,teracyclin/_ 4/28 Ecoli: sens as above except cefepime I and S to ceftazidime 4/6 ESBLE Ecoli R amp, esbl aztreonam/cx/ceftaz/cefotaxime and cefazolin R/ cefepime R, tetracyline I 3/22 ESBL Ecoli above except I AMP/sulbactum, levaquin !   Review of Systems  All other systems reviewed and are negative.   Past Medical History:  Diagnosis Date   Allergy    Asthma    Seasonal   Colon cancer ([place]) 2005   Depression    IC (interstitial cystitis)    Migraines    Pancreatitis     Outpatient Medications Prior to Visit  Medication Sig Dispense Refill   acetaminophen (TYLENOL) 325 MG tablet Take 650 mg by mouth every 6 (six) hours as needed for mild pain or headache.     AJOVY 225 MG/1.5ML SOSY Inject 225 mg into the skin every 30 (thirty) days.     baclofen (LIORESAL) 10 MG tablet Take 10 mg by mouth every 8 (eight) hours as needed.     celecoxib (CELEBREX) 200 MG capsule Take 200 mg by mouth daily.     estradiol-norethindrone (COMBIPATCH) 0.05-0.14 MG/DAY Place 1 patch onto the skin 2 (two) times a week.     fexofenadine (ALLEGRA) 60 MG tablet Take 60 mg by mouth 2 (two) times daily.     Folic [id number] 3 Acid (MI-OMEGA PO) Take 1 capsule by mouth 2 (two) times daily.     hydrOXYzine (ATARAX) 10 MG tablet Take 10-20 mg by mouth daily as needed.     levalbuterol (XOPENEX HFA) 45 MCG/ACT inhaler Inhale 2 puffs into the lungs every 8 (  eight) hours as needed for wheezing. 1 Inhaler 1   methylphenidate 18 MG PO CR tablet Take 18 mg by mouth every morning.     Multiple Vitamin (MULTIVITAMIN) tablet Take 1 tablet by mouth daily.     nortriptyline (PAMELOR) 25 MG capsule Take 25 mg by mouth at bedtime. (Take with '10mg'$  capsule for a total of '35mg'$ )  0   Omega-3 Fatty Acids ([employer] OMEGA-3 GUMMY FISH/DHA) 113.5 MG CHEW Chew by mouth.     RYALTRIS [id number] MCG/ACT SUSP Place 2 sprays  into the nose 2 (two) times daily. 29 g 5   sertraline (ZOLOFT) 25 MG tablet Take 50 mg by mouth daily.   0   No facility-administered medications prior to visit.     Allergies  Allergen Reactions   Ciprofloxacin Nausea Only   Sulfa Antibiotics Other (See Comments)    abd pain   Imitrex [Sumatriptan] Rash   Iodinated Contrast Media Rash    Only when given together with chemo    Social History   Tobacco Use   Smoking status: Never   Smokeless tobacco: Never  Vaping Use   Vaping Use: Never used  Substance Use Topics   Alcohol use: Not Currently    Comment: Occ   Drug use: No    Family History  Problem Relation Age of Onset   Arthritis Mother    Heart disease Mother        Lorain Childes in the heart   Hypertension Mother    Cancer Paternal Aunt        brain   Cancer Paternal Grandfather        skin    Objective:  There were no vitals filed for this visit. There is no height or weight on file to calculate BMI.  Physical Exam Constitutional:      Appearance: Normal appearance.  HENT:     Head: Normocephalic and atraumatic.     Right Ear: Tympanic membrane normal.     Left Ear: Tympanic membrane normal.     Nose: Nose normal.     Mouth/Throat:     Mouth: Mucous membranes are moist.  Eyes:     Extraocular Movements: Extraocular movements intact.     Conjunctiva/sclera: Conjunctivae normal.     Pupils: Pupils are equal, round, and reactive to light.  Cardiovascular:     Rate and Rhythm: Normal rate and regular rhythm.     Heart sounds: No murmur heard.    No friction rub. No gallop.  Pulmonary:     Effort: Pulmonary effort is normal.     Breath sounds: Normal breath sounds.  Abdominal:     General: Abdomen is flat.     Palpations: Abdomen is soft.     Comments: colosotmy  Musculoskeletal:        General: Normal range of motion.  Skin:    General: Skin is warm and dry.  Neurological:     General: No focal deficit present.     Mental Status: She is alert and  oriented to person, place, and time.  Psychiatric:        Mood and Affect: Mood normal.     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 Called pt and plans to get them at next visit -Self start macrobid '100mg'$  PO bid x 5 days if pt has worsening dysuria and urgency. Pt is colonized with E coli. Urgency is noted with IC patients. Its unclear if dysuria is in the setting of UTI vs worsening IC(generally dysuria not noted with IC). I counseled pt to follow-up with urology for cystoscopy as her symptoms could be due to colonization and worsening IC -I discussed that as general practice would like to avoid chronic antibiotics as pt IC and symptoms of UTI are difficult to decipher and can bread resistance. Given that she has had dysuria for a while I am leaning towards her symptoms are c/w IC flare. Will need further work-up to see if there is a nidus of infection -F/U 3 months   I spent more than 75 minutes for this patient encounter including reviewing data/chart, and coordinating care and >50% direct face to face time providing counseling/discussing diagnostics/treatment plan with patient  [place] [facility]   03/29/22  9:51 AM

## 2022-04-12 DIAGNOSIS — F411 Generalized anxiety disorder: Secondary | ICD-10-CM | POA: Diagnosis not present

## 2022-04-16 DIAGNOSIS — R079 Chest pain, unspecified: Secondary | ICD-10-CM

## 2022-04-16 DIAGNOSIS — Z881 Allergy status to other antibiotic agents status: Secondary | ICD-10-CM | POA: Diagnosis not present

## 2022-04-16 DIAGNOSIS — N390 Urinary tract infection, site not specified: Secondary | ICD-10-CM | POA: Diagnosis not present

## 2022-04-16 DIAGNOSIS — R059 Cough, unspecified: Secondary | ICD-10-CM | POA: Diagnosis not present

## 2022-04-16 DIAGNOSIS — Z882 Allergy status to sulfonamides status: Secondary | ICD-10-CM | POA: Diagnosis not present

## 2022-04-16 DIAGNOSIS — Z87440 Personal history of urinary (tract) infections: Secondary | ICD-10-CM | POA: Diagnosis not present

## 2022-04-16 DIAGNOSIS — Z85038 Personal history of other malignant neoplasm of large intestine: Secondary | ICD-10-CM | POA: Diagnosis not present

## 2022-04-16 DIAGNOSIS — Z20822 Contact with and (suspected) exposure to covid-19: Secondary | ICD-10-CM | POA: Diagnosis not present

## 2022-04-16 DIAGNOSIS — K219 Gastro-esophageal reflux disease without esophagitis: Secondary | ICD-10-CM | POA: Diagnosis not present

## 2022-04-16 DIAGNOSIS — R002 Palpitations: Secondary | ICD-10-CM | POA: Diagnosis not present

## 2022-04-16 DIAGNOSIS — Z793 Long term (current) use of hormonal contraceptives: Secondary | ICD-10-CM | POA: Diagnosis not present

## 2022-04-16 DIAGNOSIS — Z79899 Other long term (current) drug therapy: Secondary | ICD-10-CM | POA: Diagnosis not present

## 2022-04-16 DIAGNOSIS — Z888 Allergy status to other drugs, medicaments and biological substances status: Secondary | ICD-10-CM | POA: Diagnosis not present

## 2022-04-16 DIAGNOSIS — R0789 Other chest pain: Secondary | ICD-10-CM | POA: Diagnosis not present

## 2022-04-16 DIAGNOSIS — R000 Tachycardia, unspecified: Secondary | ICD-10-CM | POA: Diagnosis not present

## 2022-04-16 HISTORY — DX: Chest pain, unspecified: R07.9

## 2022-04-27 DIAGNOSIS — Z85038 Personal history of other malignant neoplasm of large intestine: Secondary | ICD-10-CM | POA: Diagnosis not present

## 2022-04-27 DIAGNOSIS — Z933 Colostomy status: Secondary | ICD-10-CM | POA: Diagnosis not present

## 2022-05-01 DIAGNOSIS — N3020 Other chronic cystitis without hematuria: Secondary | ICD-10-CM | POA: Diagnosis not present

## 2022-05-01 DIAGNOSIS — R300 Dysuria: Secondary | ICD-10-CM | POA: Diagnosis not present

## 2022-05-04 DIAGNOSIS — Z933 Colostomy status: Secondary | ICD-10-CM | POA: Diagnosis not present

## 2022-05-04 DIAGNOSIS — Z85038 Personal history of other malignant neoplasm of large intestine: Secondary | ICD-10-CM | POA: Diagnosis not present

## 2022-05-08 DIAGNOSIS — M79671 Pain in right foot: Secondary | ICD-10-CM | POA: Insufficient documentation

## 2022-05-08 DIAGNOSIS — R8271 Bacteriuria: Secondary | ICD-10-CM | POA: Diagnosis not present

## 2022-05-21 ENCOUNTER — Encounter (HOSPITAL_BASED_OUTPATIENT_CLINIC_OR_DEPARTMENT_OTHER): Payer: Self-pay | Admitting: Urology

## 2022-05-21 DIAGNOSIS — F411 Generalized anxiety disorder: Secondary | ICD-10-CM | POA: Diagnosis not present

## 2022-05-24 ENCOUNTER — Encounter (HOSPITAL_COMMUNITY): Payer: Self-pay | Admitting: Anesthesiology

## 2022-05-24 ENCOUNTER — Other Ambulatory Visit: Payer: Self-pay

## 2022-05-24 ENCOUNTER — Encounter (HOSPITAL_BASED_OUTPATIENT_CLINIC_OR_DEPARTMENT_OTHER): Payer: Self-pay | Admitting: Urology

## 2022-05-24 ENCOUNTER — Other Ambulatory Visit: Payer: Self-pay | Admitting: Urology

## 2022-05-24 DIAGNOSIS — M79671 Pain in right foot: Secondary | ICD-10-CM | POA: Diagnosis not present

## 2022-05-24 NOTE — Progress Notes (Addendum)
Spoke w/ via phone for pre-op interview---pt Lab needs dos----none per anesthesia, surgery orders pending               Lab results------ COVID test -----patient states asymptomatic no test needed Arrive at ------1200 pm 06-04-2022- NPO after MN NO Solid Food.  Clear liquids from MN until---1100 am Med rec completed Medications to take morning of surgery -----xopenex inhaler prn/rbing inhaler, estradiol patch, methenamine Diabetic medication -----n/a Patient instructed no nail polish to be worn day of surgery Patient instructed to bring photo id and insurance card day of surgery Patient aware to have Driver (ride ) / caregiver  husband george   for 24 hours after surgery  Patient Special Instructions -----bring colostomy supplies Pre-Op special Istructions ----  surgery orders requested with pam Patient verbalized understanding of instructions that were given at this phone interview. Patient denies shortness of breath, chest pain, fever, cough at this phone interview.   Worked up at [facility] 04-16-2022 for chest pain, none since per patient (see care [facility] note) , ekg 04-16-2022 care [facility], chest xray 04-16-2022 care [facility]  Reviewed patient medical history with dr s Gifford Shave mda, pt ok for 06-04-2022 surgery at [facility] per dr s Gifford Shave, Simmie Davies ID  dr m singh  03-29-2022 epic  Pt wearing right foot boot for fracture

## 2022-05-31 DIAGNOSIS — M7751 Other enthesopathy of right foot: Secondary | ICD-10-CM | POA: Diagnosis not present

## 2022-05-31 DIAGNOSIS — S92351D Displaced fracture of fifth metatarsal bone, right foot, subsequent encounter for fracture with routine healing: Secondary | ICD-10-CM | POA: Diagnosis not present

## 2022-05-31 DIAGNOSIS — M792 Neuralgia and neuritis, unspecified: Secondary | ICD-10-CM | POA: Diagnosis not present

## 2022-06-04 ENCOUNTER — Ambulatory Visit (HOSPITAL_BASED_OUTPATIENT_CLINIC_OR_DEPARTMENT_OTHER): Admission: RE | Admit: 2022-06-04 | Payer: BC Managed Care – PPO | Source: Home / Self Care | Admitting: Urology

## 2022-06-04 DIAGNOSIS — Z01818 Encounter for other preprocedural examination: Secondary | ICD-10-CM

## 2022-06-04 HISTORY — DX: Attention-deficit hyperactivity disorder, unspecified type: F90.9

## 2022-06-04 HISTORY — DX: Personal history of COVID-19: Z86.16

## 2022-06-04 HISTORY — DX: Congenital malformation, unspecified: Q89.9

## 2022-06-04 HISTORY — DX: Gastro-esophageal reflux disease without esophagitis: K21.9

## 2022-06-04 HISTORY — DX: Sciatica, unspecified side: M54.30

## 2022-06-04 HISTORY — DX: Unspecified fracture of right foot, initial encounter for closed fracture: S92.901A

## 2022-06-04 SURGERY — CYSTOSCOPY, WITH BLADDER HYDRODISTENSION AND BIOPSY
Anesthesia: General | Laterality: Bilateral

## 2022-06-29 ENCOUNTER — Ambulatory Visit: Payer: BC Managed Care – PPO | Admitting: Internal Medicine

## 2022-07-02 DIAGNOSIS — N390 Urinary tract infection, site not specified: Secondary | ICD-10-CM | POA: Diagnosis not present

## 2022-07-02 DIAGNOSIS — B9620 Unspecified Escherichia coli [E. coli] as the cause of diseases classified elsewhere: Secondary | ICD-10-CM | POA: Diagnosis not present

## 2022-07-12 DIAGNOSIS — M1811 Unilateral primary osteoarthritis of first carpometacarpal joint, right hand: Secondary | ICD-10-CM | POA: Diagnosis not present

## 2022-07-12 DIAGNOSIS — M1812 Unilateral primary osteoarthritis of first carpometacarpal joint, left hand: Secondary | ICD-10-CM | POA: Diagnosis not present

## 2022-07-12 DIAGNOSIS — M180 Bilateral primary osteoarthritis of first carpometacarpal joints: Secondary | ICD-10-CM | POA: Diagnosis not present

## 2022-07-20 DIAGNOSIS — Z933 Colostomy status: Secondary | ICD-10-CM | POA: Diagnosis not present

## 2022-07-20 DIAGNOSIS — N3020 Other chronic cystitis without hematuria: Secondary | ICD-10-CM | POA: Diagnosis not present

## 2022-07-20 DIAGNOSIS — Z85038 Personal history of other malignant neoplasm of large intestine: Secondary | ICD-10-CM | POA: Diagnosis not present

## 2022-07-20 DIAGNOSIS — R102 Pelvic and perineal pain: Secondary | ICD-10-CM | POA: Diagnosis not present

## 2022-07-23 DIAGNOSIS — Z85038 Personal history of other malignant neoplasm of large intestine: Secondary | ICD-10-CM | POA: Diagnosis not present

## 2022-07-23 DIAGNOSIS — Z933 Colostomy status: Secondary | ICD-10-CM | POA: Diagnosis not present

## 2022-07-24 DIAGNOSIS — Z933 Colostomy status: Secondary | ICD-10-CM | POA: Diagnosis not present

## 2022-07-24 DIAGNOSIS — Z85038 Personal history of other malignant neoplasm of large intestine: Secondary | ICD-10-CM | POA: Diagnosis not present

## 2022-07-26 ENCOUNTER — Other Ambulatory Visit: Payer: Self-pay | Admitting: Urology

## 2022-08-11 ENCOUNTER — Ambulatory Visit: Payer: Self-pay

## 2022-08-24 DIAGNOSIS — R8271 Bacteriuria: Secondary | ICD-10-CM | POA: Diagnosis not present

## 2022-09-11 DIAGNOSIS — M109 Gout, unspecified: Secondary | ICD-10-CM | POA: Diagnosis not present

## 2022-09-11 DIAGNOSIS — L03115 Cellulitis of right lower limb: Secondary | ICD-10-CM | POA: Diagnosis not present

## 2022-09-14 ENCOUNTER — Encounter (HOSPITAL_BASED_OUTPATIENT_CLINIC_OR_DEPARTMENT_OTHER): Payer: Self-pay | Admitting: Urology

## 2022-09-14 ENCOUNTER — Other Ambulatory Visit: Payer: Self-pay

## 2022-09-14 NOTE — Anesthesia Preprocedure Evaluation (Signed)
Anesthesia Evaluation  Patient identified by MRN, date of birth, ID band Patient awake    Reviewed: Allergy & Precautions, NPO status , Patient's Chart, lab work & pertinent test results  History of Anesthesia Complications Negative for: history of anesthetic complications  Airway Mallampati: II  TM Distance: >3 FB Neck ROM: Full   Comment: Previous grade I view with [employer]   Pulmonary neg shortness of breath, asthma , neg sleep apnea, neg COPD, neg recent URI   Pulmonary exam normal breath sounds clear to auscultation       Cardiovascular negative cardio ROS  Rhythm:Regular Rate:Normal     Neuro/Psych  Headaches PSYCHIATRIC DISORDERS (ADD)  Depression     Neuromuscular disease (sciatica)    GI/Hepatic Neg liver ROS,GERD  ,,  Endo/Other  negative endocrine ROS    Renal/GU negative Renal ROS   Interstitial cystitis bladder lesion, recurrent UTIs    Musculoskeletal   Abdominal  (+) - obese  Peds  Hematology negative hematology ROS (+)   Anesthesia Other Findings Completed 5 day prednisone course 09/16/22  H/o colon cancer 2005 s/p chemo/radiation, surgery  Reproductive/Obstetrics                             Anesthesia Physical Anesthesia Plan  ASA: 2  Anesthesia Plan: General   Post-op Pain Management:    Induction: Intravenous  PONV Risk Score and Plan: 3 and Ondansetron, Dexamethasone and Treatment may vary due to age or medical condition  Airway Management Planned: LMA  Additional Equipment:   Intra-op Plan:   Post-operative Plan: Extubation in OR  Informed Consent: I have reviewed the patients History and Physical, chart, labs and discussed the procedure including the risks, benefits and alternatives for the proposed anesthesia with the patient or authorized representative who has indicated his/her understanding and acceptance.     Dental advisory given  Plan  Discussed with: CRNA and Anesthesiologist  Anesthesia Plan Comments: (Risks of general anesthesia discussed including, but not limited to, sore throat, hoarse voice, chipped/damaged teeth, injury to vocal cords, nausea and vomiting, allergic reactions, lung infection, heart attack, stroke, and death. All questions answered. )        Anesthesia Quick Evaluation

## 2022-09-14 NOTE — Progress Notes (Signed)
Spoke w/ via phone for pre-op interview---Zoe Briggs needs dos----  none             Briggs results------none COVID test -----patient states asymptomatic no test needed Arrive at -------0730 on 09/17/2022 NPO after MN NO Solid Food.  Clear liquids from MN until---0630 Med rec completed Medications to take morning of surgery -----Ubrevly prn, Baclofen prn, Zyrtec, Atarax prn, Xopenex prn, Urobel, estrogen patch Diabetic medication -----n/a Patient instructed no nail polish to be worn day of surgery Patient instructed to bring photo id and insurance card day of surgery Patient aware to have Driver (ride ) / caregiver    for 24 hours after surgery - friend, Zoe Briggs (driver), husband - caregiver Patient Special Instructions -----No body piercings. Pre-Op special Istructions -----none Patient verbalized understanding of instructions that were given at this phone interview. Patient denies shortness of breath, chest pain, fever, cough at this phone interview.  Surgery rescheduled from 05/2022. Please see progress note dated 05/24/22 by Zelphia Cairo, RN for additional info.

## 2022-09-17 ENCOUNTER — Ambulatory Visit (HOSPITAL_BASED_OUTPATIENT_CLINIC_OR_DEPARTMENT_OTHER)
Admission: RE | Admit: 2022-09-17 | Discharge: 2022-09-17 | Disposition: A | Discharge status: Home / Self Care | Payer: BC Managed Care – PPO | Attending: Urology | Admitting: Urology

## 2022-09-17 ENCOUNTER — Encounter (HOSPITAL_BASED_OUTPATIENT_CLINIC_OR_DEPARTMENT_OTHER)
Admission: RE | Disposition: A | Discharge status: Home / Self Care | Payer: Self-pay | Source: Home / Self Care | Attending: Urology

## 2022-09-17 ENCOUNTER — Ambulatory Visit (HOSPITAL_BASED_OUTPATIENT_CLINIC_OR_DEPARTMENT_OTHER): Payer: BC Managed Care – PPO | Admitting: Anesthesiology

## 2022-09-17 ENCOUNTER — Encounter (HOSPITAL_BASED_OUTPATIENT_CLINIC_OR_DEPARTMENT_OTHER): Payer: Self-pay | Admitting: Urology

## 2022-09-17 ENCOUNTER — Other Ambulatory Visit: Payer: Self-pay

## 2022-09-17 DIAGNOSIS — Z01818 Encounter for other preprocedural examination: Secondary | ICD-10-CM

## 2022-09-17 DIAGNOSIS — Z923 Personal history of irradiation: Secondary | ICD-10-CM | POA: Insufficient documentation

## 2022-09-17 DIAGNOSIS — N3592 Unspecified urethral stricture, female: Secondary | ICD-10-CM | POA: Insufficient documentation

## 2022-09-17 DIAGNOSIS — N3040 Irradiation cystitis without hematuria: Secondary | ICD-10-CM | POA: Insufficient documentation

## 2022-09-17 DIAGNOSIS — Z9221 Personal history of antineoplastic chemotherapy: Secondary | ICD-10-CM | POA: Insufficient documentation

## 2022-09-17 DIAGNOSIS — I781 Nevus, non-neoplastic: Secondary | ICD-10-CM | POA: Diagnosis not present

## 2022-09-17 DIAGNOSIS — Y842 Radiological procedure and radiotherapy as the cause of abnormal reaction of the patient, or of later complication, without mention of misadventure at the time of the procedure: Secondary | ICD-10-CM | POA: Diagnosis not present

## 2022-09-17 DIAGNOSIS — F32A Depression, unspecified: Secondary | ICD-10-CM | POA: Diagnosis not present

## 2022-09-17 DIAGNOSIS — R102 Pelvic and perineal pain: Secondary | ICD-10-CM | POA: Insufficient documentation

## 2022-09-17 DIAGNOSIS — Z87440 Personal history of urinary (tract) infections: Secondary | ICD-10-CM | POA: Diagnosis not present

## 2022-09-17 DIAGNOSIS — N3289 Other specified disorders of bladder: Secondary | ICD-10-CM | POA: Diagnosis not present

## 2022-09-17 DIAGNOSIS — N3010 Interstitial cystitis (chronic) without hematuria: Secondary | ICD-10-CM | POA: Diagnosis not present

## 2022-09-17 DIAGNOSIS — Z85038 Personal history of other malignant neoplasm of large intestine: Secondary | ICD-10-CM | POA: Insufficient documentation

## 2022-09-17 DIAGNOSIS — N3020 Other chronic cystitis without hematuria: Secondary | ICD-10-CM | POA: Diagnosis not present

## 2022-09-17 HISTORY — DX: Presence of spectacles and contact lenses: Z97.3

## 2022-09-17 HISTORY — DX: Polyneuropathy, unspecified: G62.9

## 2022-09-17 HISTORY — PX: CYSTOSCOPY WITH HYDRODISTENSION AND BIOPSY: SHX5127

## 2022-09-17 SURGERY — CYSTOSCOPY, WITH BLADDER HYDRODISTENSION AND BIOPSY
Anesthesia: General | Site: Urethra | Laterality: Bilateral

## 2022-09-17 MED ORDER — CEFAZOLIN SODIUM-DEXTROSE 2-4 GM/100ML-% IV SOLN
2.0000 g | INTRAVENOUS | Status: AC
  Administered 2022-09-17: 2 g via INTRAVENOUS

## 2022-09-17 MED ORDER — PHENYLEPHRINE 80 MCG/ML (10ML) SYRINGE FOR IV PUSH (FOR BLOOD PRESSURE SUPPORT)
PREFILLED_SYRINGE | INTRAVENOUS | Status: AC
  Filled 2022-09-17: qty 10

## 2022-09-17 MED ORDER — FENTANYL CITRATE (PF) 100 MCG/2ML IJ SOLN
INTRAMUSCULAR | Status: DC | PRN
  Administered 2022-09-17 (×2): 25 ug via INTRAVENOUS

## 2022-09-17 MED ORDER — MIDAZOLAM HCL 5 MG/5ML IJ SOLN
INTRAMUSCULAR | Status: DC | PRN
  Administered 2022-09-17: 2 mg via INTRAVENOUS

## 2022-09-17 MED ORDER — STERILE WATER FOR IRRIGATION IR SOLN
Status: DC | PRN
  Administered 2022-09-17 (×2): 3000 mL
  Administered 2022-09-17: 500 mL

## 2022-09-17 MED ORDER — PROPOFOL 10 MG/ML IV BOLUS
INTRAVENOUS | Status: DC | PRN
  Administered 2022-09-17: 30 mg via INTRAVENOUS
  Administered 2022-09-17: 150 mg via INTRAVENOUS

## 2022-09-17 MED ORDER — FENTANYL CITRATE (PF) 100 MCG/2ML IJ SOLN
INTRAMUSCULAR | Status: AC
  Filled 2022-09-17: qty 2

## 2022-09-17 MED ORDER — OXYCODONE HCL 5 MG PO TABS: 5.0000 mg | ORAL_TABLET | Freq: Once | ORAL | Status: DC | PRN

## 2022-09-17 MED ORDER — BUPIVACAINE HCL 0.5 % IJ SOLN: INTRAMUSCULAR | Status: DC | PRN

## 2022-09-17 MED ORDER — CEFAZOLIN SODIUM-DEXTROSE 2-4 GM/100ML-% IV SOLN
INTRAVENOUS | Status: AC
  Filled 2022-09-17: qty 100

## 2022-09-17 MED ORDER — PROMETHAZINE HCL 25 MG/ML IJ SOLN: 6.2500 mg | INTRAMUSCULAR | Status: DC | PRN

## 2022-09-17 MED ORDER — OXYCODONE HCL 5 MG/5ML PO SOLN: 5.0000 mg | Freq: Once | ORAL | Status: DC | PRN

## 2022-09-17 MED ORDER — PHENAZOPYRIDINE HCL 200 MG PO TABS
ORAL | Status: DC | PRN
  Administered 2022-09-17: 15 mL via INTRAVESICAL

## 2022-09-17 MED ORDER — LACTATED RINGERS IV SOLN: INTRAVENOUS | Status: DC

## 2022-09-17 MED ORDER — PHENYLEPHRINE 80 MCG/ML (10ML) SYRINGE FOR IV PUSH (FOR BLOOD PRESSURE SUPPORT)
PREFILLED_SYRINGE | INTRAVENOUS | Status: DC | PRN
  Administered 2022-09-17 (×3): 80 ug via INTRAVENOUS
  Administered 2022-09-17: 160 ug via INTRAVENOUS

## 2022-09-17 MED ORDER — HYDROCODONE-ACETAMINOPHEN 5-325 MG PO TABS: 1.0000 | ORAL_TABLET | ORAL | 0 refills | Status: AC | PRN

## 2022-09-17 MED ORDER — ACETAMINOPHEN 500 MG PO TABS
1000.0000 mg | ORAL_TABLET | Freq: Once | ORAL | Status: AC
  Administered 2022-09-17: 1000 mg via ORAL

## 2022-09-17 MED ORDER — ONDANSETRON HCL 4 MG/2ML IJ SOLN
INTRAMUSCULAR | Status: AC
  Filled 2022-09-17: qty 2

## 2022-09-17 MED ORDER — DEXAMETHASONE SODIUM PHOSPHATE 10 MG/ML IJ SOLN
INTRAMUSCULAR | Status: DC | PRN
  Administered 2022-09-17: 5 mg via INTRAVENOUS

## 2022-09-17 MED ORDER — LIDOCAINE 2% (20 MG/ML) 5 ML SYRINGE
INTRAMUSCULAR | Status: DC | PRN
  Administered 2022-09-17: 100 mg via INTRAVENOUS

## 2022-09-17 MED ORDER — DEXAMETHASONE SODIUM PHOSPHATE 10 MG/ML IJ SOLN
INTRAMUSCULAR | Status: AC
  Filled 2022-09-17: qty 1

## 2022-09-17 MED ORDER — PROPOFOL 10 MG/ML IV BOLUS
INTRAVENOUS | Status: AC
  Filled 2022-09-17: qty 20

## 2022-09-17 MED ORDER — ACETAMINOPHEN 500 MG PO TABS
ORAL_TABLET | ORAL | Status: AC
  Filled 2022-09-17: qty 2

## 2022-09-17 MED ORDER — FENTANYL CITRATE (PF) 100 MCG/2ML IJ SOLN
25.0000 ug | INTRAMUSCULAR | Status: DC | PRN
  Administered 2022-09-17: 25 ug via INTRAVENOUS

## 2022-09-17 MED ORDER — IOHEXOL 300 MG/ML  SOLN
INTRAMUSCULAR | Status: DC | PRN
  Administered 2022-09-17: 19 mL via URETHRAL

## 2022-09-17 MED ORDER — MIDAZOLAM HCL 2 MG/2ML IJ SOLN
INTRAMUSCULAR | Status: AC
  Filled 2022-09-17: qty 2

## 2022-09-17 MED ORDER — LIDOCAINE HCL (PF) 2 % IJ SOLN
INTRAMUSCULAR | Status: AC
  Filled 2022-09-17: qty 5

## 2022-09-17 MED ORDER — ONDANSETRON HCL 4 MG/2ML IJ SOLN
INTRAMUSCULAR | Status: DC | PRN
  Administered 2022-09-17: 4 mg via INTRAVENOUS

## 2022-09-17 SURGICAL SUPPLY — 21 items
BAG DRAIN URO-CYSTO SKYTR STRL (DRAIN) ×1 IMPLANT
BAG DRN UROCATH (DRAIN) ×1
CATH ROBINSON RED A/P 16FR (CATHETERS) IMPLANT
CATH URETL OPEN END 6FR 70 (CATHETERS) IMPLANT
CLOTH BEACON ORANGE TIMEOUT ST (SAFETY) ×1 IMPLANT
DRSG TELFA 3X8 NADH STRL (GAUZE/BANDAGES/DRESSINGS) IMPLANT
ELECT REM PT RETURN 9FT ADLT (ELECTROSURGICAL) ×1
ELECTRODE REM PT RTRN 9FT ADLT (ELECTROSURGICAL) ×1 IMPLANT
GLOVE BIO SURGEON STRL SZ7.5 (GLOVE) ×1 IMPLANT
GOWN STRL REUS W/TWL LRG LVL3 (GOWN DISPOSABLE) ×2 IMPLANT
KIT TURNOVER CYSTO (KITS) ×1 IMPLANT
MANIFOLD NEPTUNE II (INSTRUMENTS) ×1 IMPLANT
NDL SAFETY ECLIP 18X1.5 (MISCELLANEOUS) IMPLANT
NEEDLE HYPO 22GX1.5 SAFETY (NEEDLE) IMPLANT
NS IRRIG 500ML POUR BTL (IV SOLUTION) IMPLANT
PACK CYSTO (CUSTOM PROCEDURE TRAY) ×1 IMPLANT
SYR 20ML LL LF (SYRINGE) IMPLANT
SYR TOOMEY IRRIG 70ML (MISCELLANEOUS) ×1
SYRINGE TOOMEY IRRIG 70ML (MISCELLANEOUS) IMPLANT
TUBE CONNECTING 12X1/4 (SUCTIONS) IMPLANT
WATER STERILE IRR 3000ML UROMA (IV SOLUTION) ×1 IMPLANT

## 2022-09-17 NOTE — Discharge Instructions (Signed)
     CYSTOSCOPY HOME CARE INSTRUCTIONS  Activity: Rest for the remainder of the day.  Do not drive or operate equipment today.  You may resume normal activities in one to two days as instructed by your physician.   Meals: Drink plenty of liquids and eat light foods such as gelatin or soup this evening.  You may return to a normal meal plan tomorrow.  Return to Work: You may return to work in one to two days or as instructed by your physician.  Special Instructions / Symptoms: Call your physician if any of these symptoms occur:   -persistent or heavy bleeding  -bleeding which continues after first few urination  -large blood clots that are difficult to pass  -urine stream diminishes or stops completely  -fever equal to or higher than 101 degrees Farenheit.  -cloudy urine with a strong, foul odor  -severe pain  Females should always wipe from front to back after elimination.  You may feel some burning pain when you urinate.  This should disappear with time.  Applying moist heat to the lower abdomen or a hot tub bath may help relieve the pain. \  Follow-Up / Date of Return Visit to Your Physician:   Call for an appointment to arrange follow-up.  Patient Signature:  ________________________________________________________  Nurse's Signature:  ________________________________________________________-     No acetaminophen/Tylenol until after 2:00 pm today if needed.   Post Anesthesia Home Care Instructions  Activity: Get plenty of rest for the remainder of the day. A responsible individual must stay with you for 24 hours following the procedure.  For the next 24 hours, DO NOT: -Drive a car -Paediatric nurse -Drink alcoholic beverages -Take any medication unless instructed by your physician -Make any legal decisions or sign important papers.  Meals: Start with liquid foods such as gelatin or soup. Progress to regular foods as tolerated. Avoid greasy, spicy, heavy foods.  If nausea and/or vomiting occur, drink only clear liquids until the nausea and/or vomiting subsides. Call your physician if vomiting continues.  Special Instructions/Symptoms: Your throat may feel dry or sore from the anesthesia or the breathing tube placed in your throat during surgery. If this causes discomfort, gargle with warm salt water. The discomfort should disappear within 24 hours.

## 2022-09-17 NOTE — Anesthesia Procedure Notes (Signed)
Procedure Name: LMA Insertion Date/Time: 09/17/2022 9:43 AM  Performed by: Rogers Blocker, CRNAPre-anesthesia Checklist: Patient identified, Emergency Drugs available, Suction available and Patient being monitored Patient Re-evaluated:Patient Re-evaluated prior to induction Oxygen Delivery Method: Circle System Utilized Preoxygenation: Pre-oxygenation with 100% oxygen Induction Type: IV induction Ventilation: Mask ventilation without difficulty LMA: LMA inserted LMA Size: 4.0 Number of attempts: 1 Airway Equipment and Method: Bite block Placement Confirmation: positive ETCO2 Tube secured with: Tape Dental Injury: Teeth and Oropharynx as per pre-operative assessment

## 2022-09-17 NOTE — H&P (Signed)
CC/HPI: CC: Renal calculus.  HPI:  01/23/2019  This is a patient that we follow for interstitial cystitis. This is currently controlled with elmiron. She recently went to the emergency department on 01/12/2019. This was for severe left-sided back pain. She underwent a CT scan of the abdomen and pelvis. This revealed a 1 mm punctate left renal calculus. Urinalysis is negative today. Pain is resolved. She feels like she passed a stone. She never saw anything pass. She denies hematuria or dysuria.   05/23/2021  Patient with a history of interstitial cystitis. She had not had a flare up for 2 years but recently had a flare up. Was told she did not have a UTI at urgent care. Was not started on antibiotics. She has been taking Uribel and is starting to get relief. She used to take Elmiron but started have some vision problems and so she stopped this appropriately.   01/03/2022  Patient has had recurrent ESBL. She has taken Macrobid followed by Augmentin. Despite this, her symptoms soon returned. She currently has some urine odor and frequency, urgency. Feels like UTI. Is concerned about these recurrent urinary tract infections. Concerned about taking long-term antibiotics. She is taking some over-the-counter supplements. We talked specifically about d-mannose and cranberry supplementation.   01/26/2022  Patient again has symptoms of UTI with pelvic pain and frequency, urgency, dysuria. Declined PVR today. Had a low-grade fever close to 100 yesterday. Generally feeling unwell. Denies any flank pain. Was not able to undergo CT scan yet. She rescheduled it to take care of some other things.   02/08/2022  Patient complains of interstitial cystitis flare. At the last visit, urine culture was negative. She did have some microscopic hematuria. She canceled her CT hematuria protocol on the day of the scheduled CT scan. She felt like she did not need it. She continues to have severe bladder spasms and some dysuria. She has  started taking Elmiron. She has had some improvement and continues on pelvic floor physical therapy as well. She is here for cystoscopy as well as a bladder instillation.   05/01/2022  In the interval, the patient saw infectious disease. Cystoscopy recommended as we had discussed here before. She comes in today to discuss. She has done a lot of reading. Asked me about my methenamine, intravesical instillation of antibiotics, as well as some other therapies including immunomodulators. She is currently being treated for UTI with Augmentin. Urinalysis negative today.   07/20/2022  In the interval, the patient had severe cystitis. Prescribed Augmentin for positive urine culture. Her symptoms have resolved. She completed the antibiotics a couple of days ago. When she has the episodes, she experiences severe pain. She wants to have a referral to pain management clinic. She does take systemic estrogen as well as testosterone. She has a history of radiation to the pelvis for colon cancer treatment. She continues on d-mannose. She is about to start back her methenamine. She wants to proceed with procedural management with cystoscopy, hydrodistention, bladder biopsy and fulguration. Interested in hyperbaric oxygen as well.   09/17/2022 Patient presents today for cystoscopy with hydrodistention with bladder biopsy and fulguration    ALLERGIES: Imitrex TABS - Other Reaction, Itching, neck pain Sulfa    MEDICATIONS: Elmiron 100 mg capsule 1 capsule PO TID  CeleXA 20 MG Oral Tablet Oral  Combipatch  Concerta 27 mg tablet, extended release 24 hr  Flonase Allergy Relief 1 As Directed  Lyrica  Methenamine Hippurate 1 gram tablet 1 tablet PO BID  Multi Vitamin/Minerals TABS Oral  Nortriptyline Hcl 10 mg capsule Oral  Orphenadrine Citrate  Ultram 50 mg tablet 0 Oral  Uribel 118 mg-10 mg-40.8 mg-36 mg-0.12 mg capsule 1 capsule PO Q 8 H  Vitamin D TABS Oral  Zoloft  ZyrTEC Allergy 10 MG Oral Tablet 0 Oral      GU PSH: Cysto Dilate Stricture (M or F) - 02/08/2022     NON-GU PSH: Foot surgery (unspecified) Partial Remove Colon     GU PMH: Chronic cystitis (w/o hematuria) - 05/01/2022, - 01/26/2022, - 01/03/2022 Dysuria - 05/01/2022, (Stable), - 02/08/2022 (Stable), - 01/26/2022, Dysuria, - 2016 Interstitial Cystitis (w/o hematuria) - 02/08/2022, - 12/12/2021, - 11/27/2021, - 05/23/2021, - 2017 Overactive bladder - 02/08/2022 Pelvic/perineal pain - 02/08/2022, - 01/18/2022, - 01/08/2022, - 01/02/2022, - 12/25/2021, - 11/27/2021, - 05/23/2021 Urinary Frequency (Stable) - 01/26/2022, - 2018, Increased urinary frequency, - 2016 Mixed incontinence - 01/08/2022, - 01/02/2022, - 12/25/2021, - 2018 History of urolithiasis - 01/03/2022 Stress Incontinence - 05/23/2021 Renal calculus - 2020 Candidiasis of vulva and vagina - 2019 Suprapubic pain - 2017 Oth GU systems Signs/Symptoms, Bladder pain - 2017 Urinary Tract Inf, Unspec site, Pyuria - 2016      PMH Notes: .   NON-GU PMH: Muscle weakness (generalized) - 01/18/2022, - 01/08/2022, - 01/02/2022, - 12/25/2021, - 12/12/2021, - 2018 Other muscle spasm - 01/18/2022, - 01/08/2022, - 01/02/2022, - 12/25/2021, - 12/12/2021 Other specified disorders of muscle - 01/18/2022, - 01/08/2022, - 01/02/2022, - 12/25/2021, - 12/12/2021 Thoracic back pain - 2019 Other lack of coordination - 2018 Encounter for general adult medical examination without abnormal findings, Encounter for preventive health examination - 2017 Anxiety, Anxiety (Symptom) - 2014 Malignant neoplasm of rectum, Rectal cancer - 2014 Personal history of other diseases of the digestive system, History of esophageal reflux - 2014 Personal history of other diseases of the nervous system and sense organs, History of migraine headaches - 2014 Radiation sickness, unspecified, sequela, Late effect of radiation - 2014 Colon Cancer, History    FAMILY HISTORY: Death - Father Family Health Status Children is 1 daughter and 1 - Runs In  Family Hypertension - Runs In Family Pure Hypercholesterolemia - Runs In Family Reported A Previous Heart Murmur - Runs In Family   SOCIAL HISTORY: Marital Status: Married Preferred Language: English; Ethnicity: Not Hispanic Or Latino; Race: White Current Smoking Status: Patient has never smoked.  Social Drinker.  Drinks 2 caffeinated drinks per day. Patient's occupation Airline pilot.    REVIEW OF SYSTEMS:    GU Review Female:   Patient denies frequent urination, hard to postpone urination, burning /pain with urination, get up at night to urinate, leakage of urine, stream starts and stops, trouble starting your stream, have to strain to urinate, and being pregnant.  Gastrointestinal (Upper):   Patient denies nausea, vomiting, and indigestion/ heartburn.  Gastrointestinal (Lower):   Patient denies diarrhea and constipation.  Constitutional:   Patient denies fever, night sweats, weight loss, and fatigue.  Skin:   Patient denies itching and skin rash/ lesion.  Eyes:   Patient denies blurred vision and double vision.  Ears/ Nose/ Throat:   Patient denies sore throat and sinus problems.  Hematologic/Lymphatic:   Patient denies swollen glands and easy bruising.  Cardiovascular:   Patient denies leg swelling and chest pains.  Respiratory:   Patient denies cough and shortness of breath.  Endocrine:   Patient denies excessive thirst.  Musculoskeletal:   Patient denies back pain and joint pain.  Neurological:  Patient denies headaches and dizziness.  Psychologic:   Patient denies depression and anxiety.   BP 129/89   Pulse 86   Temp 97.6 F (36.4 C) (Oral)   Resp 16   Ht '5\' 10"'$  (1.778 m)   Wt 71.2 kg   SpO2 98%   BMI 22.53 kg/m    MULTI-SYSTEM PHYSICAL EXAMINATION:    Constitutional: Well-nourished. No physical deformities. Normally developed. Good grooming.     Complexity of Data:  Source Of History:  Patient  Records Review:   Previous Doctor Records, Previous Patient  Records   PROCEDURES: None   ASSESSMENT:      ICD-10 Details  1 GU:   Chronic cystitis (w/o hematuria) - N30.20 Chronic, Stable  2   Interstitial Cystitis (w/o hematuria) - N30.10 Chronic, Stable  3   Pelvic/perineal pain - R10.2 Chronic, Stable   PLAN:        She would like to proceed with cystoscopy with hydrodistention and bladder biopsy and fulguration. If this confirms radiation cystitis, hyperbaric oxygen is a good option.

## 2022-09-17 NOTE — Anesthesia Postprocedure Evaluation (Signed)
Anesthesia Post Note  Patient: Adella Manolis  Procedure(s) Performed: CYSTOSCOPY/BIOPSY/HYDRODISTENSION FULGURATION BILATERAL RETROGRADE PYELOGRAM (Bilateral: Urethra)     Patient location during evaluation: PACU Anesthesia Type: General Level of consciousness: awake Pain management: pain level controlled Vital Signs Assessment: post-procedure vital signs reviewed and stable Respiratory status: spontaneous breathing, nonlabored ventilation and respiratory function stable Cardiovascular status: blood pressure returned to baseline and stable Postop Assessment: no apparent nausea or vomiting Anesthetic complications: no   No notable events documented.  Last Vitals:  Vitals:   09/17/22 1030 09/17/22 1045  BP: (!) 138/106 (!) 137/101  Pulse: 77 66  Resp: (!) 23 11  Temp: 36.4 C   SpO2: 100% 100%    Last Pain:  Vitals:   09/17/22 1045  TempSrc:   PainSc: 3                  Nilda Simmer

## 2022-09-17 NOTE — Interval H&P Note (Signed)
History and Physical Interval Note:  09/17/2022 9:07 AM  Zoe Briggs  has presented today for surgery, with the diagnosis of L[place]le.  The various methods of treatment have been discussed with the patient and family. After consideration of risks, benefits and other options for treatment, the patient has consented to  Procedure(s) with comments: CYSTOSCOPY/BIOPSY/HYDRODISTENSION FULGURATION BILATERAL RETROGRADE PYELOGRAM (Bilateral) - 36 MINS as a surgical intervention.  The patient's history has been reviewed, patient examined, no change in status, stable for surgery.  I have reviewed the patient's chart and labs.  Questions were answered to the patient's satisfaction.     Marton Redwood, III

## 2022-09-17 NOTE — Op Note (Signed)
Operative Note  Preoperative diagnosis:  1.  Chronic cystitis  Postoperative diagnosis: 1.  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  Urethral dilation 2.  Cystoscopy with bilateral retrograde pyelogram 3.  Hydrodistention 4.  Bladder biopsy and fulguration 5.  Instillation of Marcaine/Pyridium  Surgeon: Link Snuffer, MD  Assistants: None  Anesthesia: General  Complications: None immediate  EBL: Minimal  Specimens: 1.  Bladder biopsy x 2  Drains/Catheters: 1.  None  Intraoperative findings: 1.  Patient had some meatal stenosis that was sequentially dilated up to 28 [place].  Otherwise urethra was normal without masses or diverticulum. 2.  Bladder mucosa smooth without trabeculation.  There were no tumors or stones.  She had hypervascularity consistent with radiation cystitis. 3.  Bilateral retrograde pyelogram without any filling defect or hydronephrosis.  Indication: 47 year old female with a history of chronic cystitis.  She has intermittent flares of cystitis.  Some of these have been culture positive whereas others are culture negative.  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  She presents for the previously mentioned operation.  Description of procedure:  The patient was identified and consent was obtained.  The patient was taken to the operating room and placed in the supine position.  The patient was placed under general anesthesia.  Perioperative antibiotics were administered.  The patient was placed in dorsal lithotomy.  Patient was prepped and draped in a standard sterile fashion and a timeout was performed.  The meatus was too tight for the scope.  Therefore, I sequentially dilated the urethra with sounds from 12 [place] up to 28 [place].  The 21 French rigid cystoscope then advanced easily into the bladder.  Complete cystoscopy was performed with findings noted above.  The left ureteral orifice was intubated with an  open-ended ureteral catheter and a retrograde pyelogram was performed with no abnormal findings.  Same was performed on the right again with no abnormal findings.  I then maximally distended the bladder.  First distention revealed a bladder capacity of approximately 850 cc.  I maximally distended the bladder a second time and kept it distended for approximately 5 minutes.  Second distention revealed a bladder capacity of approximately 950 cc.  There were moderate glomerulations.  I performed a bladder biopsy x 2 of the posterior bladder wall.  Bugbee was used for fulguration.  There was no active bleeding noted there was no perforation noted.  I withdrew the scope and placed a Foley catheter and instilled a mixture of Pyridium/Marcaine.  The catheter was withdrawn.  This concluded the operation.  Patient tolerated the procedure well was stable postoperative.  Plan: Follow-up in a few weeks for symptom review.  Cystoscopy and symptoms consistent with a component of radiation cystitis.  She will likely benefit from hyperbaric oxygen referral.

## 2022-09-17 NOTE — Transfer of Care (Signed)
Immediate Anesthesia Transfer of Care Note  Patient: Zoe Briggs  Procedure(s) Performed: CYSTOSCOPY/BIOPSY/HYDRODISTENSION FULGURATION BILATERAL RETROGRADE PYELOGRAM (Bilateral: Urethra)  Patient Location: PACU  Anesthesia Type:General  Level of Consciousness: awake, alert , oriented, and patient cooperative  Airway & Oxygen Therapy: Patient Spontanous Breathing  Post-op Assessment: Report given to RN and Post -op Vital signs reviewed and stable  Post vital signs: Reviewed and stable  Last Vitals:  Vitals Value Taken Time  BP 143/97 09/17/22 1029  Temp    Pulse 77 09/17/22 1030  Resp 23 09/17/22 1030  SpO2 100 % 09/17/22 1030  Vitals shown include unvalidated device data.  Last Pain:  Vitals:   09/17/22 0811  TempSrc: Oral      Patients Stated Pain Goal: 4 (25/67/20 9198)  Complications: No notable events documented.

## 2022-09-18 ENCOUNTER — Encounter (HOSPITAL_BASED_OUTPATIENT_CLINIC_OR_DEPARTMENT_OTHER): Payer: Self-pay | Admitting: Urology

## 2022-09-18 LAB — SURGICAL PATHOLOGY

## 2022-09-26 DIAGNOSIS — M25571 Pain in right ankle and joints of right foot: Secondary | ICD-10-CM | POA: Diagnosis not present

## 2022-09-28 DIAGNOSIS — M25561 Pain in right knee: Secondary | ICD-10-CM | POA: Diagnosis not present

## 2022-09-28 DIAGNOSIS — M6281 Muscle weakness (generalized): Secondary | ICD-10-CM | POA: Diagnosis not present

## 2022-09-28 DIAGNOSIS — M25551 Pain in right hip: Secondary | ICD-10-CM | POA: Diagnosis not present

## 2022-09-28 DIAGNOSIS — M25571 Pain in right ankle and joints of right foot: Secondary | ICD-10-CM | POA: Diagnosis not present

## 2022-10-03 DIAGNOSIS — M25551 Pain in right hip: Secondary | ICD-10-CM | POA: Diagnosis not present

## 2022-10-03 DIAGNOSIS — M6281 Muscle weakness (generalized): Secondary | ICD-10-CM | POA: Diagnosis not present

## 2022-10-03 DIAGNOSIS — M25561 Pain in right knee: Secondary | ICD-10-CM | POA: Diagnosis not present

## 2022-10-05 ENCOUNTER — Encounter (HOSPITAL_BASED_OUTPATIENT_CLINIC_OR_DEPARTMENT_OTHER): Payer: BC Managed Care – PPO | Attending: General Surgery | Admitting: General Surgery

## 2022-10-05 DIAGNOSIS — J45909 Unspecified asthma, uncomplicated: Secondary | ICD-10-CM | POA: Diagnosis not present

## 2022-10-05 DIAGNOSIS — Z9221 Personal history of antineoplastic chemotherapy: Secondary | ICD-10-CM | POA: Insufficient documentation

## 2022-10-05 DIAGNOSIS — Z933 Colostomy status: Secondary | ICD-10-CM | POA: Diagnosis not present

## 2022-10-05 DIAGNOSIS — Z85048 Personal history of other malignant neoplasm of rectum, rectosigmoid junction, and anus: Secondary | ICD-10-CM | POA: Diagnosis not present

## 2022-10-05 DIAGNOSIS — F32A Depression, unspecified: Secondary | ICD-10-CM | POA: Diagnosis not present

## 2022-10-05 DIAGNOSIS — Z87440 Personal history of urinary (tract) infections: Secondary | ICD-10-CM | POA: Diagnosis not present

## 2022-10-05 DIAGNOSIS — M25561 Pain in right knee: Secondary | ICD-10-CM | POA: Diagnosis not present

## 2022-10-05 DIAGNOSIS — F419 Anxiety disorder, unspecified: Secondary | ICD-10-CM | POA: Diagnosis not present

## 2022-10-05 DIAGNOSIS — L598 Other specified disorders of the skin and subcutaneous tissue related to radiation: Secondary | ICD-10-CM | POA: Insufficient documentation

## 2022-10-05 DIAGNOSIS — N3041 Irradiation cystitis with hematuria: Secondary | ICD-10-CM | POA: Diagnosis not present

## 2022-10-05 DIAGNOSIS — N3040 Irradiation cystitis without hematuria: Secondary | ICD-10-CM | POA: Diagnosis not present

## 2022-10-05 DIAGNOSIS — Z923 Personal history of irradiation: Secondary | ICD-10-CM | POA: Insufficient documentation

## 2022-10-05 DIAGNOSIS — K219 Gastro-esophageal reflux disease without esophagitis: Secondary | ICD-10-CM | POA: Insufficient documentation

## 2022-10-05 DIAGNOSIS — M6281 Muscle weakness (generalized): Secondary | ICD-10-CM | POA: Diagnosis not present

## 2022-10-05 DIAGNOSIS — M25551 Pain in right hip: Secondary | ICD-10-CM | POA: Diagnosis not present

## 2022-10-05 NOTE — Progress Notes (Addendum)
Zoe Briggs ([id number]) [id number] Page 1 of 6 Visit Report for 10/05/2022 Allergy List Details Patient Name: Date of Service: Zoe Briggs, [place] 10/05/2022 1:15 PM Medical Record Number: [id number] Patient Account Number: [id number] Date of Birth/Sex: Treating RN: [birth year] (47 y.o. Harlow Ohms Primary Care Anmol Paschen: Melissa Montane Other Clinician: Referring Stormi Vandevelde: Treating Mae Denunzio/Extender: Andrez Grime Weeks in Treatment: 0 Allergies Active Allergies ciprofloxacin Sulfa (Sulfonamide Antibiotics) Imitrex Iodinated Contrast Media Allergy Notes Electronic Signature(s) Signed: 10/05/2022 3:17:02 PM By: Adline Peals Entered By: Adline Peals on 10/05/2022 08:01:41 -------------------------------------------------------------------------------- Arrival Information Details Patient Name: Date of Service: Zoe Briggs 10/05/2022 1:15 PM Medical Record Number: [id number] Patient Account Number: [id number] Date of Birth/Sex: Treating RN: Nov 06, 1975 (47 y.o. Harlow Ohms Primary Care Orton Capell: Melissa Montane Other Clinician: Referring Kalleigh Harbor: Treating Tiffannie Sloss/Extender: Sherril Cong in Treatment: 0 Visit Information Patient Arrived: Ambulatory Arrival Time: 13:30 Accompanied By: self Transfer Assistance: None Patient Identification Verified: Yes Secondary Verification Process Completed: Yes Electronic Signature(s) Signed: 10/05/2022 3:17:02 PM By: Adline Peals Entered By: Adline Peals on 10/05/2022 13:30:16 Micheline Rough ([id number]) [id number] Page 2 of 6 -------------------------------------------------------------------------------- Clinic Level of Care Assessment Details Patient Name: Date of Service: Zoe Briggs 10/05/2022 1:15 PM Medical Record Number: [id number] Patient Account Number: [id number] Date of  Birth/Sex: Treating RN: [birth year] (47 y.o. Harlow Ohms Primary Care Jahni Nazar: Melissa Montane Other Clinician: Referring Kayren Holck: Treating Ciclaly Mulcahey/Extender: Sherril Cong in Treatment: 0 Clinic Level of Care Assessment Items TOOL 2 Quantity Score X- 1 0 Use when only an EandM is performed on the INITIAL visit ASSESSMENTS - Nursing Assessment / Reassessment X- 1 20 General Physical Exam (combine w/ comprehensive assessment (listed just below) when performed on new pt. evals) X- 1 25 Comprehensive Assessment (HX, ROS, Risk Assessments, Wounds Hx, etc.) ASSESSMENTS - Wound and Skin A ssessment / Reassessment '[]'$  - 0 Simple Wound Assessment / Reassessment - one wound '[]'$  - 0 Complex Wound Assessment / Reassessment - multiple wounds '[]'$  - 0 Dermatologic / Skin Assessment (not related to wound area) ASSESSMENTS - Ostomy and/or Continence Assessment and Care '[]'$  - 0 Incontinence Assessment and Management '[]'$  - 0 Ostomy Care Assessment and Management (repouching, etc.) PROCESS - Coordination of Care X - Simple Patient / Family Education for ongoing care 1 15 '[]'$  - 0 Complex (extensive) Patient / Family Education for ongoing care X- 1 10 Staff obtains Programmer, systems, Records, T Results / Process Orders est '[]'$  - 0 Staff telephones HHA, Nursing Homes / Clarify orders / etc '[]'$  - 0 Routine Transfer to another Facility (non-emergent condition) '[]'$  - 0 Routine Hospital Admission (non-emergent condition) X- 1 15 New Admissions / Biomedical engineer / Ordering NPWT Apligraf, etc. , '[]'$  - 0 Emergency Hospital Admission (emergent condition) '[]'$  - 0 Simple Discharge Coordination '[]'$  - 0 Complex (extensive) Discharge Coordination PROCESS - Special Needs '[]'$  - 0 Pediatric / Minor Patient Management '[]'$  - 0 Isolation Patient Management '[]'$  - 0 Hearing / Language / Visual special needs '[]'$  - 0 Assessment of Community assistance (transportation, D/C planning,  etc.) '[]'$  - 0 Additional assistance / Altered mentation '[]'$  - 0 Support Surface(s) Assessment (bed, cushion, seat, etc.) INTERVENTIONS - Wound Cleansing / Measurement '[]'$  - 0 Wound Imaging (photographs - any number of wounds) '[]'$  - 0 Wound Tracing (instead of photographs) '[]'$  - 0 Simple Wound Measurement - one wound '[]'$  - 0 Complex Wound Measurement - multiple wounds '[]'$  - 0 Simple Wound Cleansing -  one wound []  - 0 Complex Wound Cleansing - multiple wounds INTERVENTIONS - Wound Dressings []  - 0 Small Wound Dressing one or multiple wounds Blish, Sherrin ([id number]) [id number] Page 3 of 6 []  - 0 Medium Wound Dressing one or multiple wounds []  - 0 Large Wound Dressing one or multiple wounds []  - 0 Application of Medications - injection INTERVENTIONS - Miscellaneous []  - 0 External ear exam []  - 0 Specimen Collection (cultures, biopsies, blood, body fluids, etc.) []  - 0 Specimen(s) / Culture(s) sent or taken to Lab for analysis []  - 0 Patient Transfer (multiple staff / Nurse, adult / Similar devices) []  - 0 Simple Staple / Suture removal (25 or less) []  - 0 Complex Staple / Suture removal (26 or more) []  - 0 Hypo / Hyperglycemic Management (close monitor of Blood Glucose) []  - 0 Ankle / Brachial Index (ABI) - do not check if billed separately Has the patient been seen at the hospital within the last three years: Yes Total Score: 85 Level Of Care: New/Established - Level 3 Electronic Signature(s) Signed: 10/05/2022 3:17:02 PM By: Samuella Bruin Entered By: Samuella Bruin on 10/05/2022 13:52:09 -------------------------------------------------------------------------------- Encounter Discharge Information Details Patient Name: Date of Service: Zoe Briggs 10/05/2022 1:15 PM Medical Record Number: [id number] Patient Account Number: [id number] Date of Birth/Sex: Treating RN: 03/28/1976 (47 y.o. Fredderick Phenix Primary Care  Elayne Gruver: Abran Duke Other Clinician: Referring Tahra Hitzeman: Treating Kinga Cassar/Extender: Emeline Gins in Treatment: 0 Encounter Discharge Information Items Discharge Condition: Stable Ambulatory Status: Ambulatory Discharge Destination: Home Transportation: Private Auto Accompanied By: self Schedule Follow-up Appointment: Yes Clinical Summary of Care: Patient Declined Electronic Signature(s) Signed: 10/05/2022 3:17:02 PM By: Samuella Bruin Entered By: Samuella Bruin on 10/05/2022 14:27:52 -------------------------------------------------------------------------------- Lower Extremity Assessment Details Patient Name: Date of Service: [facility] [place], [place] Briggs 10/05/2022 1:15 PM Medical Record Number: [id number] Patient Account Number: [id number] Date of Birth/Sex: Treating RN: 18-Oct-1975 (47 y.o. Fredderick Phenix Primary Care Shamecka Hocutt: Abran Duke Other Clinician: Referring Joan Herschberger: Treating Kiffany Schelling/Extender: Emeline Gins in Treatment: 0 [place], Junious Dresser ([id number]) [id number] Page 4 of 6 Electronic Signature(s) Signed: 10/05/2022 3:17:02 PM By: Samuella Bruin Entered By: Samuella Bruin on 10/05/2022 13:38:26 -------------------------------------------------------------------------------- Multi Wound Chart Details Patient Name: Date of Service: [facility] [place], [place] Briggs 10/05/2022 1:15 PM Medical Record Number: [id number] Patient Account Number: [id number] Date of Birth/Sex: Treating RN: 1976-02-10 (47 y.o. F) Primary Care Tamella Tuccillo: Abran Duke Other Clinician: Referring Kimbly Eanes: Treating Lydiann Bonifas/Extender: Emeline Gins in Treatment: 0 Vital Signs Height(in): 70 Pulse(bpm): 93 Weight(lbs): 160 Blood Pressure(mmHg): 121/85 Body Mass Index(BMI): 23 Temperature(F): 98.7 Respiratory Rate(breaths/min): 18 [Treatment Notes:Wound Assessments Treatment  Notes] Electronic Signature(s) Signed: 10/05/2022 2:17:56 PM By: Duanne Guess MD FACS Entered By: Duanne Guess on 10/05/2022 14:17:56 -------------------------------------------------------------------------------- Multi-Disciplinary Care Plan Details Patient Name: Date of Service: [facility] [place], [place] Briggs 10/05/2022 1:15 PM Medical Record Number: [id number] Patient Account Number: [id number] Date of Birth/Sex: Treating RN: 24-Sep-1975 (47 y.o. Fredderick Phenix Primary Care [place] Hair: Abran Duke Other Clinician: Referring Trisa Cranor: Treating Yoltzin Barg/Extender: Alfonzo Feller Weeks in Treatment: 0 Active Inactive Electronic Signature(s) Signed: 03/12/2023 3:03:48 PM By: Samuella Bruin Previous Signature: 10/05/2022 3:17:02 PM Version By: Samuella Bruin Entered By: Samuella Bruin on 10/22/2022 16:31:41 Pain Assessment Details -------------------------------------------------------------------------------- Gilda Crease ([id number]) [id number] Page 5 of 6 Patient Name: Date of Service: [facility], [place] Briggs 10/05/2022 1:15 PM Medical Record Number: [id number] Patient Account Number: [id number] Date of Birth/Sex: Treating RN: 1976/06/19 (46 y.o.  Fredderick Phenix Primary Care Natalia Wittmeyer: Abran Duke Other Clinician: Referring Shayleigh Bouldin: Treating Cambria Osten/Extender: Alfonzo Feller Weeks in Treatment: 0 Active Problems Location of Pain Severity and Description of Pain Patient Has Paino No Site Locations Rate the pain. Current Pain Level: 0 Pain Management and Medication Current Pain Management: Electronic Signature(s) Signed: 10/05/2022 3:17:02 PM By: Samuella Bruin Entered By: Samuella Bruin on 10/05/2022 13:38:39 -------------------------------------------------------------------------------- Patient/Caregiver Education Details Patient Name: Date of Service: [facility] [place], [place] Briggs  1/26/2024andnbsp1:15 PM Medical Record Number: [id number] Patient Account Number: [id number] Date of Birth/Gender: Treating RN: 02/04/1976 (47 y.o. Fredderick Phenix Primary Care Physician: Abran Duke Other Clinician: Referring Physician: Treating Physician/Extender: Emeline Gins in Treatment: 0 Education Assessment Education Provided To: Patient Education Topics Provided Hyperbaric Oxygenation: Methods: Explain/Verbal Responses: Reinforcements needed, State content correctly Electronic Signature(s) Signed: 10/05/2022 3:17:02 PM By: Samuella Bruin Entered By: Samuella Bruin on 10/05/2022 13:51:23 Gilda Crease ([id number]) [id number] Page 6 of 6 -------------------------------------------------------------------------------- Vitals Details Patient Name: Date of Service: [facility], [place] Briggs 10/05/2022 1:15 PM Medical Record Number: [id number] Patient Account Number: [id number] Date of Birth/Sex: Treating RN: Sep 16, 1975 (47 y.o. Roselee Nova, Jamie Primary Care Laird Runnion: Abran Duke Other Clinician: Referring Briseidy Spark: Treating Anneliese Leblond/Extender: Alfonzo Feller Weeks in Treatment: 0 Vital Signs Time Taken: 13:27 Temperature (F): 98.7 Height (in): 70 Pulse (bpm): 93 Source: Stated Respiratory Rate (breaths/min): 18 Weight (lbs): 160 Blood Pressure (mmHg): 121/85 Source: Stated Reference Range: 80 - 120 mg / dl Body Mass Index (BMI): 23 Electronic Signature(s) Signed: 10/05/2022 3:30:08 PM By: Tommie Ard RN Entered By: Tommie Ard on 10/05/2022 13:28:46

## 2022-10-05 NOTE — Progress Notes (Signed)
Zoe Briggs, Zoe Briggs ([id number]) [id number] Page 1 of 7 Visit Report for 10/05/2022 Chief Complaint Document Details Patient Name: Date of Service: [facility], [place] 10/05/2022 1:15 PM Medical Record Number: [id number] Patient Account Number: [id number] Date of Birth/Sex: Treating RN: 03/09/1976 (47 y.o. 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 F) Primary Care Provider: Melissa Montane Other Clinician: Referring Provider: Treating Provider/Extender: Sherril Cong in Treatment: 0 History of Present Illness HPI Description: ADMISSION 10/05/2022 This is a 47 year old woman who was diagnosed with rectal cancer in her late 84s. She underwent neoadjuvant chemotherapy and radiation before proceeding to a low anterior resection with permanent colostomy. All of her treatment was done in [place] in 2005. Unfortunately, due to the time that has elapsed, those records have since been purged and we are unable to access them to determine the exact amount of radiation that she received. She reports that shortly after completing her treatment, she began developing issues with what she believed were recurrent urinary tract infections. She was diagnosed with interstitial cystitis and  recurrent UTIs, but on a number of occasions, she would have bladder pain but sterile urine. She has undergone multiple treatments with hydrodistention and Elmiron therapy. She has had microscopic hematuria, but never frank clotting or gross hematuria. 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 Biopsy was consistent with chronic inflammation. Due to the cystoscopy findings and her chronic pain, her urologist recommended hyperbaric oxygen therapy to hopefully alleviate her symptoms. Electronic Signature(s) Signed: 10/05/2022 2:24:26 PM By: Fredirick Maudlin MD FACS Entered By: Fredirick Maudlin on 10/05/2022 14:24:26 -------------------------------------------------------------------------------- Physical Exam Details Patient Name: Date of Service: [facility], Zoe 10/05/2022 1:15 PM [name], Briggs ([id number]) [id number] Page 2 of 7 Medical Record Number: [id number] Patient Account Number: [id number] Date of Birth/Sex: Treating RN: Apr 07, 1976 (47 y.o. F) Primary Care Provider: Melissa Montane Other Clinician: Referring Provider: Treating Provider/Extender: Andrez Grime Weeks in Treatment: 0 Constitutional . . . . No acute distress. Ears, Nose, Mouth, and Throat . Respiratory . Clear to auscultation bilaterally. Cardiovascular . Electronic Signature(s) Signed: 10/05/2022 2:25:31 PM By: Fredirick Maudlin MD FACS Entered By: Fredirick Maudlin on 10/05/2022 14:25:30 -------------------------------------------------------------------------------- Physician Orders Details Patient Name: Date of Service: [place], [place]oe [place] 10/05/2022 1:15 PM Medical Record Number: [id number] Patient Account Number: [id number] Date of Birth/Sex: Treating RN: Oct 21, 1975 (47 y.o. Harlow Briggs Primary Care Provider: Melissa Montane Other Clinician: Referring Provider: Treating  Provider/Extender: Sherril Cong in Treatment: 0 Verbal / Phone Orders: No Diagnosis Coding ICD-10 Coding Code Description L59.8 Other specified disorders of the skin and subcutaneous tissue related to radiation Z92.3 Personal history of irradiation Follow-up Appointments Return appointment in 1 month. - HBO evaluation Hyperbaric Oxygen Therapy Indication: - radiation cystitis If appropriate for treatment, begin HBOT per protocol: 2.0 ATA for 90 Minutes without A Breaks ir Total Number of Treatments: - 40 One treatments per day (delivered Monday through Friday unless otherwise specified in  Special Instructions below): A frin (Oxymetazoline HCL) 0.05% nasal spray - 1 spray in both nostrils daily as needed prior to HBO treatment for difficulty clearing ears Electronic Signature(s) Signed: 10/05/2022 2:25:45 PM By: Fredirick Maudlin MD FACS Entered By: Fredirick Maudlin on 10/05/2022 14:25:45 -------------------------------------------------------------------------------- Problem List Details Patient Name: Date of Service: [place], [place]oe [place] 10/05/2022 1:15 PM Medical Record Number: [id number] Patient Account Number: [id number] [name], Briggs ([id number]) [phone].pdf Page 3 of 7 Date of Birth/Sex: Treating RN: July 12, 1976 (47 y.o. F) Primary Care Provider: Other Clinician: Melissa Montane Referring Provider: Treating Provider/Extender: Sherril Cong in Treatment: 0 Active Problems ICD-10 Encounter Code Description Active Date MDM Diagnosis L59.8 Other specified disorders of the skin and subcutaneous tissue related to 10/05/2022 No Yes radiation Z92.3 Personal history of irradiation 10/05/2022 No Yes Inactive Problems Resolved Problems Electronic Signature(s) Signed: 10/05/2022 1:55:34 PM By: Fredirick Maudlin MD FACS Entered By: Fredirick Maudlin on 10/05/2022  13:55:34 -------------------------------------------------------------------------------- Progress Note Details Patient Name: Date of Service: [place] Briggs, Zoe 10/05/2022 1:15 PM Medical Record Number: [id number] Patient Account Number: [id number] Date of Birth/Sex: Treating RN: 10-06-1975 (47 y.o. 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 She underwent neoadjuvant chemotherapy and radiation before proceeding to a low anterior resection with permanent colostomy. All of her treatment was done in [place] in 2005. Unfortunately, due to the time that has elapsed, those records have since been purged and we are unable to access them to determine the exact amount of radiation that she received. She reports that shortly after completing her treatment, she began developing issues with what she believed were recurrent urinary tract infections. She was diagnosed with interstitial cystitis and recurrent UTIs, but on a number of occasions, she would have bladder pain but sterile urine. She has undergone multiple treatments with hydrodistention and Elmiron therapy. She has had microscopic hematuria, but never frank clotting or gross hematuria. 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 Biopsy was consistent with chronic inflammation. Due to the cystoscopy findings and her chronic pain, her urologist recommended hyperbaric oxygen therapy to hopefully alleviate her symptoms. Patient History Information obtained from  Patient. Allergies ciprofloxacin, Sulfa (Sulfonamide Antibiotics), Imitrex, Iodinated Contrast Media Family History Heart Disease - Mother, Hypertension - Mother, No family history of Cancer, Diabetes, Hereditary Spherocytosis, Kidney Disease, Lung Disease, Seizures, Stroke, Thyroid Problems, Tuberculosis. Social History Never smoker, Marital Status - Married, Alcohol Use - Never, Drug Use - No History, Caffeine Use - Daily. 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 - ERCP. - upper GI endoscopy. - foot surgery. - colon resection surgery. - colonoscopy. - tonsillectomy and adenoidectomy. Medical A Surgical History Notes nd Ear/Nose/Mouth/Throat vertigo Hematologic/Lymphatic low WBC Gastrointestinal GERD Genitourinary interstitial cystitis bladder lesion, radiation cystitis, recurrent UTIs Musculoskeletal foot fracture, sciatica Neurologic chronic migraines Oncologic rectal cancer Psychiatric depression, anxiety, ADD Review of Systems (ROS) Constitutional Symptoms (General Health) Denies  complaints or symptoms of Fatigue, Fever, Chills, Marked Weight Change. Eyes Complains or has symptoms of Glasses / Contacts. Ear/Nose/Mouth/Throat Denies complaints or symptoms of Chronic sinus problems or rhinitis. Cardiovascular Denies complaints or symptoms of Chest pain. Gastrointestinal colostomy bag Endocrine Denies complaints or symptoms of Heat/cold intolerance. Integumentary (Skin) Denies complaints or symptoms of Wounds. Neurologic Denies complaints or symptoms of Numbness/parasthesias. Psychiatric Denies complaints or  symptoms of Claustrophobia. Objective Constitutional No acute distress. Vitals Time Taken: 1:27 PM, Height: 70 in, Source: Stated, Weight: 160 lbs, Source: Stated, BMI: 23, Temperature: 98.7 F, Pulse: 93 bpm, Respiratory Rate: 18 breaths/min, Blood Pressure: 121/85 mmHg. Respiratory Clear to auscultation bilaterally. Assessment Active Problems ICD-10 Other specified disorders of the skin and subcutaneous tissue related to radiation Personal history of irradiation Plan AUBRIAUNA, RINER ([id number]) [id number] Page 5 of 7 Follow-up Appointments: Return appointment in 1 month. - HBO evaluation Hyperbaric Oxygen Therapy: Indication: - radiation cystitis If appropriate for treatment, begin HBOT per protocol: 2.0 ATA for 90 Minutes without Air Breaks T Number of Treatments: - 40 otal One treatments per day (delivered Monday through Friday unless otherwise specified in Special Instructions below): Afrin (Oxymetazoline HCL) 0.05% nasal spray - 1 spray in both nostrils daily as needed prior to HBO treatment for difficulty clearing ears 10/05/2022: This is a 47 year old woman who underwent radiation therapy for rectal cancer in 2005. Due to the time elapsed since her treatment, the records detailing the amount of radiation she received have been purged. Nonetheless, she began having bladder issues after receiving this treatment. She recently underwent cystoscopy and was diagnosed with radiation cystitis. It was believed that she would benefit from hyperbaric oxygen therapy to alleviate her significant pain that interferes quite markedly with her quality of life. I have recommended that she undergo 40 treatments at 2.0 atm with no air breaks. She is not diabetic and therefore the glycemic protocol does not apply to her. We will work on Careers adviser) Signed: 10/05/2022 2:27:58 PM By: Fredirick Maudlin MD FACS Entered By: Fredirick Maudlin on 10/05/2022 14:27:57 -------------------------------------------------------------------------------- HxROS Details Patient Name: Date of Service: [place], [place] 10/05/2022 1:15 PM Medical Record Number: [id number] Patient Account Number: [id number] Date of Birth/Sex: Treating RN: 09/30/1975 (47 y.o. Harlow Briggs Primary Care Provider: Melissa Montane Other Clinician: Referring Provider: Treating Provider/Extender: Andrez Grime Weeks in Treatment: 0 Information Obtained From Patient Constitutional Symptoms (General Health) Complaints and Symptoms: Negative for: Fatigue; Fever; Chills; Marked Weight Change Eyes Complaints and Symptoms: Positive for: Glasses / Contacts Ear/Nose/Mouth/Throat Complaints and Symptoms: Negative for: Chronic sinus problems or rhinitis Medical History: Past Medical History Notes: vertigo Cardiovascular Complaints and Symptoms: Negative for: Chest pain Endocrine Complaints and Symptoms: Negative for: Heat/cold intolerance Integumentary (Skin) Complaints and Symptoms: Negative for: Wounds Aailyah, Dunbar Livvy ([id number]) [id number] Page 6 of 7 Neurologic Complaints and Symptoms: Negative for: Numbness/parasthesias Medical History: Positive for: 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 Negative for: Seizure Disorder Past Medical History Notes: chronic migraines Psychiatric Complaints and Symptoms: Negative for: Claustrophobia Medical History: Past Medical History Notes: depression, anxiety, ADD Hematologic/Lymphatic Medical History: Past Medical History Notes: low WBC Respiratory Medical History: Positive for: Asthma Gastrointestinal Complaints and Symptoms: Review of System Notes: colostomy bag Medical History: Past Medical History Notes: GERD Genitourinary Medical History: Past Medical History Notes: interstitial cystitis bladder lesion, radiation cystitis, recurrent  UTIs Immunological Musculoskeletal Medical History: Past Medical History Notes: foot fracture, sciatica Oncologic Medical History: Positive for: Received Chemotherapy; Received Radiation Past Medical  History Notes: rectal cancer Immunizations Pneumococcal Vaccine: Received Pneumococcal Vaccination: No Implantable Devices None Hospitalization / Surgery History Type of Hospitalization/Surgery cystoscopy with hydrodistension and biopsy ERCP upper GI endoscopy foot surgery colon resection surgery colonoscopy tonsillectomy and adenoidectomy Heppler, Normagene ([id number]) [id number] Page 7 of 7 Family and Social History Cancer: No; Diabetes: No; Heart Disease: Yes - Mother; Hereditary Spherocytosis: No; Hypertension: Yes - Mother; Kidney Disease: No; Lung Disease: No; Seizures: No; Stroke: No; Thyroid Problems: No; Tuberculosis: No; Never smoker; Marital Status - Married; Alcohol Use: Never; Drug Use: No History; Caffeine Use: Daily; Financial Concerns: No; Food, Clothing or Shelter Needs: No; Support System Lacking: No; Transportation Concerns: No Electronic Signature(s) Signed: 10/05/2022 3:17:02 PM By: Adline Peals Signed: 10/05/2022 3:31:20 PM By: Fredirick Maudlin MD FACS Previous Signature: 10/05/2022 12:23:39 PM Version By: Fredirick Maudlin MD FACS Entered By: Adline Peals on 10/05/2022 13:48:24 -------------------------------------------------------------------------------- SuperBill Details Patient Name: Date of Service: [place], [place] 10/05/2022 Medical Record Number: [id number] Patient Account Number: [id number] Date of Birth/Sex: Treating RN: 06/20/1976 (47 y.o. Harlow Briggs Primary Care Provider: Melissa Montane Other Clinician: Referring Provider: Treating Provider/Extender: Andrez Grime Weeks in Treatment: 0 Diagnosis Coding ICD-10 Codes Code Description L59.8 Other specified disorders of the  skin and subcutaneous tissue related to radiation Z92.3 Personal history of irradiation Facility Procedures : CPT4 Code: [id number] Description: [facility] VISIT-LEV 3 EST PT Modifier: Quantity: 1 Physician Procedures : CPT4 Code Description Modifier [id number] [place] PHYS LEVEL 3 NEW PT ICD-10 Diagnosis Description L59.8 Other specified disorders of the skin and subcutaneous tissue related to radiation Z92.3 Personal history of irradiation Quantity: 1 Electronic Signature(s) Signed: 10/05/2022 2:28:09 PM By: Fredirick Maudlin MD FACS Entered By: Fredirick Maudlin on 10/05/2022 14:28:09

## 2022-10-05 NOTE — Progress Notes (Signed)
Zoe Briggs ([id number]) [id number] Page 1 of 4 Visit Report for 10/05/2022 Abuse Risk Screen Details Patient Name: Date of Service: Zoe Briggs 10/05/2022 1:15 PM Medical Record Number: [id number] Patient Account Number: [id number] Date of Birth/Sex: Treating RN: 05-14-1976 (47 y.o. Zoe Briggs Primary Care Vita Currin: Melissa Montane Other Clinician: Referring Auda Finfrock: Treating Sheronda Parran/Extender: Andrez Grime Weeks in Treatment: 0 Abuse Risk Screen Items Answer ABUSE RISK SCREEN: Has anyone close to you tried to hurt or harm you recentlyo No Do you feel uncomfortable with anyone in your familyo No Has anyone forced you do things that you didnt want to doo No Electronic Signature(s) Signed: 10/05/2022 3:17:02 PM By: Adline Peals Entered By: Adline Peals on 10/05/2022 13:36:47 -------------------------------------------------------------------------------- Activities of Daily Living Details Patient Name: Date of Service: Zoe Briggs 10/05/2022 1:15 PM Medical Record Number: [id number] Patient Account Number: [id number] Date of Birth/Sex: Treating RN: [birth year] (47 y.o. Zoe Briggs Primary Care Tyus Kallam: Melissa Montane Other Clinician: Referring Chaneka Trefz: Treating Monty Spicher/Extender: Andrez Grime Weeks in Treatment: 0 Activities of Daily Living Items Answer Activities of Daily Living (Please select one for each item) Drive Automobile Completely Able T Medications ake Completely Able Use T elephone Completely Able Care for Appearance Completely Able Use T oilet Completely Able Briggs / Shower Completely Able Dress Self Completely Able Feed Self Completely Able Walk Completely Able Get In / Out Bed Completely Able Housework Completely Able Prepare Meals Completely Able Handle Money Completely Able Shop for Self Completely Able Electronic  Signature(s) Signed: 10/05/2022 3:17:02 PM By: Adline Peals Entered By: Adline Peals on 10/05/2022 13:37:01 Zoe Briggs ([id number]) [id number] Page 2 of 4 -------------------------------------------------------------------------------- Education Screening Details Patient Name: Date of Service: Zoe Briggs 10/05/2022 1:15 PM Medical Record Number: [id number] Patient Account Number: [id number] Date of Birth/Sex: Treating RN: Nov 27, 1975 (47 y.o. Zoe Briggs Primary Care Mehlani Blankenburg: Melissa Montane Other Clinician: Referring Kaitlynne Wenz: Treating Antwann Preziosi/Extender: Sherril Cong in Treatment: 0 Primary Learner Assessed: Patient Learning Preferences/Education Level/Primary Language Learning Preference: Explanation, Demonstration, Video, Printed Material Highest Education Level: College or Above Preferred Language: English Cognitive Barrier Language Barrier: No Translator Needed: No Memory Deficit: No Emotional Barrier: No Cultural/Religious Beliefs Affecting Medical Care: No Physical Barrier Impaired Vision: Yes Glasses Impaired Hearing: No Decreased Hand dexterity: No Knowledge/Comprehension Knowledge Level: Medium Comprehension Level: Medium Ability to understand written instructions: Medium Ability to understand verbal instructions: Medium Motivation Anxiety Level: Calm Cooperation: Cooperative Education Importance: Acknowledges Need Interest in Health Problems: Asks Questions Perception: Coherent Willingness to Engage in Self-Management Medium Activities: Readiness to Engage in Self-Management Medium Activities: Electronic Signature(s) Signed: 10/05/2022 3:17:02 PM By: Adline Peals Entered By: Adline Peals on 10/05/2022 13:37:17 -------------------------------------------------------------------------------- Fall Risk Assessment Details Patient Name: Date of  Service: Zoe Briggs 10/05/2022 1:15 PM Medical Record Number: [id number] Patient Account Number: [id number] Date of Birth/Sex: Treating RN: [birth year] (47 y.o. Zoe Briggs Primary Care Pascuala Klutts: Melissa Montane Other Clinician: Referring Akyah Lagrange: Treating Cliff Damiani/Extender: Andrez Grime Weeks in Treatment: 0 Fall Risk Assessment Items Have you had 2 or more falls in the last 12 monthso 0 No Zoe Briggs, Zoe Briggs ([id number]) [phone] [id number] Page 3 of 4 Have you had any fall that resulted in injury in the last 12 monthso 0 No FALLS RISK SCREEN History of falling - immediate or within 3 months 0 No Secondary diagnosis (Do you have 2 or more medical diagnoseso) 0 No Ambulatory aid None/bed rest/wheelchair/nurse 0  Yes Crutches/cane/walker 0 No Furniture 0 No Intravenous therapy Access/Saline/Heparin Lock 0 No Gait/Transferring Normal/ bed rest/ wheelchair 0 Yes Weak (short steps with or without shuffle, stooped but able to lift head while walking, may seek 0 No support from furniture) Impaired (short steps with shuffle, may have difficulty arising from chair, head down, impaired 0 No balance) Mental Status Oriented to own ability 0 Yes Electronic Signature(s) Signed: 10/05/2022 3:17:02 PM By: Adline Peals Entered By: Adline Peals on 10/05/2022 13:37:28 -------------------------------------------------------------------------------- Foot Assessment Details Patient Name: Date of Service: Zoe Briggs 10/05/2022 1:15 PM Medical Record Number: [id number] Patient Account Number: [id number] Date of Birth/Sex: Treating RN: 03/01/1976 (47 y.o. Zoe Briggs Primary Care Dovie Kapusta: Melissa Montane Other Clinician: Referring Jiles Goya: Treating Ilea Hilton/Extender: Andrez Grime Weeks in Treatment: 0 Foot Assessment Items Site Locations + = Sensation present, - = Sensation absent, C  = Callus, U = Ulcer R = Redness, W = Warmth, M = Maceration, PU = Pre-ulcerative lesion F = Fissure, S = Swelling, D = Dryness Assessment Right: Left: Other Deformity: No No Prior Foot Ulcer: No No Prior Amputation: No No Charcot Joint: No No Ambulatory Status: Ambulatory Without Help GaitPaylin Hailu, Briggs ([id number]) [phone].pdf Page 4 of 4 Notes no wounds Electronic Signature(s) Signed: 10/05/2022 3:17:02 PM By: Adline Peals Entered By: Adline Peals on 10/05/2022 13:38:22 -------------------------------------------------------------------------------- Nutrition Risk Screening Details Patient Name: Date of Service: [facility] [place] Briggs 10/05/2022 1:15 PM Medical Record Number: [id number] Patient Account Number: [id number] Date of Birth/Sex: Treating RN: 04/28/1976 (47 y.o. Zoe Briggs Primary Care Candace Ramus: Melissa Montane Other Clinician: Referring Ciria Bernardini: Treating Bradon Fester/Extender: Andrez Grime Weeks in Treatment: 0 Height (in): 70 Weight (lbs): 160 Body Mass Index (BMI): 23 Nutrition Risk Screening Items Score Screening NUTRITION RISK SCREEN: I have an illness or condition that made me change the kind and/or amount of food I eat 0 No I eat fewer than two meals per day 0 No I eat few fruits and vegetables, or milk products 0 No I have three or more drinks of beer, liquor or wine almost every day 0 No I have tooth or mouth problems that make it hard for me to eat 0 No I don't always have enough money to buy the food I need 0 No I eat alone most of the time 0 No I take three or more different prescribed or over-the-counter drugs a day 0 No Without wanting to, I have lost or gained 10 pounds in the last six months 0 No I am not always physically able to shop, cook and/or feed myself 0 No Nutrition Protocols Good Risk Protocol 0 No interventions needed Moderate Risk Protocol High Risk  Proctocol Risk Level: Good Risk Score: 0 Electronic Signature(s) Signed: 10/05/2022 3:17:02 PM By: Adline Peals Entered By: Adline Peals on 10/05/2022 13:37:59

## 2022-10-09 DIAGNOSIS — Z933 Colostomy status: Secondary | ICD-10-CM | POA: Diagnosis not present

## 2022-10-09 DIAGNOSIS — H04123 Dry eye syndrome of bilateral lacrimal glands: Secondary | ICD-10-CM | POA: Diagnosis not present

## 2022-10-09 DIAGNOSIS — Z85038 Personal history of other malignant neoplasm of large intestine: Secondary | ICD-10-CM | POA: Diagnosis not present

## 2022-10-11 DIAGNOSIS — M25551 Pain in right hip: Secondary | ICD-10-CM | POA: Diagnosis not present

## 2022-10-11 DIAGNOSIS — M6281 Muscle weakness (generalized): Secondary | ICD-10-CM | POA: Diagnosis not present

## 2022-10-11 DIAGNOSIS — M25561 Pain in right knee: Secondary | ICD-10-CM | POA: Diagnosis not present

## 2022-10-16 DIAGNOSIS — M25551 Pain in right hip: Secondary | ICD-10-CM | POA: Diagnosis not present

## 2022-10-16 DIAGNOSIS — M6281 Muscle weakness (generalized): Secondary | ICD-10-CM | POA: Diagnosis not present

## 2022-10-16 DIAGNOSIS — M25561 Pain in right knee: Secondary | ICD-10-CM | POA: Diagnosis not present

## 2022-10-17 DIAGNOSIS — Z933 Colostomy status: Secondary | ICD-10-CM | POA: Diagnosis not present

## 2022-10-18 ENCOUNTER — Ambulatory Visit: Payer: BC Managed Care – PPO | Admitting: Podiatry

## 2022-10-18 ENCOUNTER — Ambulatory Visit (INDEPENDENT_AMBULATORY_CARE_PROVIDER_SITE_OTHER): Payer: BC Managed Care – PPO

## 2022-10-18 DIAGNOSIS — M25373 Other instability, unspecified ankle: Secondary | ICD-10-CM

## 2022-10-18 DIAGNOSIS — S93491S Sprain of other ligament of right ankle, sequela: Secondary | ICD-10-CM

## 2022-10-18 DIAGNOSIS — M79671 Pain in right foot: Secondary | ICD-10-CM

## 2022-10-18 NOTE — Progress Notes (Signed)
Subjective:  Patient ID: Zoe Briggs, female    DOB: 1975/11/10,  MRN: [id number]  Chief Complaint  Patient presents with   Foot Pain    Right foot pain and swelling in the ankle lateral side, started a month ago, patient has been having more pain in the last week, patient is wearing a brace and compression sleeve     47 y.o. female presents with concern for pain and swelling in the lateral area of the right ankle.  She says this started approximately 1 to 2 months ago.  She has been having more pain recently.  She has previously seen orthopedics for this who did an injection in the area of pain which she said did help for some time but the pain has recently started to get worse for her.  She does have a history of prior ankle sprains in the past 2 that were quite severe causing her to be nonweightbearing for a time.  She has tried wearing a brace though she does not feel it is totally successful she is also using compression stocking as well.  She is very active and wants to remain active without pain but currently is having difficulty doing so related to this chronic ankle pain on the right side.  Past Medical History:  Diagnosis Date   ADD (attention deficit  disorder)    Follows w/ [facility].   Allergy    Asthma    Seasonal, Follows w/ Melissa Montane, PA.   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 Nails are of normal length thickness and coloration.   Vascular:  Dorsalis Pedis artery and Posterior Tibial artery pedal pulses are 2/4 bilateral.  Capillary fill time < 3 sec to all digits.   Neruologic: Grossly intact via light touch bilateral. Protective threshold intact to all sites bilateral.   Musculoskeletal: Pain with palpation about the lateral aspect of the right ankle approximately along the course of the anterior talofibular ligament as well as the sinus tarsi.  There is mild edema overlying the sinus tarsi.  Pain is not significantly increased with inversion eversion range of motion of the subtalar joint.  Plantarflexion of the digits does seem to elicit some pain though.  Hypermobile rear foot with increased inversion eversion range of motion.  Positive anterior drawer test.  Gait: Unassisted, Nonantalgic.   No images are attached to the encounter.  Radiographs:  Date: 10/18/2022 XR right ankle weightbearing AP/Lateral/Oblique   Findings: Attention directed to the tibiotalar joint there is no obvious evidence of osteoarthritic changes or osteochondral lesions.  No fractures identified.  Subtalar joint space also appears well-maintained with no significant osteoarthritic changes noted.  No evidence of coalition in the rear foot. Assessment:   1. Sprain of anterior talofibular ligament of right ankle, sequela   2. Instability of ankle joint,  unspecified laterality   3. Right foot pain      Plan:  Patient was evaluated and treated and all questions answered.  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 -At this time I would recommend she continue physical therapy as she is already doing physical therapy for her knee and I want her to add an ankle strengthening range of motion and proprioception exercises on the right side.  She said she will talk to her physical therapist about this -Additionally I would like to proceed with another steroid injection into the right lateral subtalar joint and sinus tarsi area.  Patient was agreeable.  After sterile alcohol prep proceeded with injection of 1 cc Kenalog 10 and 1 cc half percent Marcaine plain into the lateral sinus tarsi and subtalar joint patient tolerated well sterile adhesive bandage was applied. -As the x-rays are negative for any significant osseous abnormality I would like to order an MRI to assess the status of the anterior talofibular and calcaneofibular ligaments as well as the peroneal tendons.  I suspect the patient is dealing from lateral ankle instability and chronic pain related to this. -Additionally would like patient to use a Tri-Lock ankle brace that we have at this office.  Unfortunately did not have her size today but she will be called when we order the new 1 to get her a Tri-Lock brace for any time she is participating in physical activity. -Patient will return to follow-up MRI results and discuss how the steroid injection is doing for her.  Return in about 4 weeks (around 11/15/2022) for Follow-up right ankle instability and pain.          Everitt Amber, DPM [facility] /  [facility]

## 2022-10-22 ENCOUNTER — Other Ambulatory Visit: Payer: Self-pay | Admitting: Podiatry

## 2022-10-22 DIAGNOSIS — M79671 Pain in right foot: Secondary | ICD-10-CM

## 2022-10-22 DIAGNOSIS — S93491S Sprain of other ligament of right ankle, sequela: Secondary | ICD-10-CM

## 2022-10-22 DIAGNOSIS — M25373 Other instability, unspecified ankle: Secondary | ICD-10-CM

## 2022-10-24 DIAGNOSIS — M6281 Muscle weakness (generalized): Secondary | ICD-10-CM | POA: Diagnosis not present

## 2022-10-24 DIAGNOSIS — M25551 Pain in right hip: Secondary | ICD-10-CM | POA: Diagnosis not present

## 2022-10-24 DIAGNOSIS — M25561 Pain in right knee: Secondary | ICD-10-CM | POA: Diagnosis not present

## 2022-10-29 DIAGNOSIS — M25551 Pain in right hip: Secondary | ICD-10-CM | POA: Diagnosis not present

## 2022-10-29 DIAGNOSIS — M6281 Muscle weakness (generalized): Secondary | ICD-10-CM | POA: Diagnosis not present

## 2022-10-29 DIAGNOSIS — M25561 Pain in right knee: Secondary | ICD-10-CM | POA: Diagnosis not present

## 2022-11-05 DIAGNOSIS — M25551 Pain in right hip: Secondary | ICD-10-CM | POA: Diagnosis not present

## 2022-11-05 DIAGNOSIS — M25561 Pain in right knee: Secondary | ICD-10-CM | POA: Diagnosis not present

## 2022-11-05 DIAGNOSIS — M6281 Muscle weakness (generalized): Secondary | ICD-10-CM | POA: Diagnosis not present

## 2022-11-05 DIAGNOSIS — F411 Generalized anxiety disorder: Secondary | ICD-10-CM | POA: Diagnosis not present

## 2022-11-06 ENCOUNTER — Ambulatory Visit: Payer: BC Managed Care – PPO | Admitting: Neurology

## 2022-11-06 ENCOUNTER — Ambulatory Visit
Admission: RE | Admit: 2022-11-06 | Discharge: 2022-11-06 | Disposition: A | Discharge status: Home / Self Care | Payer: No Typology Code available for payment source | Source: Ambulatory Visit | Attending: Podiatry

## 2022-11-06 DIAGNOSIS — M25373 Other instability, unspecified ankle: Secondary | ICD-10-CM

## 2022-11-06 DIAGNOSIS — S93491S Sprain of other ligament of right ankle, sequela: Secondary | ICD-10-CM

## 2022-11-07 DIAGNOSIS — M25551 Pain in right hip: Secondary | ICD-10-CM | POA: Diagnosis not present

## 2022-11-07 DIAGNOSIS — M25561 Pain in right knee: Secondary | ICD-10-CM | POA: Diagnosis not present

## 2022-11-07 DIAGNOSIS — M6281 Muscle weakness (generalized): Secondary | ICD-10-CM | POA: Diagnosis not present

## 2022-11-15 ENCOUNTER — Telehealth: Payer: Self-pay | Admitting: Podiatry

## 2022-11-15 ENCOUNTER — Ambulatory Visit: Payer: BC Managed Care – PPO | Admitting: Podiatry

## 2022-11-15 NOTE — Telephone Encounter (Signed)
Pt had to cancel her appointment due to emergency with a client. She was coming in for a one month follow up on a scan. Pt wants to know if she can get a phone call to go over the results?  Please advise

## 2022-11-21 DIAGNOSIS — M25551 Pain in right hip: Secondary | ICD-10-CM | POA: Diagnosis not present

## 2022-11-21 DIAGNOSIS — M6281 Muscle weakness (generalized): Secondary | ICD-10-CM | POA: Diagnosis not present

## 2022-11-21 DIAGNOSIS — M25561 Pain in right knee: Secondary | ICD-10-CM | POA: Diagnosis not present

## 2022-11-23 DIAGNOSIS — M9903 Segmental and somatic dysfunction of lumbar region: Secondary | ICD-10-CM | POA: Diagnosis not present

## 2022-11-23 DIAGNOSIS — M79604 Pain in right leg: Secondary | ICD-10-CM | POA: Diagnosis not present

## 2022-11-23 DIAGNOSIS — M6240 Contracture of muscle, unspecified site: Secondary | ICD-10-CM | POA: Diagnosis not present

## 2022-11-23 DIAGNOSIS — M9905 Segmental and somatic dysfunction of pelvic region: Secondary | ICD-10-CM | POA: Diagnosis not present

## 2022-11-28 DIAGNOSIS — M6281 Muscle weakness (generalized): Secondary | ICD-10-CM | POA: Diagnosis not present

## 2022-11-28 DIAGNOSIS — M25551 Pain in right hip: Secondary | ICD-10-CM | POA: Diagnosis not present

## 2022-11-28 DIAGNOSIS — M25561 Pain in right knee: Secondary | ICD-10-CM | POA: Diagnosis not present

## 2022-12-03 DIAGNOSIS — F411 Generalized anxiety disorder: Secondary | ICD-10-CM | POA: Diagnosis not present

## 2022-12-10 DIAGNOSIS — M25561 Pain in right knee: Secondary | ICD-10-CM | POA: Diagnosis not present

## 2022-12-10 DIAGNOSIS — M25551 Pain in right hip: Secondary | ICD-10-CM | POA: Diagnosis not present

## 2022-12-10 DIAGNOSIS — M6281 Muscle weakness (generalized): Secondary | ICD-10-CM | POA: Diagnosis not present

## 2022-12-13 ENCOUNTER — Ambulatory Visit (INDEPENDENT_AMBULATORY_CARE_PROVIDER_SITE_OTHER): Payer: No Typology Code available for payment source | Admitting: Podiatry

## 2022-12-13 DIAGNOSIS — M25371 Other instability, right ankle: Secondary | ICD-10-CM

## 2022-12-13 DIAGNOSIS — M25373 Other instability, unspecified ankle: Secondary | ICD-10-CM

## 2022-12-13 DIAGNOSIS — S93491S Sprain of other ligament of right ankle, sequela: Secondary | ICD-10-CM | POA: Diagnosis not present

## 2022-12-13 NOTE — Progress Notes (Signed)
Subjective:  Patient ID: Zoe Briggs, female    DOB: 1975/12/11,  MRN: [id number]  Chief Complaint  Patient presents with   Follow-up    Patient states no pain and that PT has been helping a lot.    47 y.o. female presents for follow-up of right lateral ankle pain.  She says that her pain is improved at this point in time.  She has been using a compressive style ankle brace as well as doing physical therapy.  She thinks that physical therapy is helping a lot.  She is not interested in surgical correction at this time despite some abnormal findings on the MRI.  I  Past Medical History:  Diagnosis Date   ADD (attention deficit  disorder)    Follows w/ [facility].   Allergy    Asthma    Seasonal, Follows w/ Abran DukeBrenda Manfredi, PA.   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  Nails are of normal length thickness and coloration.   Vascular:  Dorsalis Pedis artery and Posterior Tibial artery pedal pulses are 2/4 bilateral.  Capillary fill time < 3 sec to all digits.   Neruologic: Grossly intact via light touch bilateral. Protective threshold intact to all sites bilateral.   Musculoskeletal: Minimal pain with palpation about the lateral aspect of the right ankle at this time improved from prior.  Pain is primarily over the sinus tarsi and along the course of the ATFL  Gait: Unassisted, Nonantalgic.   No images are attached to the encounter.  Radiographs:  Date: 10/18/2022 XR right ankle weightbearing AP/Lateral/Oblique   Findings: Attention directed to the tibiotalar joint there is no obvious evidence of osteoarthritic changes or osteochondral lesions.  No fractures identified.  Subtalar joint space also appears well-maintained with no significant osteoarthritic changes noted.  No evidence of coalition in the rear foot. Assessment:   1. Sprain of anterior talofibular ligament of right ankle, sequela   2. Instability of ankle joint, unspecified laterality       Plan:  Patient was evaluated and treated and all questions answered.  # Prior sprain of the anterior talofibular ligament with possible ligament damage and chronic  ankle instability of the right ankle -Discussed the MRI findings in detail with the patient.  Patient does have evidence of peroneal tendon split longitudinal tear as well as some marrow edema in the cuboid but without fracture -Fortunately the patient is not having pain at this time.  Recommend continued physical therapy as well as use of Tri-Lock brace or compression style brace. -Follow-up as needed if the patient has worsening pain  Return if symptoms worsen or fail to improve.          Corinna GabAlex F Hayle Parisi, DPM [facility] / [facility]

## 2022-12-21 DIAGNOSIS — Z789 Other specified health status: Secondary | ICD-10-CM | POA: Insufficient documentation

## 2023-01-01 DIAGNOSIS — N3040 Irradiation cystitis without hematuria: Secondary | ICD-10-CM | POA: Diagnosis not present

## 2023-01-15 DIAGNOSIS — Z85038 Personal history of other malignant neoplasm of large intestine: Secondary | ICD-10-CM | POA: Diagnosis not present

## 2023-01-15 DIAGNOSIS — Z933 Colostomy status: Secondary | ICD-10-CM | POA: Diagnosis not present

## 2023-01-17 DIAGNOSIS — M1811 Unilateral primary osteoarthritis of first carpometacarpal joint, right hand: Secondary | ICD-10-CM | POA: Diagnosis not present

## 2023-02-01 DIAGNOSIS — R928 Other abnormal and inconclusive findings on diagnostic imaging of breast: Secondary | ICD-10-CM | POA: Diagnosis not present

## 2023-02-27 DIAGNOSIS — M25551 Pain in right hip: Secondary | ICD-10-CM | POA: Diagnosis not present

## 2023-02-27 DIAGNOSIS — M25561 Pain in right knee: Secondary | ICD-10-CM | POA: Diagnosis not present

## 2023-02-27 DIAGNOSIS — M6281 Muscle weakness (generalized): Secondary | ICD-10-CM | POA: Diagnosis not present

## 2023-02-27 DIAGNOSIS — M25571 Pain in right ankle and joints of right foot: Secondary | ICD-10-CM | POA: Diagnosis not present

## 2023-03-04 DIAGNOSIS — M6281 Muscle weakness (generalized): Secondary | ICD-10-CM | POA: Diagnosis not present

## 2023-03-04 DIAGNOSIS — M25551 Pain in right hip: Secondary | ICD-10-CM | POA: Diagnosis not present

## 2023-03-04 DIAGNOSIS — M25561 Pain in right knee: Secondary | ICD-10-CM | POA: Diagnosis not present

## 2023-03-04 DIAGNOSIS — M25571 Pain in right ankle and joints of right foot: Secondary | ICD-10-CM | POA: Diagnosis not present

## 2023-03-06 DIAGNOSIS — R0602 Shortness of breath: Secondary | ICD-10-CM

## 2023-03-06 DIAGNOSIS — I10 Essential (primary) hypertension: Secondary | ICD-10-CM

## 2023-03-06 DIAGNOSIS — R002 Palpitations: Secondary | ICD-10-CM

## 2023-03-06 DIAGNOSIS — R5383 Other fatigue: Secondary | ICD-10-CM | POA: Insufficient documentation

## 2023-03-06 DIAGNOSIS — R0683 Snoring: Secondary | ICD-10-CM | POA: Insufficient documentation

## 2023-03-06 DIAGNOSIS — R030 Elevated blood-pressure reading, without diagnosis of hypertension: Secondary | ICD-10-CM | POA: Insufficient documentation

## 2023-03-06 HISTORY — DX: Snoring: R06.83

## 2023-03-06 HISTORY — DX: Shortness of breath: R06.02

## 2023-03-06 HISTORY — DX: Essential (primary) hypertension: I10

## 2023-03-06 HISTORY — DX: Palpitations: R00.2

## 2023-03-11 ENCOUNTER — Ambulatory Visit: Payer: No Typology Code available for payment source | Admitting: Podiatry

## 2023-04-01 ENCOUNTER — Ambulatory Visit: Payer: No Typology Code available for payment source

## 2023-04-02 ENCOUNTER — Ambulatory Visit
Admission: RE | Admit: 2023-04-02 | Discharge: 2023-04-02 | Disposition: A | Discharge status: Home / Self Care | Payer: No Typology Code available for payment source | Source: Ambulatory Visit | Attending: Internal Medicine | Admitting: Internal Medicine

## 2023-04-02 ENCOUNTER — Other Ambulatory Visit: Payer: Self-pay

## 2023-04-02 VITALS — BP 131/80 | HR 92 | Temp 98.1°F | Resp 18

## 2023-04-02 DIAGNOSIS — R350 Frequency of micturition: Secondary | ICD-10-CM | POA: Diagnosis not present

## 2023-04-02 DIAGNOSIS — N3001 Acute cystitis with hematuria: Secondary | ICD-10-CM | POA: Diagnosis not present

## 2023-04-02 LAB — POCT URINALYSIS DIP (MANUAL ENTRY)
Bilirubin, UA: NEGATIVE
Glucose, UA: NEGATIVE mg/dL
Ketones, POC UA: NEGATIVE mg/dL
Nitrite, UA: NEGATIVE
Protein Ur, POC: NEGATIVE mg/dL
Spec Grav, UA: 1.01 (ref 1.010–1.025)
Urobilinogen, UA: 0.2 E.U./dL
pH, UA: 7 (ref 5.0–8.0)

## 2023-04-02 MED ORDER — NITROFURANTOIN MONOHYD MACRO 100 MG PO CAPS: 100.0000 mg | ORAL_CAPSULE | Freq: Two times a day (BID) | ORAL | 0 refills | Status: DC

## 2023-04-02 NOTE — ED Triage Notes (Signed)
Pt here for dysuria and frequency x 5 days; pt sts hx of same in past with UTI and IC

## 2023-04-02 NOTE — ED Provider Notes (Signed)
[name]    CSN: [id number] Arrival date & time: 04/02/23  1020      History   Chief Complaint Chief Complaint  Patient presents with   Urinary Frequency    UTI - Entered by patient    HPI Zoe Briggs is a 47 y.o. female.   Patient presents with urinary burning and frequency that started about 5 days ago.  Patient reports that she has had frequent UTIs in the past and also has interstitial cystitis.  Reports that when symptoms are prolonged past 3 days, she gets concerned about UTI.  She has not had a UTI in about 7 months.  She has been taking Uribel for urinary symptoms which is why her urine is green.  Denies abdominal pain, hematuria, back pain, fever, chills.  States that she has already contacted her urologist who has prescribed Elmiron in case it is not a UTI.   Urinary Frequency    Past Medical History:  Diagnosis Date   ADD (attention deficit  disorder)    Follows w/ [facility].   Allergy    Asthma    Seasonal, Follows w/ Abran Duke, PA.   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   ADD (attention deficit disorder) 07/08/2012   Asthma 05/27/2012   Depression  05/27/2012   Colon cancer (HCC) 05/27/2012   Seasonal allergies 05/27/2012    Past Surgical History:  Procedure Laterality Date   COLON SURGERY  2006   colonscopy     2012 and 08-28-2018   colonscopy  2005   CYSTOSCOPY WITH HYDRODISTENSION AND BIOPSY Bilateral 09/17/2022   Procedure: CYSTOSCOPY/BIOPSY/HYDRODISTENSION FULGURATION BILATERAL RETROGRADE PYELOGRAM;  Surgeon: Crista Elliot, MD;  Location: [facility];  Service: Urology;  Laterality: Bilateral;   ERCP  12/04/2019   stent to pancreas done at [place] for congenital abnormality that can cause pancreatitis   FOOT SURGERY Right 02/2017   bunionectomy   TONSILLECTOMY AND ADENOIDECTOMY     age 63 or 5   UPPER GI ENDOSCOPY  2000   UPPER GI ENDOSCOPY  08/29/2018    OB History   No obstetric history on file.      Home Medications    Prior to Admission medications   Medication Sig Start Date End Date Taking? Authorizing Provider  nitrofurantoin, macrocrystal-monohydrate, (MACROBID) 100 MG capsule Take 1 capsule (100 mg total) by mouth 2 (two) times daily. 04/02/23  Yes Kaja Jackowski, Rolly Salter E, FNP  acetaminophen (TYLENOL) 325 MG tablet Take 650 mg  by mouth every 6 (six) hours as needed for mild pain or headache.    [provider]  AJOVY 225 MG/1.5ML SOSY Inject 225 mg into the skin every 30 (thirty) days. 08/07/19   [provider]  baclofen (LIORESAL) 10 MG tablet Take 10 mg by mouth every 8 (eight) hours as needed. 10/03/21   [provider]  buPROPion (WELLBUTRIN XL) 150 MG 24 hr tablet Take 150 mg by mouth every morning. 12/03/22   [provider]  cephALEXin (KEFLEX) 500 MG capsule Take 500 mg by mouth 4 (four) times daily. Will complete on 09/14/22. Patient not taking: Reported on 04/02/2023    [provider]  estradiol-norethindrone [facility]) 0.05-0.14 MG/DAY Place 1 patch onto the skin 2 (two) times a week.    [provider]  HYDROcodone-acetaminophen  (NORCO/VICODIN) 5-325 MG tablet Take 1 tablet by mouth every 4 (four) hours as needed for moderate pain. 09/17/22 09/17/23  Ray Church III, MD  ibuprofen (ADVIL) 200 MG tablet Take 200 mg by mouth every 6 (six) hours as needed.    [provider]  levalbuterol Pauline Aus HFA) 45 MCG/ACT inhaler Inhale 2 puffs into the lungs every 8 (eight) hours as needed for wheezing. 08/13/13   Donato Schultz, DO  loratadine (CLARITIN) 10 MG tablet Take 10 mg by mouth daily.    [provider]  Meth-Hyo-M Bl-Na Phos-Ph Sal (URO-MP) 118 MG CAPS Take 2 capsules by mouth every 8 (eight) hours as needed. urabelle 02/02/22   [provider]  methenamine (HIPREX) 1 g tablet Take 1 g by mouth 2 (two) times daily with a meal.    [provider]  methylphenidate 18 MG PO CR tablet Take 27 mg by mouth every morning. Patient not taking: Reported on 09/14/2022 07/20/21   [provider]  Multiple Vitamin (MULTIVITAMIN) tablet Take 1 tablet by mouth daily.    [provider]  Omega-3 Fatty Acids (OMEGA 3 PO) Take by mouth 2 (two) times daily with breakfast and lunch. 2 tabs bid plant based    [provider]  predniSONE (DELTASONE) 20 MG tablet Take 20 mg by mouth daily with breakfast. Will complete on 09/16/22.    [provider]  Pregabalin (LYRICA PO) Take 25 mg by mouth as needed.    [provider]  progesterone (PROMETRIUM) 100 MG capsule Take 100 mg by mouth at bedtime.    [provider]  Ubrogepant 100 MG TABS Take by mouth as needed. ubrevly    [provider]  UNABLE TO FIND Testesterone pellet every 3 months    [provider]    Family History Family History  Problem Relation Age of Onset   Arthritis Mother    Heart disease Mother        Hole in the heart   Hypertension Mother    Cancer Paternal Aunt        brain   Cancer Paternal Grandfather        skin    Social History Social History    Tobacco Use   Smoking status: Never   Smokeless tobacco: Never  Vaping Use   Vaping status: Never Used  Substance Use Topics   Alcohol use: Not Currently   Drug use: No     Allergies   Ciprofloxacin, Sulfa antibiotics, Imitrex [sumatriptan], and Iodinated contrast media   Review of Systems Review of Systems Per HPI  Physical Exam Triage Vital Signs ED Triage Vitals  Encounter  Vitals Group     BP 04/02/23 1043 131/80     Systolic BP Percentile --      Diastolic BP Percentile --      Pulse Rate 04/02/23 1043 92     Resp 04/02/23 1043 18     Temp 04/02/23 1043 98.1 F (36.7 C)     Temp Source 04/02/23 1043 Oral     SpO2 04/02/23 1043 96 %     Weight --      Height --      Head Circumference --      Peak Flow --      Pain Score 04/02/23 1044 3     Pain Loc --      Pain Education --      Exclude from Growth Chart --    No data found.  Updated Vital Signs BP 131/80 (BP Location: Left Arm)   Pulse 92   Temp 98.1 F (36.7 C) (Oral)   Resp 18   SpO2 96%   Visual Acuity Right Eye Distance:   Left Eye Distance:   Bilateral Distance:    Right Eye Near:   Left Eye Near:    Bilateral Near:     Physical Exam Constitutional:      General: She is not in acute distress.    Appearance: Normal appearance. She is not toxic-appearing or diaphoretic.  HENT:     Head: Normocephalic and atraumatic.  Eyes:     Extraocular Movements: Extraocular movements intact.     Conjunctiva/sclera: Conjunctivae normal.  Pulmonary:     Effort: Pulmonary effort is normal.  Neurological:     General: No focal deficit present.     Mental Status: She is alert and oriented to person, place, and time. Mental status is at baseline.  Psychiatric:        Mood and Affect: Mood normal.        Behavior: Behavior normal.        Thought Content: Thought content normal.        Judgment: Judgment normal.      UC Treatments / Results  Labs (all labs ordered are listed, but only  abnormal results are displayed) Labs Reviewed  POCT URINALYSIS DIP (MANUAL ENTRY) - Abnormal; Notable for the following components:      Result Value   Color, UA green (*)    Clarity, UA cloudy (*)    Blood, UA trace-lysed (*)    Leukocytes, UA Large (3+) (*)    All other components within normal limits  URINE CULTURE    EKG   Radiology No results found.  Procedures Procedures (including critical care time)  Medications Ordered in UC Medications - No data to display  Initial Impression / Assessment and Plan / UC Course  I have reviewed the triage vital signs and the nursing notes.  Pertinent labs & imaging results that were available during my care of the patient were reviewed by me and considered in my medical decision making (see chart for details).     UA indicating possible UTI.  Will treat with Macrobid.  Urine culture pending.  Advised adequate fluid hydration and following up with urology if symptoms persist or worsen.  Patient verbalized understanding and was agreeable with plan. Final Clinical Impressions(s) / UC Diagnoses   Final diagnoses:  Acute cystitis with hematuria  Urinary frequency     Discharge Instructions      I have prescribed you Macrobid for your UTI as it appears  that this may be the best option for antibiotic at this time given recent urine cultures.  Urine culture is pending.  We will call with results.  Ensure that you are drinking plenty of water.  Follow-up with urology if symptoms persist or worsen.    ED Prescriptions     Medication Sig Dispense Auth. Provider   nitrofurantoin, macrocrystal-monohydrate, (MACROBID) 100 MG capsule Take 1 capsule (100 mg total) by mouth 2 (two) times daily. 10 capsule Gustavus Bryant, [place]      PDMP not reviewed this encounter.   Gustavus Bryant, [place] 04/02/23 1148

## 2023-04-02 NOTE — Discharge Instructions (Signed)
I have prescribed you Macrobid for your UTI as it appears that this may be the best option for antibiotic at this time given recent urine cultures.  Urine culture is pending.  We will call with results.  Ensure that you are drinking plenty of water.  Follow-up with urology if symptoms persist or worsen.

## 2023-04-03 LAB — URINE CULTURE: Culture: 80000 — AB

## 2023-04-04 LAB — URINE CULTURE

## 2023-05-12 ENCOUNTER — Ambulatory Visit: Payer: No Typology Code available for payment source

## 2023-05-13 ENCOUNTER — Ambulatory Visit: Payer: No Typology Code available for payment source

## 2023-05-13 ENCOUNTER — Ambulatory Visit: Payer: Self-pay

## 2023-05-14 ENCOUNTER — Ambulatory Visit (HOSPITAL_COMMUNITY): Payer: No Typology Code available for payment source

## 2023-05-28 ENCOUNTER — Other Ambulatory Visit: Payer: Self-pay

## 2023-05-28 ENCOUNTER — Ambulatory Visit
Admission: RE | Admit: 2023-05-28 | Discharge: 2023-05-28 | Disposition: A | Discharge status: Home / Self Care | Payer: No Typology Code available for payment source | Source: Ambulatory Visit | Attending: Internal Medicine | Admitting: Internal Medicine

## 2023-05-28 VITALS — BP 133/79 | HR 95 | Temp 98.6°F | Resp 18

## 2023-05-28 DIAGNOSIS — R300 Dysuria: Secondary | ICD-10-CM | POA: Diagnosis not present

## 2023-05-28 DIAGNOSIS — N3001 Acute cystitis with hematuria: Secondary | ICD-10-CM | POA: Insufficient documentation

## 2023-05-28 LAB — POCT URINALYSIS DIP (MANUAL ENTRY)
Bilirubin, UA: NEGATIVE
Glucose, UA: NEGATIVE mg/dL
Ketones, POC UA: NEGATIVE mg/dL
Nitrite, UA: NEGATIVE
Protein Ur, POC: NEGATIVE mg/dL
Spec Grav, UA: 1.01 (ref 1.010–1.025)
Urobilinogen, UA: 0.2 U/dL
pH, UA: 5.5 (ref 5.0–8.0)

## 2023-05-28 MED ORDER — AMOXICILLIN-POT CLAVULANATE 875-125 MG PO TABS: 1.0000 | ORAL_TABLET | Freq: Two times a day (BID) | ORAL | 0 refills | Status: DC

## 2023-05-28 NOTE — Discharge Instructions (Signed)
I have prescribed an antibiotic to take for urinary tract infection.  Urine culture is pending.  We will call when it results.

## 2023-05-28 NOTE — ED Provider Notes (Signed)
[name]    CSN: [id number] Arrival date & time: 05/28/23  1350      History   Chief Complaint Chief Complaint  Patient presents with   Urinary Frequency    Entered by patient    HPI Zoe Briggs is a 47 y.o. female.   Patient presents with urinary burning and frequency that started several weeks ago.  Patient has a history of interstitial cystitis where she is followed by urology.  States that it started off as a concern for interstitial cystitis flareup but it seemed to worsen over the past few weeks so she is concerned for UTI.  Patient does take Uribel which is turning her urine green.  She denies any fever, body aches, chills but does report some lower back pain over the past few days.  Last seen in July and treated for urinary tract infection with Macrobid. Patient reports that she is postmenopausal.    Urinary Frequency    Past Medical History:  Diagnosis Date   ADD (attention deficit  disorder)    Follows w/ [facility].   Allergy    Asthma    Seasonal, Follows w/ Abran Duke, PA.   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   ADD (attention deficit disorder) 07/08/2012    Asthma 05/27/2012   Depression 05/27/2012   Colon cancer (HCC) 05/27/2012   Seasonal allergies 05/27/2012    Past Surgical History:  Procedure Laterality Date   COLON SURGERY  2006   colonscopy     2012 and 08-28-2018   colonscopy  2005   CYSTOSCOPY WITH HYDRODISTENSION AND BIOPSY Bilateral 09/17/2022   Procedure: CYSTOSCOPY/BIOPSY/HYDRODISTENSION FULGURATION BILATERAL RETROGRADE PYELOGRAM;  Surgeon: Crista Elliot, MD;  Location: [facility];  Service: Urology;  Laterality: Bilateral;   ERCP  12/04/2019   stent to pancreas done at [facility] for congenital abnormality that can cause pancreatitis   FOOT SURGERY Right 02/2017   bunionectomy   TONSILLECTOMY AND ADENOIDECTOMY     age 44 or 5   UPPER GI ENDOSCOPY  2000   UPPER GI ENDOSCOPY  08/29/2018    OB History   No obstetric history on file.      Home Medications    Prior to Admission medications   Medication Sig Start Date End Date Taking? Authorizing Provider  amoxicillin-clavulanate (AUGMENTIN) 875-125 MG tablet Take 1 tablet by mouth every 12 (twelve) hours. 05/28/23  Yes Tavian Callander, Rolly Salter E, FNP  acetaminophen (TYLENOL) 325 MG tablet Take 650 mg by  mouth every 6 (six) hours as needed for mild pain or headache.    [provider]  AJOVY 225 MG/1.5ML SOSY Inject 225 mg into the skin every 30 (thirty) days. 08/07/19   [provider]  baclofen (LIORESAL) 10 MG tablet Take 10 mg by mouth every 8 (eight) hours as needed. 10/03/21   [provider]  buPROPion (WELLBUTRIN XL) 150 MG 24 hr tablet Take 150 mg by mouth every morning. 12/03/22   [provider]  cephALEXin (KEFLEX) 500 MG capsule Take 500 mg by mouth 4 (four) times daily. Will complete on 09/14/22. Patient not taking: Reported on 04/02/2023    [provider]  estradiol-norethindrone [facility]) 0.05-0.14 MG/DAY Place 1 patch onto the skin 2 (two) times a week.    [provider]   HYDROcodone-acetaminophen (NORCO/VICODIN) 5-325 MG tablet Take 1 tablet by mouth every 4 (four) hours as needed for moderate pain. 09/17/22 09/17/23  Ray Church III, MD  ibuprofen (ADVIL) 200 MG tablet Take 200 mg by mouth every 6 (six) hours as needed.    [provider]  levalbuterol Pauline Aus HFA) 45 MCG/ACT inhaler Inhale 2 puffs into the lungs every 8 (eight) hours as needed for wheezing. 08/13/13   Donato Schultz, DO  loratadine (CLARITIN) 10 MG tablet Take 10 mg by mouth daily.    [provider]  Meth-Hyo-M Bl-Na Phos-Ph Sal (URO-MP) 118 MG CAPS Take 2 capsules by mouth every 8 (eight) hours as needed. urabelle 02/02/22   [provider]  methenamine (HIPREX) 1 g tablet Take 1 g by mouth 2 (two) times daily with a meal.    [provider]  methylphenidate 18 MG PO CR tablet Take 27 mg by mouth every morning. Patient not taking: Reported on 09/14/2022 07/20/21   [provider]  Multiple Vitamin (MULTIVITAMIN) tablet Take 1 tablet by mouth daily.    [provider]  Omega-3 Fatty Acids (OMEGA 3 PO) Take by mouth 2 (two) times daily with breakfast and lunch. 2 tabs bid plant based    [provider]  predniSONE (DELTASONE) 20 MG tablet Take 20 mg by mouth daily with breakfast. Will complete on 09/16/22. Patient not taking: Reported on 05/28/2023    [provider]  Pregabalin (LYRICA PO) Take 25 mg by mouth as needed.    [provider]  progesterone (PROMETRIUM) 100 MG capsule Take 100 mg by mouth at bedtime.    [provider]  Ubrogepant 100 MG TABS Take by mouth as needed. ubrevly    [provider]  UNABLE TO FIND Testesterone pellet every 3 months    [provider]    Family History Family History  Problem Relation Age of Onset   Arthritis Mother    Heart disease Mother        Hole in the heart   Hypertension Mother    Cancer Paternal Aunt        brain   Cancer Paternal  Grandfather        skin    Social History Social History   Tobacco Use   Smoking status: Never   Smokeless tobacco: Never  Vaping Use   Vaping status: Never Used  Substance Use Topics   Alcohol use: Not Currently   Drug use: No     Allergies   Ciprofloxacin, Sulfa antibiotics, Imitrex [sumatriptan], and Iodinated contrast media   Review of Systems Review of Systems Per HPI  Physical Exam Triage Vital Signs  ED Triage Vitals  Encounter Vitals Group     BP 05/28/23 1422 133/79     Systolic BP Percentile --      Diastolic BP Percentile --      Pulse Rate 05/28/23 1422 95     Resp 05/28/23 1422 18     Temp 05/28/23 1422 98.6 F (37 C)     Temp Source 05/28/23 1422 Oral     SpO2 05/28/23 1422 97 %     Weight --      Height --      Head Circumference --      Peak Flow --      Pain Score 05/28/23 1423 3     Pain Loc --      Pain Education --      Exclude from Growth Chart --    No data found.  Updated Vital Signs BP 133/79 (BP Location: Left Arm)   Pulse 95   Temp 98.6 F (37 C) (Oral)   Resp 18   SpO2 97%   Visual Acuity Right Eye Distance:   Left Eye Distance:   Bilateral Distance:    Right Eye Near:   Left Eye Near:    Bilateral Near:     Physical Exam Constitutional:      General: She is not in acute distress.    Appearance: Normal appearance. She is not toxic-appearing or diaphoretic.  HENT:     Head: Normocephalic and atraumatic.  Eyes:     Extraocular Movements: Extraocular movements intact.     Conjunctiva/sclera: Conjunctivae normal.  Pulmonary:     Effort: Pulmonary effort is normal.  Neurological:     General: No focal deficit present.     Mental Status: She is alert and oriented to person, place, and time. Mental status is at baseline.  Psychiatric:        Mood and Affect: Mood normal.        Behavior: Behavior normal.        Thought Content: Thought content normal.        Judgment: Judgment normal.      UC Treatments /  Results  Labs (all labs ordered are listed, but only abnormal results are displayed) Labs Reviewed  POCT URINALYSIS DIP (MANUAL ENTRY) - Abnormal; Notable for the following components:      Result Value   Color, UA green (*)    Blood, UA trace-intact (*)    Leukocytes, UA Trace (*)    All other components within normal limits  URINE CULTURE    EKG   Radiology No results found.  Procedures Procedures (including critical care time)  Medications Ordered in UC Medications - No data to display  Initial Impression / Assessment and Plan / UC Course  I have reviewed the triage vital signs and the nursing notes.  Pertinent labs & imaging results that were available during my care of the patient were reviewed by me and considered in my medical decision making (see chart for details).     UA showing trace leukocytes but with patient's history and no improvement with interstitial cystitis medication, will opt to treat for UTI with Augmentin antibiotic today.  Urine culture pending.  Advised patient of strict follow-up with urology if symptoms persist or worsen.  Patient verbalized understanding and was agreeable with plan. Final Clinical Impressions(s) / UC Diagnoses   Final diagnoses:  Acute cystitis with hematuria  Dysuria     Discharge Instructions      I  have prescribed an antibiotic to take for urinary tract infection.  Urine culture is pending.  We will call when it results.    ED Prescriptions     Medication Sig Dispense Auth. Provider   amoxicillin-clavulanate (AUGMENTIN) 875-125 MG tablet Take 1 tablet by mouth every 12 (twelve) hours. 14 tablet Glenn, Acie Fredrickson, [place]      PDMP not reviewed this encounter.   Gustavus Bryant, [place] 05/28/23 [phone]

## 2023-05-28 NOTE — ED Triage Notes (Addendum)
Pt here for dysuria x several weeks she is unsure if is her IC or UTI; urine is green due to meds

## 2023-05-30 LAB — URINE CULTURE: Culture: >=100000 — AB

## 2023-06-02 ENCOUNTER — Telehealth: Payer: No Typology Code available for payment source | Admitting: Family

## 2023-06-02 DIAGNOSIS — R399 Unspecified symptoms and signs involving the genitourinary system: Secondary | ICD-10-CM

## 2023-06-02 MED ORDER — CEPHALEXIN 500 MG PO CAPS
500.0000 mg | ORAL_CAPSULE | Freq: Two times a day (BID) | ORAL | 0 refills | Status: DC
Start: 2023-06-02 — End: 2023-07-08

## 2023-06-02 NOTE — Progress Notes (Signed)
E-Visit for Urinary Problems  We are sorry that you are not feeling well.  Here is how we plan to help!  Based on what you shared with me it looks like you most likely have a simple urinary tract infection.  A UTI (Urinary Tract Infection) is a bacterial infection of the bladder.  Most cases of urinary tract infections are simple to treat but a key part of your care is to encourage you to drink plenty of fluids and watch your symptoms carefully.  I have prescribed Keflex 500 mg twice a day for 7 days.  Your symptoms should gradually improve. Call us if the burning in your urine worsens, you develop worsening fever, back pain or pelvic pain or if your symptoms do not resolve after completing the antibiotic.  If your symptoms do not improve in the next 48 hours you need to be seen in person.    Urinary tract infections can be prevented by drinking plenty of water to keep your body hydrated.  Also be sure when you wipe, wipe from front to back and don't hold it in!  If possible, empty your bladder every 4 hours.  HOME CARE Drink plenty of fluids Compete the full course of the antibiotics even if the symptoms resolve Remember, when you need to go.go. Holding in your urine can increase the likelihood of getting a UTI! GET HELP RIGHT AWAY IF: You cannot urinate You get a high fever Worsening back pain occurs You see blood in your urine You feel sick to your stomach or throw up You feel like you are going to pass out  MAKE SURE YOU  Understand these instructions. Will watch your condition. Will get help right away if you are not doing well or get worse.   Thank you for choosing an e-visit.  Your e-visit answers were reviewed by a board certified advanced clinical practitioner to complete your personal care plan. Depending upon the condition, your plan could have included both over the counter or prescription medications.  Please review your pharmacy choice. Make sure the pharmacy is  open so you can pick up prescription now. If there is a problem, you may contact your provider through [employer] and have the prescription routed to another pharmacy.  Your safety is important to [place]. If you have drug allergies check your prescription carefully.   For the next 24 hours you can use MyChart to ask questions about today's visit, request a non-urgent call back, or ask for a work or school excuse. You will get an email in the next two days asking about your experience. I hope that your e-visit has been valuable and will speed your recovery.   Approximately 5 minutes was spent documenting and reviewing patient's chart.

## 2023-06-03 ENCOUNTER — Ambulatory Visit: Payer: No Typology Code available for payment source

## 2023-06-24 ENCOUNTER — Ambulatory Visit: Payer: No Typology Code available for payment source

## 2023-06-27 ENCOUNTER — Ambulatory Visit: Payer: No Typology Code available for payment source

## 2023-06-28 ENCOUNTER — Ambulatory Visit: Payer: No Typology Code available for payment source

## 2023-06-29 ENCOUNTER — Ambulatory Visit: Payer: No Typology Code available for payment source

## 2023-07-02 ENCOUNTER — Ambulatory Visit: Payer: No Typology Code available for payment source

## 2023-07-08 ENCOUNTER — Emergency Department (HOSPITAL_BASED_OUTPATIENT_CLINIC_OR_DEPARTMENT_OTHER)
Admission: EM | Admit: 2023-07-08 | Discharge: 2023-07-08 | Payer: No Typology Code available for payment source | Source: Home / Self Care

## 2023-07-08 ENCOUNTER — Other Ambulatory Visit: Payer: Self-pay

## 2023-07-08 ENCOUNTER — Ambulatory Visit
Admission: EM | Admit: 2023-07-08 | Discharge: 2023-07-08 | Disposition: A | Discharge status: Home / Self Care | Payer: No Typology Code available for payment source | Attending: Internal Medicine | Admitting: Internal Medicine

## 2023-07-08 DIAGNOSIS — R3989 Other symptoms and signs involving the genitourinary system: Secondary | ICD-10-CM | POA: Diagnosis present

## 2023-07-08 DIAGNOSIS — M545 Low back pain, unspecified: Secondary | ICD-10-CM

## 2023-07-08 LAB — POCT URINALYSIS DIP (MANUAL ENTRY)
Bilirubin, UA: NEGATIVE
Blood, UA: NEGATIVE
Glucose, UA: NEGATIVE mg/dL
Ketones, POC UA: NEGATIVE mg/dL
Nitrite, UA: NEGATIVE
Protein Ur, POC: NEGATIVE mg/dL
Spec Grav, UA: 1.015 (ref 1.010–1.025)
Urobilinogen, UA: 0.2 U/dL
pH, UA: 7 (ref 5.0–8.0)

## 2023-07-08 LAB — POCT URINE PREGNANCY: Preg Test, Ur: NEGATIVE

## 2023-07-08 MED ORDER — CEPHALEXIN 500 MG PO CAPS
500.0000 mg | ORAL_CAPSULE | Freq: Four times a day (QID) | ORAL | 0 refills | Status: AC
Start: 2023-07-08 — End: 2023-07-15

## 2023-07-08 NOTE — ED Provider Notes (Signed)
[facility]    CSN: [id number] Arrival date & time: 07/08/23  1905      History   Chief Complaint No chief complaint on file.   HPI Zoe Briggs is a 47 y.o. female presents for bladder pressure and low back pain.  Patient reports 1 week of bladder pressure with bilateral low back pain.  She states yesterday she did not feel well and felt like she had a low-grade fever.  She denies any urinary burning, urgency, frequency, hematuria, malodorous urine.  No nausea/vomiting or flank pain.  Does have a history of pyelonephritis and is concerned her symptoms are suggestive of that.  She does have a history of interstitial cystitis as well as repeat UTIs.  She does have an appointment with a new urologist tomorrow.  She has been taking Tylenol and ibuprofen for her symptoms.  No vaginal discharge or STD concern.  No other concerns at this time.  HPI  Past Medical History:  Diagnosis Date   ADD (attention deficit  disorder)    Follows w/ [facility].   Allergy    Asthma    Seasonal, Follows w/ Abran Duke, [place].   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   ADD (attention deficit disorder) 07/08/2012    Asthma 05/27/2012   Depression 05/27/2012   Colon cancer (HCC) 05/27/2012   Seasonal allergies 05/27/2012    Past Surgical History:  Procedure Laterality Date   COLON SURGERY  2006   colonscopy     2012 and 08-28-2018   colonscopy  2005   CYSTOSCOPY WITH HYDRODISTENSION AND BIOPSY Bilateral 09/17/2022   Procedure: CYSTOSCOPY/BIOPSY/HYDRODISTENSION FULGURATION BILATERAL RETROGRADE PYELOGRAM;  Surgeon: Crista Elliot, MD;  Location: [facility];  Service: Urology;  Laterality: Bilateral;   ERCP  12/04/2019   stent to pancreas done at [facility] for congenital abnormality that can cause pancreatitis   FOOT SURGERY Right 02/2017   bunionectomy   TONSILLECTOMY AND ADENOIDECTOMY     age 71 or 5   UPPER GI ENDOSCOPY  2000   UPPER GI ENDOSCOPY  08/29/2018    OB History   No obstetric history on file.      Home Medications    Prior to Admission medications   Medication Sig Start Date End Date Taking? Authorizing Provider  cephALEXin (KEFLEX) 500 MG capsule Take 1 capsule (500 mg total) by mouth 4 (four) times daily for 7 days. 07/08/23 07/15/23 Yes Radford Pax, NP  acetaminophen (TYLENOL) 325 MG tablet Take  650 mg by mouth every 6 (six) hours as needed for mild pain or headache.    [provider]  AJOVY 225 MG/1.5ML SOSY Inject 225 mg into the skin every 30 (thirty) days. 08/07/19   [provider]  baclofen (LIORESAL) 10 MG tablet Take 10 mg by mouth every 8 (eight) hours as needed. 10/03/21   [provider]  buPROPion (WELLBUTRIN XL) 150 MG 24 hr tablet Take 150 mg by mouth every morning. 12/03/22   [provider]  estradiol-norethindrone (COMBIPATCH) 0.05-0.14 MG/DAY Place 1 patch onto the skin 2 (two) times a week.    [provider]  HYDROcodone-acetaminophen (NORCO/VICODIN) 5-325 MG tablet Take 1 tablet by mouth every 4 (four) hours as needed for moderate pain. 09/17/22 09/17/23  Ray Church III, MD  ibuprofen  (ADVIL) 200 MG tablet Take 200 mg by mouth every 6 (six) hours as needed.    [provider]  levalbuterol Pauline Aus HFA) 45 MCG/ACT inhaler Inhale 2 puffs into the lungs every 8 (eight) hours as needed for wheezing. 08/13/13   Donato Schultz, DO  loratadine (CLARITIN) 10 MG tablet Take 10 mg by mouth daily.    [provider]  Meth-Hyo-M Bl-Na Phos-Ph Sal (URO-MP) 118 MG CAPS Take 2 capsules by mouth every 8 (eight) hours as needed. urabelle 02/02/22   [provider]  methenamine (HIPREX) 1 g tablet Take 1 g by mouth 2 (two) times daily with a meal.    [provider]  methylphenidate 18 MG PO CR tablet Take 27 mg by mouth every morning. Patient not taking: Reported on 09/14/2022 07/20/21   [provider]  Multiple Vitamin (MULTIVITAMIN) tablet Take 1 tablet by mouth daily.    [provider]  Omega-3 Fatty Acids (OMEGA 3 PO) Take by mouth 2 (two) times daily with breakfast and lunch. 2 tabs bid plant based    [provider]  predniSONE (DELTASONE) 20 MG tablet Take 20 mg by mouth daily with breakfast. Will complete on 09/16/22. Patient not taking: Reported on 05/28/2023    [provider]  Pregabalin (LYRICA PO) Take 25 mg by mouth as needed.    [provider]  progesterone (PROMETRIUM) 100 MG capsule Take 100 mg by mouth at bedtime.    [provider]  Ubrogepant 100 MG TABS Take by mouth as needed. ubrevly    [provider]  UNABLE TO FIND Testesterone pellet every 3 months    [provider]    Family History Family History  Problem Relation Age of Onset   Arthritis Mother    Heart disease Mother        Hole in the heart   Hypertension Mother    Cancer Paternal Aunt        brain   Cancer Paternal Grandfather        skin    Social History Social History   Tobacco Use   Smoking status: Never   Smokeless tobacco: Never  Vaping Use   Vaping status: Never Used   Substance Use Topics   Alcohol use: Not Currently   Drug use: No     Allergies   Ciprofloxacin, Sulfa antibiotics, Imitrex [sumatriptan], and Iodinated contrast media   Review of Systems Review of Systems  Genitourinary:        Bladder pressure  Musculoskeletal:  Positive for back pain.     Physical Exam Triage Vital Signs ED Triage Vitals  Encounter Vitals Group  BP 07/08/23 1917 110/70     Systolic BP Percentile --      Diastolic BP Percentile --      Pulse Rate 07/08/23 1917 76     Resp 07/08/23 1917 16     Temp 07/08/23 1917 97.8 F (36.6 C)     Temp Source 07/08/23 1917 Oral     SpO2 07/08/23 1917 97 %     Weight --      Height --      Head Circumference --      Peak Flow --      Pain Score 07/08/23 1916 8     Pain Loc --      Pain Education --      Exclude from Growth Chart --    No data found.  Updated Vital Signs BP 110/70 (BP Location: Right Arm)   Pulse 76   Temp 97.8 F (36.6 C) (Oral)   Resp 16   SpO2 97%   Visual Acuity Right Eye Distance:   Left Eye Distance:   Bilateral Distance:    Right Eye Near:   Left Eye Near:    Bilateral Near:     Physical Exam Vitals and nursing note reviewed.  Constitutional:      Appearance: Normal appearance.  HENT:     Head: Normocephalic and atraumatic.  Eyes:     Pupils: Pupils are equal, round, and reactive to light.  Cardiovascular:     Rate and Rhythm: Normal rate.  Pulmonary:     Effort: Pulmonary effort is normal.  Abdominal:     Tenderness: There is no right CVA tenderness or left CVA tenderness.  Musculoskeletal:     Lumbar back: Tenderness present. No swelling, edema, deformity, signs of trauma, lacerations or bony tenderness. Normal range of motion. Negative right straight leg raise test and negative left straight leg raise test. No scoliosis.       Back:  Skin:    General: Skin is warm and dry.  Neurological:     General: No focal deficit present.     Mental Status: She is  alert and oriented to person, place, and time.  Psychiatric:        Mood and Affect: Mood normal.        Behavior: Behavior normal.      UC Treatments / Results  Labs (all labs ordered are listed, but only abnormal results are displayed) Labs Reviewed  POCT URINALYSIS DIP (MANUAL ENTRY) - Abnormal; Notable for the following components:      Result Value   Leukocytes, UA Trace (*)    All other components within normal limits  URINE CULTURE  POCT URINE PREGNANCY    EKG   Radiology No results found.  Procedures Procedures (including critical care time)  Medications Ordered in UC Medications - No data to display  Initial Impression / Assessment and Plan / UC Course  I have reviewed the triage vital signs and the nursing notes.  Pertinent labs & imaging results that were available during my care of the patient were reviewed by me and considered in my medical decision making (see chart for details).     Reviewed exam and symptoms with patient.  No red flags.  UA with trace leuks, will culture.  Given symptoms and history we will start Keflex 4 times daily for 7 days.  She will follow-up with urology tomorrow for further workup/treatment.  Declines any STD testing at this time.  ER precautions reviewed and  patient verbalized understanding. Final Clinical Impressions(s) / UC Diagnoses   Final diagnoses:  Sensation of pressure in bladder area  Acute bilateral low back pain, unspecified whether sciatica present     Discharge Instructions      The clinic will contact you with results of the urine culture done today if positive.  You may take Keflex 4 times a day for 7 days while awaiting the culture results.  Please follow-up with your urologist at your scheduled appointment tomorrow.  Please go to the ER for any worsening symptoms.  I hope you feel better soon!    ED Prescriptions     Medication Sig Dispense Auth. Provider   cephALEXin (KEFLEX) 500 MG capsule Take 1  capsule (500 mg total) by mouth 4 (four) times daily for 7 days. 28 capsule Radford Pax, NP      PDMP not reviewed this encounter.   Radford Pax, NP 07/08/23 [phone]

## 2023-07-08 NOTE — ED Triage Notes (Signed)
Pt presents to UC w/ c/o urinary pressure x2 weeks.  Hx Interstitial cystitis Back pain starting this morning Fever yesterday Tylenol and ibuprofen for pain relief.

## 2023-07-08 NOTE — Discharge Instructions (Addendum)
The clinic will contact you with results of the urine culture done today if positive.  You may take Keflex 4 times a day for 7 days while awaiting the culture results.  Please follow-up with your urologist at your scheduled appointment tomorrow.  Please go to the ER for any worsening symptoms.  I hope you feel better soon!

## 2023-07-09 DIAGNOSIS — N3946 Mixed incontinence: Secondary | ICD-10-CM | POA: Insufficient documentation

## 2023-07-09 DIAGNOSIS — N3040 Irradiation cystitis without hematuria: Secondary | ICD-10-CM | POA: Insufficient documentation

## 2023-07-09 LAB — URINE CULTURE: Culture: NO GROWTH

## 2023-11-29 ENCOUNTER — Ambulatory Visit
Admission: RE | Admit: 2023-11-29 | Discharge: 2023-11-29 | Disposition: A | Discharge status: Home / Self Care | Source: Ambulatory Visit | Attending: Family Medicine | Admitting: Family Medicine

## 2023-11-29 VITALS — BP 147/83 | HR 69 | Temp 98.0°F | Resp 16

## 2023-11-29 DIAGNOSIS — G43909 Migraine, unspecified, not intractable, without status migrainosus: Secondary | ICD-10-CM | POA: Insufficient documentation

## 2023-11-29 DIAGNOSIS — N3090 Cystitis, unspecified without hematuria: Secondary | ICD-10-CM | POA: Insufficient documentation

## 2023-11-29 LAB — POCT URINALYSIS DIP (MANUAL ENTRY)
Bilirubin, UA: NEGATIVE
Glucose, UA: NEGATIVE mg/dL
Ketones, POC UA: NEGATIVE mg/dL
Nitrite, UA: POSITIVE — AB
Protein Ur, POC: NEGATIVE mg/dL
Spec Grav, UA: 1.015
Urobilinogen, UA: 2 U/dL — AB
pH, UA: 6

## 2023-11-29 MED ORDER — NITROFURANTOIN MONOHYD MACRO 100 MG PO CAPS: 100.0000 mg | ORAL_CAPSULE | Freq: Two times a day (BID) | ORAL | 0 refills | Status: AC

## 2023-11-29 NOTE — ED Provider Notes (Signed)
 [name]    CSN: [id number] Arrival date & time: 11/29/23  1524      History   Chief Complaint Chief Complaint  Patient presents with   Urinary Frequency    Entered by patient    HPI Zoe Briggs is a 48 y.o. female.    Urinary Frequency  Patient is here for UTI symptoms x 2 days.  Having urinary odor, mild frequency, mild urgency.  No fevers/chills.  No abd pain or back pain.  She does have h/o UTI's, but has been a while.  Usually gets a strong odor.        Past Medical History:  Diagnosis Date   ADD (attention deficit  disorder)    Follows w/ [facility].   Allergy    Asthma    Seasonal, Follows w/ Abran Duke, PA.   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   Sciatica    right leg   Wears glasses     Patient Active Problem List   Diagnosis Date Noted   Migraines 11/29/2023   Chronic radiation cystitis 07/09/2023   Urinary incontinence, mixed 07/09/2023   Hypertension 03/06/2023   Other fatigue 03/06/2023   Palpitations 03/06/2023   Snoring 03/06/2023   SOB (shortness of breath) 03/06/2023   Blood pressure elevated without history of HTN 03/06/2023   Vegan diet 12/21/2022   Peripheral polyneuropathy 12/04/2021   Right leg pain 12/04/2021   Early menopause 07/04/2021   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   Hand pain, right  12/18/2019   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   Acute pancreatitis 08/22/2019   Vitamin B 12 deficiency 08/10/2019   Vitamin D deficiency 08/10/2019   Recurrent major depressive disorder, 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/2017   Leukopenia 08/26/2017   History of creation of ostomy (HCC) 07/17/2017   Interstitial cystitis 03/16/2015   Hair loss 08/18/2013   Recurrent UTI 11/04/2012   ADD (attention deficit disorder) 07/08/2012   Asthma 05/27/2012   Depression 05/27/2012   Colon cancer (HCC) 05/27/2012   Seasonal allergies 05/27/2012   History of rectal cancer 02/25/2012   Abnormal abdominal CT scan 02/25/2012    Past Surgical History:  Procedure Laterality Date   COLON SURGERY  2006   colonscopy     2012 and 08-28-2018   colonscopy  2005   CYSTOSCOPY WITH HYDRODISTENSION AND BIOPSY Bilateral 09/17/2022   Procedure: CYSTOSCOPY/BIOPSY/HYDRODISTENSION FULGURATION BILATERAL RETROGRADE PYELOGRAM;  Surgeon: Crista Elliot, MD;  Location: [facility];  Service: Urology;  Laterality: Bilateral;   ERCP  12/04/2019   stent to pancreas done at [place] for congenital abnormality that can cause pancreatitis   FOOT SURGERY Right 02/2017   bunionectomy   TONSILLECTOMY AND ADENOIDECTOMY  age 79 or 5   UPPER GI ENDOSCOPY  2000   UPPER GI ENDOSCOPY  08/29/2018    OB History   No obstetric history on file.      Home Medications    Prior to Admission medications   Medication Sig Start Date End Date Taking? Authorizing Provider  acetaminophen (TYLENOL) 325 MG tablet Take 650 mg by mouth every 6 (six) hours as needed for mild pain or headache.    [provider]  AJOVY 225 MG/1.5ML SOSY Inject 225 mg into the skin every 30 (thirty) days. 08/07/19   [provider]  baclofen (LIORESAL) 10 MG tablet Take 10 mg by mouth every 8 (eight) hours as needed. 10/03/21   [provider]  buPROPion (WELLBUTRIN XL) 150 MG 24 hr tablet Take 150 mg by  mouth every morning. 12/03/22   [provider]  Cetirizine HCl 10 MG CAPS Take by mouth.    [provider]  estradiol-norethindrone [facility]) 0.05-0.14 MG/DAY Place 1 patch onto the skin 2 (two) times a week.    [provider]  ibuprofen (ADVIL) 200 MG tablet Take 200 mg by mouth every 6 (six) hours as needed.    [provider]  levalbuterol Pauline Aus HFA) 45 MCG/ACT inhaler Inhale 2 puffs into the lungs every 8 (eight) hours as needed for wheezing. 08/13/13   Donato Schultz, DO  loratadine (CLARITIN) 10 MG tablet Take 10 mg by mouth daily.    [provider]  meclizine (ANTIVERT) 25 MG tablet TAKE 1 TABLET BY MOUTH 3 (THREE) TIMES A DAY AS NEEDED FOR DIZZINESS (VERTIGO) FOR UP TO 10 DAYS.    [provider]  Meth-Hyo-M Bl-Na Phos-Ph Sal (URO-MP) 118 MG CAPS Take 2 capsules by mouth every 8 (eight) hours as needed. urabelle 02/02/22   [provider]  methenamine (HIPREX) 1 g tablet Take 1 g by mouth 2 (two) times daily with a meal.    [provider]  methylphenidate 18 MG PO CR tablet Take 27 mg by mouth every morning. Patient not taking: Reported on 09/14/2022 07/20/21   [provider]  Multiple Vitamin (MULTIVITAMIN) tablet Take 1 tablet by mouth daily.    [provider]  Omega-3 Fatty Acids (OMEGA 3 PO) Take by mouth 2 (two) times daily with breakfast and lunch. 2 tabs bid plant based    [provider]  predniSONE (DELTASONE) 20 MG tablet Take 20 mg by mouth daily with breakfast. Will complete on 09/16/22. Patient not taking: Reported on 05/28/2023    [provider]  Pregabalin (LYRICA PO) Take 25 mg by mouth as needed.    [provider]  progesterone (PROMETRIUM) 100 MG capsule Take 100 mg by mouth at bedtime.    [provider]  Ubrogepant 100 MG TABS Take by mouth as needed. ubrevly    [provider]  UNABLE TO FIND Testesterone pellet every 3  months    [provider]    Family History Family History  Problem Relation Age of Onset   Arthritis Mother    Heart disease Mother        Thayer Jew in the heart   Hypertension Mother    Cancer Paternal Aunt        brain   Cancer Paternal Grandfather        skin    Social History Social History   Tobacco Use   Smoking status: Never   Smokeless tobacco: Never  Vaping Use  Vaping status: Never Used  Substance Use Topics   Alcohol use: Not Currently   Drug use: No     Allergies   Ciprofloxacin, Sulfa antibiotics, Imitrex [sumatriptan], and Iodinated contrast media   Review of Systems Review of Systems  Constitutional: Negative.   HENT: Negative.    Respiratory: Negative.    Cardiovascular: Negative.   Gastrointestinal: Negative.   Genitourinary:  Positive for frequency.  Musculoskeletal: Negative.   Psychiatric/Behavioral: Negative.       Physical Exam Triage Vital Signs ED Triage Vitals  Encounter Vitals Group     BP 11/29/23 1558 (!) 147/83     Systolic BP Percentile --      Diastolic BP Percentile --      Pulse Rate 11/29/23 1558 69     Resp 11/29/23 1558 16     Temp 11/29/23 1558 98 F (36.7 C)     Temp Source 11/29/23 1558 Oral     SpO2 11/29/23 1558 99 %     Weight --      Height --      Head Circumference --      Peak Flow --      Pain Score 11/29/23 1550 0     Pain Loc --      Pain Education --      Exclude from Growth Chart --    No data found.  Updated Vital Signs BP (!) 147/83 (BP Location: Left Arm)   Pulse 69   Temp 98 F (36.7 C) (Oral)   Resp 16   SpO2 99%   Visual Acuity Right Eye Distance:   Left Eye Distance:   Bilateral Distance:    Right Eye Near:   Left Eye Near:    Bilateral Near:     Physical Exam Constitutional:      Appearance: Normal appearance.  Cardiovascular:     Rate and Rhythm: Normal rate and regular rhythm.  Pulmonary:     Effort: Pulmonary effort is normal.     Breath sounds: Normal  breath sounds.  Abdominal:     Palpations: Abdomen is soft.     Tenderness: There is no abdominal tenderness. There is no right CVA tenderness, left CVA tenderness, guarding or rebound.  Neurological:     General: No focal deficit present.     Mental Status: She is alert.  Psychiatric:        Mood and Affect: Mood normal.      UC Treatments / Results  Labs (all labs ordered are listed, but only abnormal results are displayed) Labs Reviewed  POCT URINALYSIS DIP (MANUAL ENTRY) - Abnormal; Notable for the following components:      Result Value   Clarity, UA cloudy (*)    Blood, UA trace-intact (*)    Urobilinogen, UA 2.0 (*)    Nitrite, UA Positive (*)    Leukocytes, UA Small (1+) (*)    All other components within normal limits  URINE CULTURE    EKG   Radiology No results found.  Procedures Procedures (including critical care time)  Medications Ordered in UC Medications - No data to display  Initial Impression / Assessment and Plan / UC Course  I have reviewed the triage vital signs and the nursing notes.  Pertinent labs & imaging results that were available during my care of the patient were reviewed by me and considered in my medical decision making (see chart for details).   Final Clinical Impressions(s) / UC Diagnoses   Final  diagnoses:  Cystitis     Discharge Instructions      Your urine does appear positive for a urinary tract infection.  I will send to the lab as well for verification.  In the mean time I have sent out an antibiotic to take twice/day x 7 days.  Please increase fluids as well.  Follow up if not improving as expected.     ED Prescriptions     Medication Sig Dispense Auth. Provider   nitrofurantoin, macrocrystal-monohydrate, (MACROBID) 100 MG capsule Take 1 capsule (100 mg total) by mouth 2 (two) times daily for 7 days. 14 capsule Jannifer Franklin, MD      PDMP not reviewed this encounter.   Jannifer Franklin, MD 11/29/23 [phone]

## 2023-11-29 NOTE — Discharge Instructions (Signed)
 Your urine does appear positive for a urinary tract infection.  I will send to the lab as well for verification.  In the mean time I have sent out an antibiotic to take twice/day x 7 days.  Please increase fluids as well.  Follow up if not improving as expected.

## 2023-11-29 NOTE — ED Triage Notes (Signed)
 Pt presents with UTI symptoms that onset about 48 hours. Pt reports a little urgency, frequency, and strong odor. Pt denies abdominal pain.

## 2023-12-02 LAB — URINE CULTURE: Culture: >=100000 — AB

## 2023-12-20 ENCOUNTER — Other Ambulatory Visit: Payer: Self-pay | Admitting: Internal Medicine

## 2023-12-20 ENCOUNTER — Encounter: Payer: Self-pay | Admitting: Internal Medicine

## 2023-12-20 ENCOUNTER — Ambulatory Visit: Admitting: Internal Medicine

## 2023-12-20 ENCOUNTER — Other Ambulatory Visit: Payer: Self-pay

## 2023-12-20 VITALS — BP 104/64 | HR 98 | Temp 98.1°F | Ht 70.0 in | Wt 158.0 lb

## 2023-12-20 DIAGNOSIS — J4520 Mild intermittent asthma, uncomplicated: Secondary | ICD-10-CM

## 2023-12-20 DIAGNOSIS — J310 Chronic rhinitis: Secondary | ICD-10-CM | POA: Diagnosis not present

## 2023-12-20 MED ORDER — AZELASTINE HCL 0.1 % NA SOLN: 2.0000 | Freq: Two times a day (BID) | NASAL | 5 refills | Status: DC | PRN

## 2023-12-20 MED ORDER — LEVOCETIRIZINE DIHYDROCHLORIDE 5 MG PO TABS: 5.0000 mg | ORAL_TABLET | Freq: Every evening | ORAL | 5 refills | Status: AC

## 2023-12-20 MED ORDER — LEVALBUTEROL TARTRATE 45 MCG/ACT IN AERO: 2.0000 | INHALATION_SPRAY | Freq: Three times a day (TID) | RESPIRATORY_TRACT | 1 refills | Status: AC | PRN

## 2023-12-20 MED ORDER — MONTELUKAST SODIUM 10 MG PO TABS: 10.0000 mg | ORAL_TABLET | Freq: Every day | ORAL | 5 refills | Status: DC

## 2023-12-20 NOTE — Progress Notes (Signed)
 FOLLOW UP Date of Service/Encounter:  12/20/23   Subjective:  Zoe Briggs (DOB: 08/11/1976) is a 48 y.o. female who [facility] for follow up for asthma and chronic rhinitis.   History obtained from: chart review and patient. Last visit was with Dr Lucie Leather on 10/30/2021 for asthma and allergic rhinitis and at the time, SPT and ID were negative for aeroallergens and commonly allergenic foods. Discussed blood testing.  Also try Ryaltris, PRN Xopenex, PRN anti histamine, PRN pataday.    Since last visit, reports asthma was doing okay until she got sick about 2 weeks ago, starting with a sinus infection but then started having wheezing/dyspnea.  Negative COVID/Flu/RSV.  CXR normal. Seen in ED and given azithromycin/tessalon.  Seen by PCP next day due to persistent cough and started on prednisone taper.  Feeling better since then but still has congestion and post nasal drip.  Breathing is back to normal.  Still has a cough though.  Unable to tolerate albuterol due to tachycardia and palpitations with use and instead uses PRN Xopenex which is not very often. No other urgent care/ER visits/oral prednisone in few years.   Does note chronic trouble with congestion and post nasal drip and worse the past few years.  Previously did AIT about 15 years ago for about 1-1.5 years and considering starting it again.  On Allegra daily, Zyrtec/Claritin did not work.  Uses Flonase Sensimist and Azelastine PRN. Used to be on Singulair many years ago, no side effects noted.    Past Medical History: Past Medical History:  Diagnosis Date   ADD (attention deficit  disorder)    Follows w/ [facility].   Allergy    Asthma    Seasonal, Follows w/ Abran Duke, PA.   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 Physical Exam: GEN: alert, well developed HEENT: clear conjunctiva, nose with mild inferior turbinate hypertrophy, pink nasal mucosa, + clear rhinorrhea, + cobblestoning HEART: regular rate and rhythm, no murmur LUNGS: clear to auscultation bilaterally, no coughing, unlabored respiration SKIN: no rashes or lesions   Spirometry:  Tracings reviewed. Her effort: Good reproducible efforts. FVC: 4.44L, 104% predicted  FEV1: 3.34L, 99% predicted FEV1/FVC ratio: 75% Interpretation: Spirometry consistent with normal pattern.  Please see scanned spirometry results for details.  Assessment:   1. Chronic rhinitis   2. Mild intermittent asthma without complication     Plan/Recommendations:  Mild Intermittent Asthma: - MDI technique discussed.  Spirometry today was normal.  Cough is likely post infectious and should improve with time.  Overall controlled, only 1 flare up over the past few years requiring oral prednisone recently.  - Rescue inhaler: Xopenex 2 puffs every 4-6 hours as needed for respiratory symptoms of shortness of breath, or wheezing Asthma control goals:  Full participation in all desired activities (may need albuterol before activity) Albuterol  use two times or less a week on average (not counting use with activity) Cough interfering with sleep two times or less a month Oral steroids no more than once a year No hospitalizations  Chronic Rhinitis: - Uncontrolled, discussed retesting as symptoms have continued to worsen over the years.  Will repeat skin testing + IDs for environmental and if negative, obtain blood testing for aeroallergens.  If both are negative, she has chronic/non allergic rhinitis.  Does not like nose sprays due to drying effect but can consider Ipratropium if excessive PND.   She wishes to hold off until Summer for testing.  - Use nasal saline rinses before nose sprays such as with Neilmed Sinus Rinse.  Use distilled water.   - Use Flonase Sensimist 1-2 sprays each nostril daily. Aim upward and outward. - Use Azelastine 2 sprays each nostril twice daily as needed for runny nose, drainage, sneezing, congestion. Aim upward and outward. - Use  Allegra 180mg  or Xyzal 5mg  daily.  - Use Singulair 10mg  daily.  Stop if there are any mood/behavioral changes.   Follow up: 2-3 months with NP for environmental skin testing and IDs     Return in about 2 months (around 02/19/2024).  Alesia Morin, MD [facility]

## 2023-12-20 NOTE — Patient Instructions (Addendum)
 Mild Intermittent Asthma: - Rescue inhaler: Xopenex 2 puffs every 4-6 hours as needed for respiratory symptoms of shortness of breath, or wheezing Asthma control goals:  Full participation in all desired activities (may need albuterol before activity) Albuterol use two times or less a week on average (not counting use with activity) Cough interfering with sleep two times or less a month Oral steroids no more than once a year No hospitalizations  Chronic Rhinitis: - Use nasal saline rinses before nose sprays such as with Neilmed Sinus Rinse.  Use distilled water.   - Use Flonase Sensimist 1-2 sprays each nostril daily. Aim upward and outward. - Use Azelastine 2 sprays each nostril twice daily as needed for runny nose, drainage, sneezing, congestion. Aim upward and outward. - Use  Allegra 180mg  or Xyzal 5mg  daily.  - Use Singulair 10mg  daily.  Stop if there are any mood/behavioral changes.  Hold all anti-histamines (Azelastine, Xyzal, Allegra, Zyrtec, Claritin, Benadryl, Pepcid) 3 days prior to next visit.  Follow up: 2-3 months with NP for repeat environmental testing

## 2023-12-24 ENCOUNTER — Telehealth: Admitting: Family Medicine

## 2023-12-24 DIAGNOSIS — N390 Urinary tract infection, site not specified: Secondary | ICD-10-CM | POA: Diagnosis not present

## 2023-12-24 MED ORDER — SULFAMETHOXAZOLE-TRIMETHOPRIM 800-160 MG PO TABS
1.0000 | ORAL_TABLET | Freq: Two times a day (BID) | ORAL | 0 refills | Status: AC
Start: 2023-12-24 — End: 2023-12-31

## 2023-12-24 NOTE — Patient Instructions (Signed)
 Zoe Briggs, thank you for joining Freddy Finner, NP for today's virtual visit.  While this provider is not your primary care provider (PCP), if your PCP is located in our provider database this encounter information will be shared with them immediately following your visit.   A [place] MyChart account gives you access to today's visit and all your visits, tests, and labs performed at [facility] " click here if you don't have a [place] MyChart account or go to [email or web address]  Consent: (Patient) Zoe Briggs provided verbal consent for this virtual visit at the beginning of the encounter.  Current Medications:  Current Outpatient Medications:    sulfamethoxazole-trimethoprim (BACTRIM DS) 800-160 MG tablet, Take 1 tablet by mouth 2 (two) times daily for 7 days., Disp: 14 tablet, Rfl: 0   acetaminophen (TYLENOL) 325 MG tablet, Take 650 mg by mouth every 6 (six) hours as needed for mild pain or headache., Disp: , Rfl:    AJOVY 225 MG/1.5ML SOSY, Inject 225 mg into the skin every 30 (thirty) days., Disp: , Rfl:    azelastine (ASTELIN) 0.1 % nasal spray, Place 2 sprays into both nostrils 2 (two) times daily as needed for rhinitis., Disp: 30 mL, Rfl: 5   baclofen (LIORESAL) 10 MG tablet, Take 10 mg by mouth every 8 (eight) hours as needed. (Patient not taking: Reported on 12/20/2023), Disp: , Rfl:    buPROPion (WELLBUTRIN XL) 150 MG 24 hr tablet, Take 150 mg by mouth every morning. (Patient not taking: Reported on 12/20/2023), Disp: , Rfl:    estradiol-norethindrone (COMBIPATCH) 0.05-0.14 MG/DAY, Place 1 patch onto the skin 2 (two) times a week., Disp: , Rfl:    ibuprofen (ADVIL) 200 MG tablet, Take 200 mg by mouth every 6 (six) hours as needed., Disp: , Rfl:    levalbuterol (XOPENEX HFA) 45 MCG/ACT inhaler, Inhale 2 puffs into the lungs every 8 (eight) hours as needed for wheezing or shortness of breath., Disp: 1 each, Rfl: 1   levocetirizine (XYZAL) 5 MG tablet,  Take 1 tablet (5 mg total) by mouth every evening., Disp: 30 tablet, Rfl: 5   meclizine (ANTIVERT) 25 MG tablet, TAKE 1 TABLET BY MOUTH 3 (THREE) TIMES A DAY AS NEEDED FOR DIZZINESS (VERTIGO) FOR UP TO 10 DAYS., Disp: , Rfl:    Meth-Hyo-M Bl-Na Phos-Ph Sal (URO-MP) 118 MG CAPS, Take 2 capsules by mouth every 8 (eight) hours as needed. urabelle, Disp: , Rfl:    montelukast (SINGULAIR) 10 MG tablet, Take 1 tablet (10 mg total) by mouth at bedtime., Disp: 30 tablet, Rfl: 5   Multiple Vitamin (MULTIVITAMIN) tablet, Take 1 tablet by mouth daily., Disp: , Rfl:    Omega-3 Fatty Acids (OMEGA 3 PO), Take by mouth 2 (two) times daily with breakfast and lunch. 2 tabs bid plant based, Disp: , Rfl:    Pregabalin (LYRICA PO), Take 25 mg by mouth as needed., Disp: , Rfl:    progesterone (PROMETRIUM) 100 MG capsule, Take 100 mg by mouth at bedtime., Disp: , Rfl:    Ubrogepant 100 MG TABS, Take by mouth as needed. ubrevly, Disp: , Rfl:    UNABLE TO FIND, Testesterone pellet every 3 months, Disp: , Rfl:    Medications ordered in this encounter:  Meds ordered this encounter  Medications   sulfamethoxazole-trimethoprim (BACTRIM DS) 800-160 MG tablet    Sig: Take 1 tablet by mouth 2 (two) times daily for 7 days.    Dispense:  14 tablet    Refill:  0    Supervising Provider:   Corine Dice [[id number]]     *If you need refills on other medications prior to your next appointment, please contact your pharmacy*  Follow-Up: Call back or seek an in-person evaluation if the symptoms worsen or if the condition fails to improve as anticipated.  [facility] [phone]  Other Instructions    If you have been instructed to have an in-person evaluation today at a local Urgent Care facility, please use the link below. It will take you to a list of all of our available [place] [facility], including address, phone number and hours of operation. Please do not delay care.  [facility]  If you or a family member do not have a primary care provider, use the link below to schedule a visit and establish care. When you choose a [place] primary care physician or advanced practice provider, you gain a long-term partner in health. Find a Primary Care Provider  Learn more about 's in-office and virtual care options:  - Get Care Now

## 2023-12-24 NOTE — Progress Notes (Signed)
 Virtual Visit Consent   Zoe Briggs, you are scheduled for a virtual visit with a [facility] provider today. Just as with appointments in the office, your consent must be obtained to participate. Your consent will be active for this visit and any virtual visit you may have with one of our providers in the next 365 days. If you have a MyChart account, a copy of this consent can be sent to you electronically.  As this is a virtual visit, video technology does not allow for your provider to perform a traditional examination. This may limit your provider's ability to fully assess your condition. If your provider identifies any concerns that need to be evaluated in person or the need to arrange testing (such as labs, EKG, etc.), we will make arrangements to do so. Although advances in technology are sophisticated, we cannot ensure that it will always work on either your end or our end. If the connection with a video visit is poor, the visit may have to be switched to a telephone visit. With either a video or telephone visit, we are not always able to ensure that we have a secure connection.  By engaging in this virtual visit, you consent to the provision of healthcare and authorize for your insurance to be billed (if applicable) for the services provided during this visit. Depending on your insurance coverage, you may receive a charge related to this service.  I need to obtain your verbal consent now. Are you willing to proceed with your visit today? Zoe Briggs has provided verbal consent on 12/24/2023 for a virtual visit (video or telephone). Freddy Finner, NP  Date: 12/24/2023 1:34 PM   Virtual Visit via Video Note   I, Freddy Finner, connected with  Zoe Briggs  ([id number], 06/04/1976) on 12/24/23 at  1:30 PM EDT by a video-enabled telemedicine application and verified that I am speaking with the correct person using two identifiers.  Location: Patient: Virtual Visit Location Patient:  Home Provider: Virtual Visit Location Provider: Home Office   I discussed the limitations of evaluation and management by telemedicine and the availability of in person appointments. The patient expressed understanding and agreed to proceed.    History of Present Illness: Zoe Briggs is a 48 y.o. who identifies as a female who was assigned female at birth, and is being seen today for recurrent UTI  Has chronic radiation cystitis, IC, and recurrent UTI. Onset was last week- 4-5 days ago for this one. Was treated on 11/29/23 with Macrobid- sensitive e coli.  Symptoms including burning, odor same has previous, increased frequency  Denies pelvic pain, n/v, fevers, chills    Problems:  Patient Active Problem List   Diagnosis Date Noted   Migraines 11/29/2023   Chronic radiation cystitis 07/09/2023   Urinary incontinence, mixed 07/09/2023   Hypertension 03/06/2023   Other fatigue 03/06/2023   Palpitations 03/06/2023   Snoring 03/06/2023   SOB (shortness of breath) 03/06/2023   Blood pressure elevated without history of HTN 03/06/2023   Vegan diet 12/21/2022   Peripheral polyneuropathy 12/04/2021   Right leg pain 12/04/2021   Early menopause 07/04/2021   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   Hand pain, right 12/18/2019   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   Acute pancreatitis 08/22/2019   Vitamin B 12 deficiency 08/10/2019   Vitamin D deficiency 08/10/2019   Recurrent major depressive disorder, 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/2017   Leukopenia 08/26/2017   History of creation of ostomy (HCC) 07/17/2017  Interstitial cystitis 03/16/2015   Hair loss 08/18/2013   Recurrent UTI 11/04/2012   ADD (attention deficit disorder) 07/08/2012   Asthma 05/27/2012   Depression 05/27/2012   Colon cancer (HCC) 05/27/2012   Seasonal allergies 05/27/2012   History of rectal cancer 02/25/2012   Abnormal abdominal CT scan 02/25/2012    Allergies:  Allergies  Allergen Reactions    Ciprofloxacin Nausea Only   Sulfa Antibiotics Other (See Comments)    "Burns  stomach"   Imitrex [Sumatriptan] Rash   Iodinated Contrast Media Rash    Only when given together with chemo   Medications:  Current Outpatient Medications:    acetaminophen (TYLENOL) 325 MG tablet, Take 650 mg by mouth every 6 (six) hours as needed for mild pain or headache., Disp: , Rfl:    AJOVY 225 MG/1.5ML SOSY, Inject 225 mg into the skin every 30 (thirty) days., Disp: , Rfl:    azelastine (ASTELIN) 0.1 % nasal spray, Place 2 sprays into both nostrils 2 (two) times daily as needed for rhinitis., Disp: 30 mL, Rfl: 5   baclofen (LIORESAL) 10 MG tablet, Take 10 mg by mouth every 8 (eight) hours as needed. (Patient not taking: Reported on 12/20/2023), Disp: , Rfl:    buPROPion (WELLBUTRIN XL) 150 MG 24 hr tablet, Take 150 mg by mouth every morning. (Patient not taking: Reported on 12/20/2023), Disp: , Rfl:    estradiol-norethindrone (COMBIPATCH) 0.05-0.14 MG/DAY, Place 1 patch onto the skin 2 (two) times a week., Disp: , Rfl:    ibuprofen (ADVIL) 200 MG tablet, Take 200 mg by mouth every 6 (six) hours as needed., Disp: , Rfl:    levalbuterol (XOPENEX HFA) 45 MCG/ACT inhaler, Inhale 2 puffs into the lungs every 8 (eight) hours as needed for wheezing or shortness of breath., Disp: 1 each, Rfl: 1   levocetirizine (XYZAL) 5 MG tablet, Take 1 tablet (5 mg total) by mouth every evening., Disp: 30 tablet, Rfl: 5   meclizine (ANTIVERT) 25 MG tablet, TAKE 1 TABLET BY MOUTH 3 (THREE) TIMES A DAY AS NEEDED FOR DIZZINESS (VERTIGO) FOR UP TO 10 DAYS., Disp: , Rfl:    Meth-Hyo-M Bl-Na Phos-Ph Sal (URO-MP) 118 MG CAPS, Take 2 capsules by mouth every 8 (eight) hours as needed. urabelle, Disp: , Rfl:    methenamine (HIPREX) 1 g tablet, Take 1 g by mouth 2 (two) times daily with a meal. (Patient not taking: Reported on 12/20/2023), Disp: , Rfl:    montelukast (SINGULAIR) 10 MG tablet, Take 1 tablet (10 mg total) by mouth at bedtime.,  Disp: 30 tablet, Rfl: 5   Multiple Vitamin (MULTIVITAMIN) tablet, Take 1 tablet by mouth daily., Disp: , Rfl:    Omega-3 Fatty Acids (OMEGA 3 PO), Take by mouth 2 (two) times daily with breakfast and lunch. 2 tabs bid plant based, Disp: , Rfl:    Pregabalin (LYRICA PO), Take 25 mg by mouth as needed., Disp: , Rfl:    progesterone (PROMETRIUM) 100 MG capsule, Take 100 mg by mouth at bedtime., Disp: , Rfl:    Ubrogepant 100 MG TABS, Take by mouth as needed. ubrevly, Disp: , Rfl:    UNABLE TO FIND, Testesterone pellet every 3 months, Disp: , Rfl:   Observations/Objective: Patient is well-developed, well-nourished in no acute distress.  Resting comfortably  at home.  Head is normocephalic, atraumatic.  No labored breathing.  Speech is clear and coherent with logical content.  Patient is alert and oriented at baseline.    Assessment and Plan:  1. Recurrent UTI (Primary)  - sulfamethoxazole-trimethoprim (BACTRIM DS) 800-160 MG tablet; Take 1 tablet by mouth 2 (two) times daily for 7 days.  Dispense: 14 tablet; Refill: 0   -no other red flags for stone or kidney infection -increase fluids -has appt with Urology coming up in June, might need testing for colonization  -complete medication as discussed -prevention discussed and on AVS    Reviewed side effects, risks and benefits of medication.    Patient acknowledged agreement and understanding of the plan.   Past Medical, Surgical, Social History, Allergies, and Medications have been Reviewed.      Follow Up Instructions: I discussed the assessment and treatment plan with the patient. The patient was provided an opportunity to ask questions and all were answered. The patient agreed with the plan and demonstrated an understanding of the instructions.  A copy of instructions were sent to the patient via MyChart unless otherwise noted below.    The patient was advised to call back or seek an in-person evaluation if the symptoms  worsen or if the condition fails to improve as anticipated.    Lanetta Pion, NP

## 2024-01-08 ENCOUNTER — Telehealth: Payer: Self-pay

## 2024-01-08 NOTE — Telephone Encounter (Signed)
 Patient called in stating she is wanting a PA completed on Xopenex . She states she keeps being suggested another one but she wants Xopenex . She was using [facility] but they quit paying for it so now she needs a PA through her insurance. Before sending to PA team I am checking with the provider that Xopenex  is the correct medication for the patient.

## 2024-01-14 ENCOUNTER — Ambulatory Visit: Admitting: Podiatry

## 2024-01-27 ENCOUNTER — Ambulatory Visit
Admission: RE | Admit: 2024-01-27 | Discharge: 2024-01-27 | Disposition: A | Discharge status: Home / Self Care | Source: Ambulatory Visit | Attending: Physician Assistant | Admitting: Physician Assistant

## 2024-01-27 VITALS — BP 117/78 | HR 69 | Temp 97.8°F | Resp 18

## 2024-01-27 DIAGNOSIS — R350 Frequency of micturition: Secondary | ICD-10-CM | POA: Diagnosis present

## 2024-01-27 LAB — POCT URINALYSIS DIP (MANUAL ENTRY)
Bilirubin, UA: NEGATIVE
Blood, UA: NEGATIVE
Glucose, UA: NEGATIVE mg/dL
Ketones, POC UA: NEGATIVE mg/dL
Leukocytes, UA: NEGATIVE
Nitrite, UA: NEGATIVE
Protein Ur, POC: NEGATIVE mg/dL
Spec Grav, UA: 1.01 (ref 1.010–1.025)
Urobilinogen, UA: 0.2 U/dL
pH, UA: 6.5 (ref 5.0–8.0)

## 2024-01-27 NOTE — ED Triage Notes (Signed)
 Pt reports urinary frequency x7 days and has noticed cloudy urine. Pt does report hx of interstitial cystitis as well so is unsure if that is the cause or if it a UTI. Pt has taken Uribel  2-3x yesterday. Pt also reports suprapubic pain.

## 2024-01-27 NOTE — ED Provider Notes (Signed)
 [name]    CSN: [id number] Arrival date & time: 01/27/24  1159      History   Chief Complaint Chief Complaint  Patient presents with   Urinary Frequency    Entered by patient    HPI Keyry Iracheta is a 48 y.o. female.   Patient here today for evaluation of urinary frequency she has had for 7 days.  She reports some cloudy urine as well.  She has known interstitial cystitis but wanted to rule out UTI.  She has taken Uribel  starting yesterday.  She reports some suprapubic pain but no other abdominal pain or back pain.  She has not had any fever.  The history is provided by the patient.  Urinary Frequency Pertinent negatives include no abdominal pain and no shortness of breath.    Past Medical History:  Diagnosis Date   ADD (attention deficit  disorder)    Follows w/ [facility].   Allergy    Asthma    Seasonal, Follows w/ Anniece Base, PA.   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   Sciatica    right leg   Wears glasses     Patient Active Problem List   Diagnosis Date Noted   Migraines 11/29/2023   Chronic radiation cystitis 07/09/2023   Urinary incontinence, mixed 07/09/2023   Hypertension 03/06/2023   Other fatigue 03/06/2023   Palpitations 03/06/2023   Snoring 03/06/2023   SOB (shortness of breath) 03/06/2023   Blood pressure elevated without  history of HTN 03/06/2023   Vegan diet 12/21/2022   Peripheral polyneuropathy 12/04/2021   Right leg pain 12/04/2021   Early menopause 07/04/2021   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   Hand pain, right 12/18/2019   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   Acute pancreatitis 08/22/2019   Vitamin B 12 deficiency 08/10/2019   Vitamin D deficiency 08/10/2019   Recurrent major depressive disorder, 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/2017   Leukopenia 08/26/2017   History of creation of ostomy (HCC) 07/17/2017   Interstitial cystitis 03/16/2015   Hair loss 08/18/2013   Recurrent UTI 11/04/2012   ADD (attention deficit disorder) 07/08/2012   Asthma 05/27/2012   Depression 05/27/2012   Colon cancer (HCC) 05/27/2012   Seasonal allergies 05/27/2012   History of rectal cancer 02/25/2012   Abnormal abdominal CT scan 02/25/2012    Past Surgical History:  Procedure Laterality Date   COLON SURGERY  2006   colonscopy     2012 and 08-28-2018   colonscopy  2005   CYSTOSCOPY WITH HYDRODISTENSION AND BIOPSY Bilateral 09/17/2022   Procedure: CYSTOSCOPY/BIOPSY/HYDRODISTENSION FULGURATION BILATERAL RETROGRADE PYELOGRAM;  Surgeon: Samson Croak, MD;  Location: [facility];  Service: Urology;  Laterality: Bilateral;   ERCP  12/04/2019  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      Home Medications    Prior to Admission medications   Medication Sig Start Date End Date Taking? Authorizing Provider  acetaminophen  (TYLENOL ) 325 MG tablet Take 650 mg by mouth every 6 (six) hours as needed for mild pain or headache.   Yes [provider]  azelastine  (ASTELIN ) 0.1 % nasal spray Place 2 sprays into both nostrils 2  (two) times daily as needed for rhinitis. 12/20/23 12/19/24 Yes Kandice Orleans, MD  celecoxib (CELEBREX) 200 MG capsule Take 200 mg by mouth 2 (two) times daily as needed. 01/15/24  Yes [provider]  ELMIRON 100 MG capsule Take 100 mg by mouth 3 (three) times daily.   Yes [provider]  estradiol -norethindrone  (COMBIPATCH) 0.05-0.14 MG/DAY Place 1 patch onto the skin 2 (two) times a week.   Yes [provider]  fluticasone  (FLONASE ) 50 MCG/ACT nasal spray INSTILL 1 SPRAY IN EACH NOSTRIL DAILY.   Yes [provider]  ibuprofen (ADVIL) 200 MG tablet Take 200 mg by mouth every 6 (six) hours as needed.   Yes [provider]  levalbuterol  (XOPENEX  HFA) 45 MCG/ACT inhaler Inhale 2 puffs into the lungs every 8 (eight) hours as needed for wheezing or shortness of breath. 12/20/23  Yes Kandice Orleans, MD  levocetirizine (XYZAL ) 5 MG tablet Take 1 tablet (5 mg total) by mouth every evening. 12/20/23  Yes Kandice Orleans, MD  metoprolol tartrate (LOPRESSOR) 25 MG tablet Take by mouth.   Yes [provider]  Multiple Vitamin (MULTIVITAMIN) tablet Take 1 tablet by mouth daily.   Yes [provider]  Omega-3 Fatty Acids (OMEGA 3 PO) Take by mouth 2 (two) times daily with breakfast and lunch. 2 tabs bid plant based   Yes [provider]  Pregabalin (LYRICA PO) Take 25 mg by mouth as needed.   Yes [provider]  progesterone (PROMETRIUM) 100 MG capsule Take 100 mg by mouth at bedtime.   Yes [provider]  traMADol  (ULTRAM ) 50 MG tablet Take 50 mg by mouth 2 (two) times daily.   Yes [provider]  AJOVY 225 MG/1.5ML SOSY Inject 225 mg into the skin every 30 (thirty) days. Patient not taking: Reported on 01/27/2024 08/07/19   [provider]  buPROPion (WELLBUTRIN XL) 150 MG 24 hr tablet Take 150 mg by mouth every morning. Patient not taking: Reported on 12/20/2023 12/03/22   [provider]   hydrOXYzine (ATARAX) 50 MG tablet Take 50 mg by mouth at bedtime.    [provider]  ketorolac  (TORADOL ) 10 MG tablet Oral Patient not taking: Reported on 01/27/2024 09/20/16   [provider]  meclizine (ANTIVERT) 25 MG tablet TAKE 1 TABLET BY MOUTH 3 (THREE) TIMES A DAY AS NEEDED FOR DIZZINESS (VERTIGO) FOR UP TO 10 DAYS. Patient not taking: Reported on 01/27/2024    [provider]  meloxicam (MOBIC) 15 MG tablet Mobic 15 mg Patient not taking: Reported on 01/27/2024 09/24/16   [provider]  meloxicam (MOBIC) 7.5 MG tablet TAKE 1 TABLET BY MOUTH EVERY DAY WITH MEALS Patient not taking: Reported on 01/27/2024  [provider]  Meth-Hyo-M Bl-Na Phos-Ph Sal (URO-MP) 118 MG CAPS Take 2 capsules by mouth every 8 (eight) hours as needed. urabelle Patient not taking: Reported on 01/27/2024 02/02/22   [provider]  methenamine (HIPREX) 1 g tablet Take 1 tablet by mouth 2 (two) times daily. Patient not taking: Reported on 01/27/2024    [provider]  methylphenidate  18 MG PO CR tablet Take 1 tablet by mouth every morning. Patient not taking: Reported on 01/27/2024    [provider]  methylphenidate  27 MG PO CR tablet TAKE 1 TABLET BY MOUTH EVERY DAY IN THE MORNING Patient not taking: Reported on 01/27/2024    [provider]  methylPREDNISolone  (MEDROL  DOSEPAK) 4 MG TBPK tablet Take by mouth. Patient not taking: Reported on 01/27/2024 12/08/23   [provider]  minocycline (DYNACIN) 50 MG tablet TAKE 1 TABLET TWICE DAILY FOR 3 DAYS THEN 1 DAILY ON DAYS 4 AND 5. REPEAT FOR FUTURE OUTBREAKS Patient not taking: Reported on 01/27/2024    [provider]  montelukast  (SINGULAIR ) 10 MG tablet Take 1 tablet (10 mg total) by mouth at bedtime. 12/20/23   Kandice Orleans, MD  Multiple Vitamins-Minerals (ONE DAILY CALCIUM/IRON) TABS Take by mouth. Patient not taking: Reported on 01/27/2024 10/09/21   [provider]  nitrofurantoin , macrocrystal-monohydrate, (MACROBID ) 100 MG capsule TAKE 1 CAPSULE BY MOUTH EVERY 12 HOURS WITH FOOD Patient not taking: Reported on 01/27/2024    [provider]  nortriptyline  (PAMELOR ) 10 MG capsule Oral Patient not taking: Reported on 01/27/2024 09/20/16   [provider]  orphenadrine (NORFLEX) 100 MG tablet Take 100 mg by mouth. Patient not taking: Reported on 01/27/2024 05/17/23 05/16/24  [provider]  oxyCODONE -acetaminophen  (PERCOCET) 5-325 MG tablet Oral Patient not taking: Reported on 01/27/2024 02/13/17   [provider]  oxymetazoline (AFRIN) 0.05 % nasal spray Place 1 spray into the nose. Patient not taking: Reported on 01/27/2024 01/02/21   [provider]  prednisoLONE acetate (PRED FORTE) 1 % ophthalmic suspension INSTILL 1 DROP INTO AFFECTED EYE(S) 4 TIMES A DAY FOR 7 DAYS Patient not taking: Reported on 01/27/2024    [provider]  predniSONE  (STERAPRED UNI-PAK 21 TAB) 10 MG (21) TBPK tablet Take by mouth. Patient not taking: Reported on 01/27/2024 12/12/23   [provider]  promethazine  (PHENERGAN ) 25 MG tablet Oral Patient not taking: Reported on 01/27/2024 02/13/17   [provider]  promethazine -dextromethorphan (PROMETHAZINE -DM) 6.25-15 MG/5ML syrup Take 5 mLs by mouth 4 (four) times daily as needed. Patient not taking: Reported on 01/27/2024 12/12/23   [provider]  rizatriptan (MAXALT) 10 MG tablet Oral Patient not taking: Reported on 01/27/2024 09/20/16   [provider]  sertraline  (ZOLOFT ) 25 MG tablet 1 tablet Orally Once a day Patient not taking: Reported on 01/27/2024    [provider]  silver sulfADIAZINE (SILVADENE) 1 % cream APPLY A 1/16 INCH (1.5 MM) THICK LAYER TO ENTIRE BURN AREA BY TOPICALROUTE 2 TIMES PER DAY Patient not taking: Reported on 01/27/2024 09/25/19   [provider]  sodium fluoride (FLUORISHIELD) 1.1 % GEL dental gel  BRUSH TEETH WITH SMALL AMOUNT TWICE DAILY Patient not taking: Reported on 01/27/2024 09/30/19   [provider]  Ubrogepant 100 MG TABS Take by mouth as needed. ubrevly Patient not taking: Reported on 01/27/2024    [provider]  UNABLE TO FIND Testesterone pellet every 3 months    [provider]  valACYclovir (VALTREX) 1000 MG  tablet Take 1/2 tab po bid X 3-5 days prn. For oral lesions, 2 tabs po , repeat in 12 hr. Patient not taking: Reported on 01/27/2024    [provider]  Zavegepant HCl 10 MG/ACT SOLN Place 10 mg into the nose. Patient not taking: Reported on 01/27/2024 05/17/23   [provider]    Family History Family History  Problem Relation Age of Onset   Arthritis Mother    Heart disease Mother        Hole in the heart   Hypertension Mother    Cancer Paternal Aunt        brain   Cancer Paternal Grandfather        skin    Social History Social History   Tobacco Use   Smoking status: Never   Smokeless tobacco: Never  Vaping Use   Vaping status: Never Used  Substance Use Topics   Alcohol use: Not Currently   Drug use: No     Allergies   Sumatriptan , Galcanezumab-gnlm, Sulfa  antibiotics, Ciprofloxacin , Iodinated contrast media, and Iodine   Review of Systems Review of Systems  Constitutional:  Negative for chills and fever.  Eyes:  Negative for discharge and redness.  Respiratory:  Negative for shortness of breath.   Gastrointestinal:  Negative for abdominal pain, nausea and vomiting.  Genitourinary:  Positive for dysuria and frequency.  Musculoskeletal:  Negative for back pain.     Physical Exam Triage Vital Signs ED Triage Vitals [01/27/24 1216]  Encounter Vitals Group     BP 117/78     Systolic BP Percentile      Diastolic BP Percentile      Pulse Rate 69     Resp 18     Temp 97.8 F (36.6 C)     Temp Source Oral     SpO2 98 %     Weight      Height      Head Circumference      Peak Flow       Pain Score 3     Pain Loc      Pain Education      Exclude from Growth Chart    No data found.  Updated Vital Signs BP 117/78 (BP Location: Left Arm)   Pulse 69   Temp 97.8 F (36.6 C) (Oral)   Resp 18   SpO2 98%   Visual Acuity Right Eye Distance:   Left Eye Distance:   Bilateral Distance:    Right Eye Near:   Left Eye Near:    Bilateral Near:     Physical Exam Vitals and nursing note reviewed.  Constitutional:      General: She is not in acute distress.    Appearance: Normal appearance. She is not ill-appearing.  HENT:     Head: Normocephalic and atraumatic.  Eyes:     Conjunctiva/sclera: Conjunctivae normal.  Cardiovascular:     Rate and Rhythm: Normal rate.  Pulmonary:     Effort: Pulmonary effort is normal. No respiratory distress.  Neurological:     Mental Status: She is alert.  Psychiatric:        Mood and Affect: Mood normal.        Behavior: Behavior normal.        Thought Content: Thought content normal.      UC Treatments / Results  Labs (all labs ordered are listed, but only abnormal results are displayed) Labs Reviewed  POCT URINALYSIS DIP (MANUAL ENTRY) - Abnormal;  Notable for the following components:      Result Value   Color, UA green (*)    All other components within normal limits  URINE CULTURE    EKG   Radiology No results found.  Procedures Procedures (including critical care time)  Medications Ordered in UC Medications - No data to display  Initial Impression / Assessment and Plan / UC Course  I have reviewed the triage vital signs and the nursing notes.  Pertinent labs & imaging results that were available during my care of the patient were reviewed by me and considered in my medical decision making (see chart for details).    UA without concerning findings.  Patient will continue to treat as interstitial cystitis and we will order culture to definitively rule out UTI.  Will await results for further recommendation  but encouraged follow-up with any further concerns.  Final Clinical Impressions(s) / UC Diagnoses   Final diagnoses:  Urinary frequency   Discharge Instructions   None    ED Prescriptions   None    PDMP not reviewed this encounter.   Vernestine Gondola, PA-C 01/27/24 1355

## 2024-01-30 ENCOUNTER — Ambulatory Visit (HOSPITAL_COMMUNITY): Payer: Self-pay

## 2024-01-30 LAB — URINE CULTURE: Culture: 50000 — AB

## 2024-01-30 MED ORDER — AMOXICILLIN-POT CLAVULANATE 875-125 MG PO TABS: 1.0000 | ORAL_TABLET | Freq: Two times a day (BID) | ORAL | 0 refills | Status: DC

## 2024-02-05 ENCOUNTER — Telehealth: Admitting: Physician Assistant

## 2024-02-05 DIAGNOSIS — N3090 Cystitis, unspecified without hematuria: Secondary | ICD-10-CM | POA: Diagnosis not present

## 2024-02-05 MED ORDER — CIPROFLOXACIN HCL 500 MG PO TABS: 500.0000 mg | ORAL_TABLET | Freq: Two times a day (BID) | ORAL | 0 refills | Status: AC

## 2024-02-05 NOTE — Patient Instructions (Signed)
 Myron Lei, thank you for joining Hyla Maillard, PA-C for today's virtual visit.  While this provider is not your primary care provider (PCP), if your PCP is located in our provider database this encounter information will be shared with them immediately following your visit.   A [facility] MyChart account gives you access to today's visit and all your visits, tests, and labs performed at [facility] " click here if you don't have a [facility] MyChart account or go to [email or web address]  Consent: (Patient) Zoe Briggs provided verbal consent for this virtual visit at the beginning of the encounter.  Current Medications:  Current Outpatient Medications:    ciprofloxacin  (CIPRO ) 500 MG tablet, Take 1 tablet (500 mg total) by mouth 2 (two) times daily for 3 days., Disp: 6 tablet, Rfl: 0   acetaminophen  (TYLENOL ) 325 MG tablet, Take 650 mg by mouth every 6 (six) hours as needed for mild pain or headache., Disp: , Rfl:    amoxicillin -clavulanate (AUGMENTIN ) 875-125 MG tablet, Take 1 tablet by mouth every 12 (twelve) hours., Disp: 14 tablet, Rfl: 0   azelastine  (ASTELIN ) 0.1 % nasal spray, Place 2 sprays into both nostrils 2 (two) times daily as needed for rhinitis., Disp: 30 mL, Rfl: 5   buPROPion (WELLBUTRIN XL) 150 MG 24 hr tablet, Take 150 mg by mouth every morning. (Patient not taking: Reported on 12/20/2023), Disp: , Rfl:    celecoxib (CELEBREX) 200 MG capsule, Take 200 mg by mouth 2 (two) times daily as needed., Disp: , Rfl:    ELMIRON 100 MG capsule, Take 100 mg by mouth 3 (three) times daily., Disp: , Rfl:    estradiol -norethindrone  (COMBIPATCH) 0.05-0.14 MG/DAY, Place 1 patch onto the skin 2 (two) times a week., Disp: , Rfl:    fluticasone  (FLONASE ) 50 MCG/ACT nasal spray, INSTILL 1 SPRAY IN EACH NOSTRIL DAILY., Disp: , Rfl:    hydrOXYzine (ATARAX) 50 MG tablet, Take 50 mg by mouth at bedtime., Disp: , Rfl:    ibuprofen (ADVIL) 200 MG tablet, Take 200 mg by  mouth every 6 (six) hours as needed., Disp: , Rfl:    levalbuterol  (XOPENEX  HFA) 45 MCG/ACT inhaler, Inhale 2 puffs into the lungs every 8 (eight) hours as needed for wheezing or shortness of breath., Disp: 1 each, Rfl: 1   levocetirizine (XYZAL ) 5 MG tablet, Take 1 tablet (5 mg total) by mouth every evening., Disp: 30 tablet, Rfl: 5   meclizine (ANTIVERT) 25 MG tablet, TAKE 1 TABLET BY MOUTH 3 (THREE) TIMES A DAY AS NEEDED FOR DIZZINESS (VERTIGO) FOR UP TO 10 DAYS. (Patient not taking: Reported on 01/27/2024), Disp: , Rfl:    Meth-Hyo-M Bl-Na Phos-Ph Sal (URO-MP) 118 MG CAPS, Take 2 capsules by mouth every 8 (eight) hours as needed. urabelle (Patient not taking: Reported on 01/27/2024), Disp: , Rfl:    metoprolol tartrate (LOPRESSOR) 25 MG tablet, Take by mouth., Disp: , Rfl:    Multiple Vitamin (MULTIVITAMIN) tablet, Take 1 tablet by mouth daily., Disp: , Rfl:    Multiple Vitamins-Minerals (ONE DAILY CALCIUM/IRON) TABS, Take by mouth. (Patient not taking: Reported on 01/27/2024), Disp: , Rfl:    Omega-3 Fatty Acids (OMEGA 3 PO), Take by mouth 2 (two) times daily with breakfast and lunch. 2 tabs bid plant based, Disp: , Rfl:    orphenadrine (NORFLEX) 100 MG tablet, Take 100 mg by mouth. (Patient not taking: Reported on 01/27/2024), Disp: , Rfl:    oxymetazoline (AFRIN) 0.05 % nasal spray, Place 1 spray into the  nose. (Patient not taking: Reported on 01/27/2024), Disp: , Rfl:    Pregabalin (LYRICA PO), Take 25 mg by mouth as needed., Disp: , Rfl:    progesterone (PROMETRIUM) 100 MG capsule, Take 100 mg by mouth at bedtime., Disp: , Rfl:    traMADol  (ULTRAM ) 50 MG tablet, Take 50 mg by mouth 2 (two) times daily., Disp: , Rfl:    UNABLE TO FIND, Testesterone pellet every 3 months, Disp: , Rfl:    Medications ordered in this encounter:  Meds ordered this encounter  Medications   ciprofloxacin  (CIPRO ) 500 MG tablet    Sig: Take 1 tablet (500 mg total) by mouth 2 (two) times daily for 3 days.    Dispense:   6 tablet    Refill:  0    Supervising Provider:   LAMPTEY, PHILIP O [[id number]]     *If you need refills on other medications prior to your next appointment, please contact your pharmacy*  Follow-Up: Call back or seek an in-person evaluation if the symptoms worsen or if the condition fails to improve as anticipated.  [facility] [phone]  Other Instructions Your symptoms are consistent with a bladder infection, also called acute cystitis. Please take your antibiotic (Cipro ) as directed until all pills are gone.  Stay very well hydrated.  Consider a daily probiotic (Align, Culturelle, or Activia) to help prevent stomach upset caused by the antibiotic.  Taking a probiotic daily may also help prevent recurrent UTIs.  Also consider taking AZO (Phenazopyridine ) tablets to help decrease pain with urination.    Urinary Tract Infection A urinary tract infection (UTI) can occur any place along the urinary tract. The tract includes the kidneys, ureters, bladder, and urethra. A type of germ called bacteria often causes a UTI. UTIs are often helped with antibiotic medicine.  HOME CARE  If given, take antibiotics as told by your doctor. Finish them even if you start to feel better. Drink enough fluids to keep your pee (urine) clear or pale yellow. Avoid tea, drinks with caffeine, and bubbly (carbonated) drinks. Pee often. Avoid holding your pee in for a long time. Pee before and after having sex (intercourse). Wipe from front to back after you poop (bowel movement) if you are a woman. Use each tissue only once. GET HELP RIGHT AWAY IF:  You have back pain. You have lower belly (abdominal) pain. You have chills. You feel sick to your stomach (nauseous). You throw up (vomit). Your burning or discomfort with peeing does not go away. You have a fever. Your symptoms are not better in 3 days. MAKE SURE YOU:  Understand these instructions. Will watch your condition. Will get help  right away if you are not doing well or get worse. Document Released: 02/13/2008 Document Revised: 05/21/2012 Document Reviewed: 03/27/2012 [facility] Patient Information 2015 [place], [place]. This information is not intended to replace advice given to you by your health care provider. Make sure you discuss any questions you have with your health care provider.    If you have been instructed to have an in-person evaluation today at a local Urgent Care facility, please use the link below. It will take you to a list of all of our available [facility], including address, phone number and hours of operation. Please do not delay care.  [facility]  If you or a family member do not have a primary care provider, use the link below to schedule a visit and establish care. When  you choose a [facility] primary care physician or advanced practice provider, you gain a long-term partner in health. Find a Primary Care Provider  Learn more about [facility] in-office and virtual care options: [facility] - Get Care Now

## 2024-02-05 NOTE — Progress Notes (Signed)
 Virtual Visit Consent   Zoe Briggs, you are scheduled for a virtual visit with a [facility] provider today. Just as with appointments in the office, your consent must be obtained to participate. Your consent will be active for this visit and any virtual visit you may have with one of our providers in the next 365 days. If you have a MyChart account, a copy of this consent can be sent to you electronically.  As this is a virtual visit, video technology does not allow for your provider to perform a traditional examination. This may limit your provider's ability to fully assess your condition. If your provider identifies any concerns that need to be evaluated in person or the need to arrange testing (such as labs, EKG, etc.), we will make arrangements to do so. Although advances in technology are sophisticated, we cannot ensure that it will always work on either your end or our end. If the connection with a video visit is poor, the visit may have to be switched to a telephone visit. With either a video or telephone visit, we are not always able to ensure that we have a secure connection.  By engaging in this virtual visit, you consent to the provision of healthcare and authorize for your insurance to be billed (if applicable) for the services provided during this visit. Depending on your insurance coverage, you may receive a charge related to this service.  I need to obtain your verbal consent now. Are you willing to proceed with your visit today? Zoe Briggs has provided verbal consent on 02/05/2024 for a virtual visit (video or telephone). Zoe Briggs, [place]  Date: 02/05/2024 10:10 AM   Virtual Visit via Video Note   I, Zoe Briggs, connected with  Zoe Briggs  ([id number], 1976/03/30) on 02/05/24 at 10:00 AM EDT by a video-enabled telemedicine application and verified that I am speaking with the correct person using two identifiers.  Location: Patient: Virtual Visit  Location Patient: Home Provider: Virtual Visit Location Provider: Home Office   I discussed the limitations of evaluation and management by telemedicine and the availability of in person appointments. The patient expressed understanding and agreed to proceed.    History of Present Illness: Zoe Briggs is a 48 y.o. who identifies as a female who was assigned female at birth, and is being seen today for some continued dysuria despite treatment for UTI with Augmentin . Patient was evaluated at [facility] on 5/19 at which time she was started on antibiotic. Urine culture revealed Klebsiella with resistance to ampicillin. She denies any worsening symptoms but the dysuria is not resolving and she has just a few tablets left of antibiotic. Has follow-up with her Urologist but cannot be seen until end of next month.   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   Acute pancreatitis 08/22/2019   Vitamin B 12 deficiency 08/10/2019   Vitamin D deficiency 08/10/2019   Recurrent major depressive  disorder, 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/2017   Leukopenia 08/26/2017   History of creation of ostomy (HCC) 07/17/2017   Interstitial cystitis 03/16/2015   Hair loss 08/18/2013   Recurrent UTI 11/04/2012   ADD (attention deficit disorder) 07/08/2012   Asthma 05/27/2012   Depression 05/27/2012   Colon cancer (HCC) 05/27/2012   Seasonal allergies  05/27/2012   History of rectal cancer 02/25/2012   Abnormal abdominal CT scan 02/25/2012    Allergies:  Allergies  Allergen Reactions   Sumatriptan  Rash, Dermatitis and Other (See Comments)    Other Reaction(s): Flushing, Not available   Galcanezumab-Gnlm Other (See Comments)    Pancreatitis   Sulfa  Antibiotics Other (See Comments) and Dermatitis    "Burns  stomach"  abd pain  abd pain  abd pain  abd pain  abd pain  abd pain  abd pain, abd pain, abd pain, abd pain  Other Reaction(s): Not available  Other Reaction(s): Unknown   Ciprofloxacin  Nausea Only   Iodinated Contrast Media Rash    Only when given together with chemo   Iodine Dermatitis    Only when given together with chemo   Medications:  Current Outpatient Medications:    ciprofloxacin  (CIPRO ) 500 MG tablet, Take 1 tablet (500 mg total) by mouth 2 (two) times daily for 3 days., Disp: 6 tablet, Rfl: 0   acetaminophen  (TYLENOL ) 325 MG tablet, Take 650 mg by mouth every 6 (six) hours as needed for mild pain or headache., Disp: , Rfl:    amoxicillin -clavulanate (AUGMENTIN ) 875-125 MG tablet, Take 1 tablet by mouth every 12 (twelve) hours., Disp: 14 tablet, Rfl: 0   azelastine  (ASTELIN ) 0.1 % nasal spray, Place 2 sprays into both nostrils 2 (two) times daily as needed for rhinitis., Disp: 30 mL, Rfl: 5   buPROPion (WELLBUTRIN XL) 150 MG 24 hr tablet, Take 150 mg by mouth every morning. (Patient not taking: Reported on 12/20/2023), Disp: , Rfl:    celecoxib (CELEBREX) 200 MG capsule, Take 200 mg by mouth 2 (two) times daily as needed., Disp: , Rfl:    ELMIRON 100 MG capsule, Take 100 mg by mouth 3 (three) times daily., Disp: , Rfl:    estradiol -norethindrone  (COMBIPATCH) 0.05-0.14 MG/DAY, Place 1 patch onto the skin 2 (two) times a week., Disp: , Rfl:    fluticasone  (FLONASE ) 50 MCG/ACT nasal spray, INSTILL 1 SPRAY IN EACH NOSTRIL DAILY., Disp: , Rfl:    hydrOXYzine (ATARAX) 50 MG tablet, Take 50 mg by mouth at bedtime.,  Disp: , Rfl:    ibuprofen (ADVIL) 200 MG tablet, Take 200 mg by mouth every 6 (six) hours as needed., Disp: , Rfl:    levalbuterol  (XOPENEX  HFA) 45 MCG/ACT inhaler, Inhale 2 puffs into the lungs every 8 (eight) hours as needed for wheezing or shortness of breath., Disp: 1 each, Rfl: 1   levocetirizine (XYZAL ) 5 MG tablet, Take 1 tablet (5 mg total) by mouth every evening., Disp: 30 tablet, Rfl: 5   meclizine (ANTIVERT) 25 MG tablet, TAKE 1 TABLET BY MOUTH 3 (THREE) TIMES A DAY AS NEEDED FOR DIZZINESS (VERTIGO) FOR UP TO 10 DAYS. (Patient not taking: Reported on 01/27/2024), Disp: , Rfl:    Meth-Hyo-M Bl-Na Phos-Ph Sal (URO-MP) 118 MG CAPS, Take 2 capsules by mouth every 8 (eight) hours as needed. urabelle (Patient not taking: Reported on 01/27/2024), Disp: , Rfl:    metoprolol tartrate (LOPRESSOR) 25 MG tablet, Take by mouth., Disp: , Rfl:    Multiple Vitamin (MULTIVITAMIN) tablet, Take 1 tablet  by mouth daily., Disp: , Rfl:    Multiple Vitamins-Minerals (ONE DAILY CALCIUM/IRON) TABS, Take by mouth. (Patient not taking: Reported on 01/27/2024), Disp: , Rfl:    Omega-3 Fatty Acids (OMEGA 3 PO), Take by mouth 2 (two) times daily with breakfast and lunch. 2 tabs bid plant based, Disp: , Rfl:    orphenadrine (NORFLEX) 100 MG tablet, Take 100 mg by mouth. (Patient not taking: Reported on 01/27/2024), Disp: , Rfl:    oxymetazoline (AFRIN) 0.05 % nasal spray, Place 1 spray into the nose. (Patient not taking: Reported on 01/27/2024), Disp: , Rfl:    Pregabalin (LYRICA PO), Take 25 mg by mouth as needed., Disp: , Rfl:    progesterone (PROMETRIUM) 100 MG capsule, Take 100 mg by mouth at bedtime., Disp: , Rfl:    traMADol  (ULTRAM ) 50 MG tablet, Take 50 mg by mouth 2 (two) times daily., Disp: , Rfl:    UNABLE TO FIND, Testesterone pellet every 3 months, Disp: , Rfl:   Observations/Objective: Patient is well-developed, well-nourished in no acute distress.  Resting comfortably at home.  Head is normocephalic,  atraumatic.  No labored breathing.  Speech is clear and coherent with logical content.  Patient is alert and oriented at baseline.   Assessment and Plan: 1. Cystitis (Primary) - ciprofloxacin  (CIPRO ) 500 MG tablet; Take 1 tablet (500 mg total) by mouth 2 (two) times daily for 3 days.  Dispense: 6 tablet; Refill: 0  Not fully resolving. Based on culture and sensitivities some resistance to penicillins. Discussed best options and in regards to her multiple medication intolerances. Stop Augmentin .  Will start Cipro  as she tolerates fine as long as she does not take on an empty stomach. . Supportive measures and OTC medications reviewed. Strict in-person evaluation precautions discussed.    Follow Up Instructions: I discussed the assessment and treatment plan with the patient. The patient was provided an opportunity to ask questions and all were answered. The patient agreed with the plan and demonstrated an understanding of the instructions.  A copy of instructions were sent to the patient via MyChart unless otherwise noted below.   The patient was advised to call back or seek an in-person evaluation if the symptoms worsen or if the condition fails to improve as anticipated.    Zoe Maillard, PA-C

## 2024-02-16 NOTE — Progress Notes (Deleted)
 Skin testing note  RE: Zoe Briggs MRN: [id number] DOB: 1976/06/15 Date of Office Visit: 02/17/2024  Referring provider: Authur Leghorn, MD Primary care provider: Authur Leghorn, MD  Chief Complaint: skin testing  History of Present Illness: I had the pleasure of seeing Zoe Briggs for a skin testing visit at the [facility] on 02/16/2024. She is a 48 y.o. female, who is being followed for asthma and allergic rhinitis. Her previous allergy office visit was on 12/20/2023 with Dr. Lydia Sams. Today is a skin testing visit.   Discussed the use of AI scribe software for clinical note transcription with the patient, who gave verbal consent to proceed.  History of Present Illness             Mild Intermittent Asthma: - MDI technique discussed.  Spirometry today was normal.  Cough is likely post infectious and should improve with time.  Overall controlled, only 1 flare up over the past few years requiring oral prednisone  recently.  - Rescue inhaler: Xopenex  2 puffs every 4-6 hours as needed for respiratory symptoms of shortness of breath, or wheezing Asthma control goals:  Full participation in all desired activities (may need albuterol before activity) Albuterol use two times or less a week on average (not counting use with activity) Cough interfering with sleep two times or less a month Oral steroids no more than once a year No hospitalizations   Chronic Rhinitis: - Uncontrolled, discussed retesting as symptoms have continued to worsen over the years.  Will repeat skin testing + IDs for environmental and if negative, obtain blood testing for aeroallergens.  If both are negative, she has chronic/non allergic rhinitis.  Does not like nose sprays due to drying effect but can consider Ipratropium if excessive PND.   She wishes to hold off until Summer for testing.  - Use nasal saline rinses before nose sprays such as with Neilmed Sinus Rinse.  Use distilled water .    - Use Flonase  Sensimist 1-2 sprays each nostril daily. Aim upward and outward. - Use Azelastine  2 sprays each nostril twice daily as needed for runny nose, drainage, sneezing, congestion. Aim upward and outward. - Use  Allegra 180mg  or Xyzal  5mg  daily.  - Use Singulair  10mg  daily.  Stop if there are any mood/behavioral changes.   Assessment and Plan: Zoe Briggs is a 48 y.o. female with: ***  Assessment and Plan              No follow-ups on file.  No orders of the defined types were placed in this encounter.  Lab Orders  No laboratory test(s) ordered today    Diagnostics: Spirometry:  Tracings reviewed. Her effort: {Blank single:19197::"Good reproducible efforts.","It was hard to get consistent efforts and there is a question as to whether this reflects a maximal maneuver.","Poor effort, data can not be interpreted."} FVC: ***L FEV1: ***L, ***% predicted FEV1/FVC ratio: ***% Interpretation: {Blank single:19197::"Spirometry consistent with mild obstructive disease","Spirometry consistent with moderate obstructive disease","Spirometry consistent with severe obstructive disease","Spirometry consistent with possible restrictive disease","Spirometry consistent with mixed obstructive and restrictive disease","Spirometry uninterpretable due to technique","Spirometry consistent with normal pattern","No overt abnormalities noted given today's efforts"}.  Please see scanned spirometry results for details.  Skin Testing: Environmental allergy panel. *** Results discussed with patient/family.   Previous notes and tests were reviewed. The plan was reviewed with the patient/family, and all questions/concerned were addressed.  It was my pleasure to see Zoe Briggs today and participate in her care. Please feel free to  contact me with any questions or concerns.  Sincerely,  Eudelia Hero, DO Allergy & Immunology  [facility]  [facility] office: [phone] [facility] office: [phone]

## 2024-02-17 ENCOUNTER — Ambulatory Visit: Admitting: Allergy

## 2024-02-17 DIAGNOSIS — J309 Allergic rhinitis, unspecified: Secondary | ICD-10-CM

## 2024-02-17 DIAGNOSIS — E7800 Pure hypercholesterolemia, unspecified: Secondary | ICD-10-CM | POA: Insufficient documentation

## 2024-02-17 DIAGNOSIS — J4520 Mild intermittent asthma, uncomplicated: Secondary | ICD-10-CM

## 2024-02-17 DIAGNOSIS — J3089 Other allergic rhinitis: Secondary | ICD-10-CM

## 2024-05-09 ENCOUNTER — Ambulatory Visit: Payer: Self-pay

## 2024-05-10 ENCOUNTER — Inpatient Hospital Stay
Admission: RE | Admit: 2024-05-10 | Discharge: 2024-05-10 | Discharge status: Home / Self Care | Source: Ambulatory Visit

## 2024-05-10 VITALS — BP 103/69 | HR 79 | Temp 97.9°F | Resp 18 | Ht 70.0 in | Wt 162.0 lb

## 2024-05-10 DIAGNOSIS — N3001 Acute cystitis with hematuria: Secondary | ICD-10-CM | POA: Insufficient documentation

## 2024-05-10 DIAGNOSIS — R300 Dysuria: Secondary | ICD-10-CM | POA: Diagnosis present

## 2024-05-10 DIAGNOSIS — Z87440 Personal history of urinary (tract) infections: Secondary | ICD-10-CM | POA: Diagnosis not present

## 2024-05-10 LAB — POCT URINE DIPSTICK
Bilirubin, UA: NEGATIVE
Glucose, UA: NEGATIVE mg/dL
Ketones, POC UA: NEGATIVE mg/dL
Nitrite, UA: POSITIVE — AB
POC PROTEIN,UA: NEGATIVE
Spec Grav, UA: 1.015 (ref 1.010–1.025)
Urobilinogen, UA: 0.2 U/dL
pH, UA: 7 (ref 5.0–8.0)

## 2024-05-10 MED ORDER — CEFUROXIME AXETIL 250 MG PO TABS: 250.0000 mg | ORAL_TABLET | Freq: Two times a day (BID) | ORAL | 0 refills | Status: AC

## 2024-05-10 MED ORDER — PHENAZOPYRIDINE HCL 200 MG PO TABS: 200.0000 mg | ORAL_TABLET | Freq: Three times a day (TID) | ORAL | 0 refills | Status: DC

## 2024-05-10 NOTE — ED Provider Notes (Signed)
 [facility]  Note:  This document was prepared using Conservation officer, historic buildings and may include unintentional dictation errors.  MRN: [id number] DOB: [birth year]  Subjective:   Zoe Briggs is a 48 y.o. female presenting for dysuria, abnormal urine odor.  Patient denies any fever, states she has extensive history of urinary tract infections with similar presentation to current manifestation.  No fever, cough, sore throat, abdominal pain, flank pain, chest pain, shortness of breath, weakness, dizziness.  No current facility-administered medications for this encounter.  Current Outpatient Medications:    cefUROXime  (CEFTIN ) 250 MG tablet, Take 1 tablet (250 mg total) by mouth 2 (two) times daily with a meal for 7 days., Disp: 14 tablet, Rfl: 0   Fremanezumab-vfrm (AJOVY) 225 MG/1.5ML SOAJ, INJECT 1 AUTOINJECTOR AS DIRECETD, Disp: , Rfl:    methenamine (HIPREX) 1 g tablet, Take 1 g by mouth., Disp: , Rfl:    phenazopyridine  (PYRIDIUM ) 200 MG tablet, Take 1 tablet (200 mg total) by mouth 3 (three) times daily., Disp: 6 tablet, Rfl: 0   Ubrogepant (UBRELVY) 100 MG TABS, Take 100 mg by mouth., Disp: , Rfl:    valACYclovir (VALTREX) 1000 MG tablet, Take by mouth., Disp: , Rfl:    acetaminophen  (TYLENOL ) 325 MG tablet, Take 650 mg by mouth every 6 (six) hours as needed for mild pain or headache., Disp: , Rfl:    amoxicillin -clavulanate (AUGMENTIN ) 875-125 MG tablet, Take 1 tablet by mouth every 12 (twelve) hours., Disp: 14 tablet, Rfl: 0   azelastine  (ASTELIN ) 0.1 % nasal spray, Place 2 sprays into both nostrils 2 (two) times daily as needed for rhinitis., Disp: 30 mL, Rfl: 5   baclofen (LIORESAL) 10 MG tablet, Take 10 mg by mouth 3 (three) times daily., Disp: , Rfl:    benzonatate (TESSALON) 100 MG capsule, TAKE ONE CAPSULE (100 MG DOSE) BY MOUTH EVERY 8 (EIGHT) HOURS FOR 7 DAYS., Disp: , Rfl:    buPROPion (WELLBUTRIN XL) 150 MG 24 hr tablet, Take 150 mg by mouth every morning.  (Patient not taking: Reported on 12/20/2023), Disp: , Rfl:    celecoxib (CELEBREX) 200 MG capsule, Take 200 mg by mouth 2 (two) times daily as needed., Disp: , Rfl:    ELMIRON 100 MG capsule, Take 100 mg by mouth 3 (three) times daily., Disp: , Rfl:    estradiol -norethindrone  (COMBIPATCH) 0.05-0.14 MG/DAY, Place 1 patch onto the skin 2 (two) times a week., Disp: , Rfl:    eszopiclone (LUNESTA) 1 MG TABS tablet, TAKE 1 TABLET BY MOUTH EVERY DAY AT BEDIME., Disp: , Rfl:    fluticasone  (FLONASE ) 50 MCG/ACT nasal spray, INSTILL 1 SPRAY IN EACH NOSTRIL DAILY., Disp: , Rfl:    heparin 10000 UNIT/ML injection, INSTILL 4 ML (40,000 UNITS) INTO THE BLADDER DAILY AS NEEDED, Disp: , Rfl:    hydrOXYzine (ATARAX) 50 MG tablet, Take 50 mg by mouth at bedtime., Disp: , Rfl:    ibuprofen (ADVIL) 200 MG tablet, Take 200 mg by mouth every 6 (six) hours as needed., Disp: , Rfl:    levalbuterol  (XOPENEX  HFA) 45 MCG/ACT inhaler, Inhale 2 puffs into the lungs every 8 (eight) hours as needed for wheezing or shortness of breath., Disp: 1 each, Rfl: 1   levocetirizine (XYZAL ) 5 MG tablet, Take 1 tablet (5 mg total) by mouth every evening., Disp: 30 tablet, Rfl: 5   meclizine (ANTIVERT) 25 MG tablet, TAKE 1 TABLET BY MOUTH 3 (THREE) TIMES A DAY AS NEEDED FOR DIZZINESS (VERTIGO) FOR UP TO 10  DAYS. (Patient not taking: Reported on 01/27/2024), Disp: , Rfl:    Meth-Hyo-M Bl-Na Phos-Ph Sal (URO-MP) 118 MG CAPS, Take 2 capsules by mouth every 8 (eight) hours as needed. urabelle (Patient not taking: Reported on 01/27/2024), Disp: , Rfl:    methylphenidate  18 MG PO CR tablet, TAKE 1 TABLET BY MOUTH EVERY DAY IN THE MORNING, Disp: , Rfl:    metoprolol tartrate (LOPRESSOR) 25 MG tablet, Take by mouth., Disp: , Rfl:    montelukast  (SINGULAIR ) 10 MG tablet, TAKE 1 TABLET BY MOUTH EVERYDAY AT BEDTIME, Disp: , Rfl:    Multiple Vitamin (MULTIVITAMIN) tablet, Take 1 tablet by mouth daily., Disp: , Rfl:    Multiple Vitamins-Minerals (ONE DAILY  CALCIUM/IRON) TABS, Take by mouth. (Patient not taking: Reported on 01/27/2024), Disp: , Rfl:    naloxone (NARCAN) nasal spray 4 mg/0.1 mL, SMARTSIG:Both Nares, Disp: , Rfl:    nitrofurantoin , macrocrystal-monohydrate, (MACROBID ) 100 MG capsule, Take 1 capsule by mouth 2 (two) times daily., Disp: , Rfl:    Omega-3 Fatty Acids (OMEGA 3 PO), Take by mouth 2 (two) times daily with breakfast and lunch. 2 tabs bid plant based, Disp: , Rfl:    orphenadrine (NORFLEX) 100 MG tablet, Take 100 mg by mouth. (Patient not taking: Reported on 01/27/2024), Disp: , Rfl:    oxymetazoline (AFRIN) 0.05 % nasal spray, Place 1 spray into the nose. (Patient not taking: Reported on 01/27/2024), Disp: , Rfl:    Pregabalin (LYRICA PO), Take 25 mg by mouth as needed., Disp: , Rfl:    progesterone (PROMETRIUM) 100 MG capsule, Take 100 mg by mouth at bedtime., Disp: , Rfl:    traMADol  (ULTRAM ) 50 MG tablet, Take 50 mg by mouth 2 (two) times daily., Disp: , Rfl:    UNABLE TO FIND, Testesterone pellet every 3 months, Disp: , Rfl:    Zavegepant HCl (ZAVZPRET) 10 MG/ACT SOLN, SPRAY 1 SPRAY INTO THE NOSTRILS DAILY AS NEEDED, Disp: , Rfl:    Allergies  Allergen Reactions   Sumatriptan  Rash, Dermatitis and Other (See Comments)    Other Reaction(s): Flushing, Not available   Galcanezumab-Gnlm Other (See Comments)    Pancreatitis   Iodine Dermatitis    Only when given together with chemo   Sulfa  Antibiotics Other (See Comments) and Dermatitis    Burns  stomach  abd pain  abd pain  abd pain  abd pain  abd pain  abd pain  abd pain, abd pain, abd pain, abd pain  Other Reaction(s): Not available  Other Reaction(s): Unknown   Ciprofloxacin  Nausea Only   Iodinated Contrast Media Rash    Only when given together with chemo    Past Medical History:  Diagnosis Date   ADD (attention deficit  disorder)    Follows w/ [facility].   Allergy    Asthma    Seasonal, Follows w/ Erminio Sensing, PA.   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   Sciatica    right leg   Wears glasses      Past Surgical History:  Procedure Laterality Date   COLON SURGERY  2006   colonscopy     2012 and 08-28-2018   colonscopy  2005   CYSTOSCOPY WITH HYDRODISTENSION AND BIOPSY Bilateral 09/17/2022   Procedure: CYSTOSCOPY/BIOPSY/HYDRODISTENSION FULGURATION BILATERAL RETROGRADE PYELOGRAM;  Surgeon: Carolee Sherwood JONETTA DOUGLAS, MD;  Location: [facility];  Service: Urology;  Laterality: Bilateral;   ERCP  12/04/2019   stent to pancreas done at [place] [facility] for congenital abnormality that can cause pancreatitis   FOOT SURGERY Right 02/2017   bunionectomy   TONSILLECTOMY AND ADENOIDECTOMY     age 82 or 5   UPPER GI ENDOSCOPY  2000   UPPER GI ENDOSCOPY  08/29/2018    Family History  Problem Relation Age of Onset   Arthritis Mother    Heart disease Mother        Hole in the heart   Hypertension Mother    Cancer Paternal Aunt        brain   Cancer Paternal Grandfather        skin    Social History   Tobacco Use   Smoking status: Never   Smokeless tobacco: Never  Vaping Use   Vaping status: Never Used  Substance Use Topics   Alcohol use: Not Currently   Drug use: No    ROS Refer to HPI for ROS details.  Objective:   Vitals: BP 103/69 (BP Location: Left Arm)   Pulse 79   Temp 97.9 F (36.6 C) (Oral)   Resp 18    Ht 5' 10 (1.778 m)   Wt 162 lb (73.5 kg)   SpO2 96%   BMI 23.24 kg/m   Physical Exam Vitals and nursing note reviewed.  Constitutional:      General: She is not in acute distress.    Appearance: Normal appearance. She is well-developed. She is not ill-appearing or toxic-appearing.  HENT:     Head: Normocephalic and atraumatic.  Cardiovascular:     Rate and Rhythm: Normal rate.  Pulmonary:     Effort: Pulmonary effort is normal. No respiratory distress.  Abdominal:     General: There is no distension.     Tenderness: There is no abdominal tenderness. There is no right CVA tenderness, left CVA tenderness, guarding or rebound.  Skin:    General: Skin is warm and dry.  Neurological:     General: No focal deficit present.     Mental Status: She is alert and oriented to person, place, and time.  Psychiatric:        Mood and Affect: Mood normal.        Behavior: Behavior normal.     Procedures  Results for orders placed or performed during the hospital encounter of 05/10/24 (from the past 24 hours)  POCT URINE DIPSTICK     Status: Abnormal   Collection Time: 05/10/24  1:32 PM  Result Value Ref Range   Color, UA green (A) yellow   Clarity, UA clear clear   Glucose, UA negative negative mg/dL   Bilirubin, UA negative negative   Ketones, POC UA negative negative mg/dL   Spec Grav, UA 8.984 8.989 - 1.025   Blood, UA trace-intact (A) negative  pH, UA 7.0 5.0 - 8.0   POC PROTEIN,UA negative negative, trace   Urobilinogen, UA 0.2 0.2 or 1.0 E.U./dL   Nitrite, UA Positive (A) Negative   Leukocytes, UA Large (3+) (A) Negative    No results found.   Assessment and Plan :     Discharge Instructions       1. Acute cystitis with hematuria (Primary) - POCT URINE DIPSTICK completed in UC shows positive nitrite, large leukocytes, trace blood, these findings are strongly indicative of urinary tract infection. - cefUROXime  (CEFTIN ) 250 MG tablet; Take 1 tablet (250 mg total)  by mouth 2 (two) times daily with a meal for 7 days.  Dispense: 14 tablet; Refill: 0 - phenazopyridine  (PYRIDIUM ) 200 MG tablet; Take 1 tablet (200 mg total) by mouth 3 (three) times daily.  Dispense: 6 tablet; Refill: 0 - Urine Culture collected in UC and sent to lab for further testing results should be available in 2 to 3 days. -Continue to monitor symptoms for any change in severity if there is any escalation of current symptoms or development of new symptoms follow-up in ER for further evaluation and management.       Ernestina Joe B Oney Folz   Paiden Cavell, [place], [place] 05/10/24 1349

## 2024-05-10 NOTE — ED Triage Notes (Signed)
 I think I have a UTI - Entered by patient  I am having burning with urination and strong odor with urine. No fever.

## 2024-05-10 NOTE — Discharge Instructions (Signed)
  1. Acute cystitis with hematuria (Primary) - POCT URINE DIPSTICK completed in UC shows positive nitrite, large leukocytes, trace blood, these findings are strongly indicative of urinary tract infection. - cefUROXime  (CEFTIN ) 250 MG tablet; Take 1 tablet (250 mg total) by mouth 2 (two) times daily with a meal for 7 days.  Dispense: 14 tablet; Refill: 0 - phenazopyridine  (PYRIDIUM ) 200 MG tablet; Take 1 tablet (200 mg total) by mouth 3 (three) times daily.  Dispense: 6 tablet; Refill: 0 - Urine Culture collected in UC and sent to lab for further testing results should be available in 2 to 3 days. -Continue to monitor symptoms for any change in severity if there is any escalation of current symptoms or development of new symptoms follow-up in ER for further evaluation and management.

## 2024-05-12 ENCOUNTER — Ambulatory Visit (HOSPITAL_COMMUNITY): Payer: Self-pay

## 2024-05-12 LAB — URINE CULTURE
Culture: >=100000 — AB
Special Requests: NORMAL

## 2024-06-23 NOTE — Progress Notes (Signed)
 [facility] [place] NEW PATIENT EVALUATION   Referring Physician:  Gladystine Erminio CROME, MD Primary Care Physician:  Erminio CROME Gladystine, MD   Patient ID:  Zoe Briggs is a 48 y.o. (DOB 03-03-1976) female.    Subjective   HPI:  Ms. Chidera Dearcos is a 48 y.o. female who presents for Botox injections due to chronic migraines.  BOTOX PREEMT PROTOCOL    Informed Consent: Signed   Procedural diagnosis: Intractable chronic migraine without aura and without status migrainosus   Number of headache days: Averages 5/30 headache days with 1/severe   Number of headache free days: 25/30   Botox effective in past: Yes. Patient has had greater than 50% reduction in migraine days after starting Botox having greater than 100 hours per month without migraine pain after starting Botox as compared to pre-Botox.   Areas Injected: Muscle                        Fixed Site/Fixed Dose                                                                                                         Corrugator:                10 Units divided in 2 sites                                                 Procerus:                   5 Units in 1 site                                               Frontalis:                    20 Units divided in 4 sites              Temporalis:                40 Units divided in 8 sites (10,10,5,5)             Occipitalis:                 30 Units divided in 6 sites (10,10,5)               Cervical PSPs:           20 Units divided in 4 sites               Trapezius:                  30 Units divided in 6 sites  Total Units used:       155  Total Units wasted:   45   Medications used:  Botulinum Toxin (Botox) Dilution: 5 units/0.1 mL (100 unit vial with 2 cc diluent  or 200 unit vial with 4 cc diluent ) Diluent : Normal Saline Indication: Intractable chronic migraine without aura  and without status migrainosus Patient Supplied: Yes   The patient tolerated the procedure well. There were no complications.    Past Medical History, Past Surgery History, Social History, and Family History were reviewed and updated.    Past Medical History:  Diagnosis Date  . Allergy   . Anxiety   . Asthma (*)   . Colon cancer (*) 2005   Rectal cancer s/p chemo and radiation  . Dental bridge present   . Dental crowns present   . Depression   . GERD (gastroesophageal reflux disease)   . Interstitial cystitis   . Leukopenia 08/26/2017  . Menopausal state   . Migraines   . Neuropathy 2022  . Pancreatitis (*) 03/28/2018  . Teeth missing     Past Surgical History:  Procedure Laterality Date  . Bunionectomy  02/2017  . Colon surgery  2006   Colosctomy for rectal cancer  . Colonoscopy  2012   [place]   . Colonoscopy  08/28/2018  . Cystoscopy w/ dilation of bladder  09/2022  . Tonsillectomy    . Upper gastrointestinal endoscopy  2000   [place]   . Upper gastrointestinal endoscopy  08/29/2018  . Urethral dilatation  09/2022   also bladder distention   Family History  Problem Relation Age of Onset  . Hypertension Mother   . Ulcers Mother   . Hyperlipidemia Mother        She also had a heart murmur and a hole in her heart  . No Known Problems Brother   . Cancer Paternal Aunt 50       brain  . Cancer Paternal Grandfather        skin cancer  . Cancer Paternal Aunt        Brain Cancer  . Diabetes Neg Hx   . Heart disease Neg Hx   . Stroke Neg Hx   . Seizures Neg Hx   . Migraines Neg Hx   . Colon cancer Neg Hx   . Colon polyps Neg Hx    Social History   Socioeconomic History  . Marital status: Married    Spouse name: Zachary  . Number of children: 1  . Years of education: 74  Occupational History  . Occupation: child psychotherapist- [employer]  Tobacco Use  . Smoking status: Never    Passive exposure: Never  . Smokeless tobacco: Never  Vaping Use  .  Vaping status: Never Used  Substance and Sexual Activity  . Alcohol use: Not Currently    Comment: 2-3 times per year  . Drug use: No  . Sexual activity: Yes    Partners: Male    Birth control/protection: None      Current Home Medications  Medication Sig  azelastine  (ASTELIN ) 0.1 % nasal spray one spray by Both Nostrils route 2 (two) times daily. Use in each nostril as directed  baclofen (LIORESAL) 10 mg tablet Take one tablet (10 mg dose) by mouth 3 (three) times a day.  botulinum toxin type A (BOTOX) 200 units injection Inject one hundred fifty five Units as directed every 3 (three) months. Provider to inject 155 units into the muscle every 3 months per PREEMPT protocol for  chronic migraine.  celecoxib (CELEBREX) 200 mg capsule Take one capsule (200 mg dose) by mouth daily as needed for Pain.  ELMIRON 100 MG capsule Take one capsule (100 mg dose) by mouth 3 (three) times a day.  estradiol -norethindrone  (COMBIPATCH) 0.05-0.14 MG/DAY one patch every 3 (three) days.  fluconazole  (DIFLUCAN ) 150 mg tablet TAKE 1 TABLET(150 MG) BY MOUTH EVERY 3 DAYS  fluticasone  propionate (FLONASE ) 50 mcg/actuation nasal spray one spray by Both Nostrils route daily.  hydrOXYzine HCl (ATARAX) 50 mg tablet Take one tablet (50 mg dose) by mouth at bedtime.  hyoscyamine-methenamine-methylene blue-phenyl salicylate-sodium phosphate  (URIBEL ) 118 mg CAPS capsule Take one capsule (118 mg dose) by mouth as needed.  levalbuterol  (XOPENEX  HFA) 45 MCG/ACT inhaler Inhale one puff to two puffs into the lungs every 4 (four) hours as needed for Wheezing or Shortness of Breath.  levocetirizine (XYZAL ) 5 mg tablet Take one tablet (5 mg dose) by mouth every evening.  meclizine HCl (ANTIVERT) 25 mg tablet TAKE 1 TABLET BY MOUTH 3 (THREE) TIMES A DAY AS NEEDED FOR DIZZINESS (VERTIGO) FOR UP TO 10 DAYS.  methylphenidate  HCl (CONCERTA ) 18 mg CR tablet Take one tablet (18 mg dose) by mouth every morning.  metoprolol tartrate  (LOPRESSOR) 25 mg tablet Take one tablet (25 mg dose) by mouth 2 (two) times daily.  Misc. Devices MISC Ostomy bag Change daily  Multiple Vitamin (MULTIVITAMIN) capsule Take one capsule by mouth daily.  Multiple Vitamins-Minerals (ONE DAILY CALCIUM/IRON) TABS Take by mouth.  naloxone (NARCAN) nasal spray (TAKE HOME PACK) as needed.  Omega-3 Fatty Acids ([employer] OMEGA-3 GUMMY FISH/DHA) 113.5 MG CHEW Chew by mouth.  Ostomy Supplies (IRRIGATION SLEEVES) MISC 1 box by Does not apply route daily.  PENNSAID 2 % SOLN   pregabalin (LYRICA) 25 mg capsule Take one capsule (25 mg dose) by mouth 3 (three) times a day.  progesterone (PROMETRIUM) 100 mg capsule Take one capsule (100 mg dose) by mouth at bedtime.  traMADol  (ULTRAM ) 50 mg tablet Take one tablet (50 mg dose) by mouth 2 (two) times a day as needed.  Ubrogepant (UBRELVY) 100 MG TABS Take one hundred mg by mouth 2 (two) times a day as needed. Ubrelvy 100 mg  Take 1 pill at onset of moderate-severe headache. May repeat after 2 hours as needed. Max 2/24h or 200 mg in 24 hours.  valacyclovir (VALTREX) 1000 mg tablet Take one tablet (1,000 mg dose) by mouth as needed.  Zavegepant HCl 10 MG/ACT SOLN 10 mg by Nasal route daily as needed.    The patient is allergic to emgality [galcanezumab-gnlm], iodine, and sumatriptan .  Review of Systems: Psychiatric: (+) No abnormal findings, Neurological: (+) No abnormal findings, Musculoskeletal: (+) No abnormal findings, Integumentary: (+) No abnormal findings, Hematologic / Lymphatic: (+) No abnormal findings, Genitourinary: (+) No abnormal findings, Gastrointestinal: (+) No abnormal findings, Endocrine: (+) No abnormal findings, Constitutional Symptoms: (+) No abnormal findings, Cardiovascular: (+) No abnormal findings.   Objective    PHYSICAL EXAM: BP (!) 103/54 (BP Location: Right Lower Arm, Patient Position: Sitting)   Pulse 77   Ht 5' 10 (1.778 m)   Wt 159 lb (72.1 kg)   LMP 07/25/2005   SpO2 98%   BMI  22.81 kg/m  GENERAL: Awake, alert, in no acute distress Psych: Affect appropriate for situation, patient is calm and cooperative with examination Head: Normocephalic and atraumatic, without obvious abnormality EENT: Normal conjunctivae, dry mucous membranes, no OP obstruction LUNGS: Normal respiratory effort. Non-labored breathing on room air. Clear to auscultation  bilaterally. CV: Auscultation: regular rate and rhythm. Extremities: Warm, well perfused, without obvious deformity  MENTAL STATUS EXAM: Orientation: Alert and oriented to person, place and time. Memory: Cooperative, follows commands well. Recent and remote memory normal.. Attention, concentration: Attention span and concentration are normal. Language: Speech is clear and language is normal. Fund of knowledge: Aware of current events, vocabulary appropriate for patient age.   CRANIAL NERVES: CN 2 (Optic): Visual fields intact to confrontation, funduscopic examination without optic disk pallor or edema, retinal vessels are normal. CN 3,4,6 (EOM): Pupils equal and reactive to light. Full extraocular eye movement without nystagmus. CN 5 (Trigeminal): Facial sensation is normal, no weakness of masticatory muscles. CN 7 (Facial): No facial weakness or asymmetry. CN 8 (Auditory): Auditory acuity grossly normal. CN 9,10 (Glossophar): The uvula is midline, the palate elevates symmetrically. CN 11 (spinal access): Normal sternocleidomastoid and trapezius strength. CN 12 (Hypoglossal): The tongue is midline. No atrophy or fasciculations.   MOTOR: Muscle Strength: Strength - 5/5 and symmetric in the upper and lower extremities, no pronation or drift. Muscle Tone: Tone and muscle bulk are normal in the upper and lower extremities.   REFLEXES:   DTRs - 2+ and symmetrical in all four extremities, plantar responses are flexor bilaterally.   COORDINATION:   Intact finger-to-nose, heel-to-shin, and rapid alternating movements, no tremor.    SENSATION:  Intact to light touch, vibration, pinprick.  No sensory extinction to DSS. Negative Romberg test.  GAIT: Routine gait normal.   Assessment   48 y.o. female with PMHx as above who presents for Botox injections due to chronic migraines.   Plan   1. Migraine with aura, not intractable, without status migrainosus  - botulinum toxin type A (BOTOX) 200 units injection; Inject two hundred Units as directed once for 1 dose. Provider to inject 200 units into the muscle every 3 months per PREEMPT protocol for chronic migraine.  Dispense: 1 each; Refill: 0   Prevention:  - Botox #5 administered today - Continue botulinum toxin type A (BOTOX) injection SOLR 200 Units Q 90 days   Do not rub the areas injected.  If there is pain, lightly apply ice to the areas.  Do not wear a hat for 12 hours after the procedure.  Do not lay flat, or go to sleep,  for four hours following the procedure.  Avoid strenuous exercise for the day following injections to decrease the risk of bruising (due to elevated blood pressure).  If you get a headache after the procedure, treat it as you would your migraine.  You may resume your regular activities after 12 hours.     Follow-up here in 3 months for repeat Botox and as needed.  -Use My Chart -Maintain headache calendar  -Discussed importance of sleep, hydration, diet and exercise in the frequency and intensity of headaches   Risks, benefits, and alternatives of the medications and treatment plan prescribed today were discussed, and patient expressed understanding and agreement with the plan.  All new prescription medications and changes in current prescription dosages were discussed with the patient, including patient education, medication name, use, dosage, potential side effects, drug interactions, consequences of not using/taking and special instructions. Patient expressed understanding. No barriers to adherence.  Follow up in about 3 months (around  09/15/2024) for Botox Injections.  Electronically Signed: Fonda FREDRIK Minus, DNP, AGNP-C Neurology Nurse Practitioner  [facility] 06/23/2024 11:09 AM  *This note was dictated with voice recognition software. Inadvertently, similar sounding words can, sometimes,  get transcribed incorrectly

## 2024-07-22 ENCOUNTER — Ambulatory Visit
Admission: RE | Admit: 2024-07-22 | Discharge: 2024-07-22 | Disposition: A | Discharge status: Home / Self Care | Source: Ambulatory Visit | Attending: Family Medicine | Admitting: Family Medicine

## 2024-07-22 ENCOUNTER — Inpatient Hospital Stay: Admission: RE | Admit: 2024-07-22 | Discharge: 2024-07-22

## 2024-07-22 DIAGNOSIS — N3090 Cystitis, unspecified without hematuria: Secondary | ICD-10-CM | POA: Insufficient documentation

## 2024-07-22 LAB — POCT URINE DIPSTICK
Bilirubin, UA: NEGATIVE
Glucose, UA: NEGATIVE mg/dL
Ketones, POC UA: NEGATIVE mg/dL
Nitrite, UA: NEGATIVE
POC PROTEIN,UA: NEGATIVE
Spec Grav, UA: 1.01 (ref 1.010–1.025)
Urobilinogen, UA: 0.2 U/dL
pH, UA: 6 (ref 5.0–8.0)

## 2024-07-22 MED ORDER — CEPHALEXIN 500 MG PO CAPS: 500.0000 mg | ORAL_CAPSULE | Freq: Two times a day (BID) | ORAL | 0 refills | Status: DC

## 2024-07-22 NOTE — ED Provider Notes (Signed)
 [facility]    ASSESSMENT & PLAN:  1. Cystitis   With h/o IC. Begin: Meds ordered this encounter  Medications   cephALEXin  (KEFLEX ) 500 MG capsule    Sig: Take 1 capsule (500 mg total) by mouth 2 (two) times daily.    Dispense:  14 capsule    Refill:  0   Urine culture sent. Will notify patient of any significant results. Ensure proper hydration. Will follow up with her PCP or here if not showing improvement over the next 48 hours, sooner if needed.  Outlined signs and symptoms indicating need for more acute intervention. Patient verbalized understanding. After Visit Summary given.  SUBJECTIVE:  Zoe Briggs is a 48 y.o. female who complains of urinary frequency and strong urinary odor; x 3 days. Denies fever/chills. Is followed by urology for IC. LMP: No LMP recorded. Patient is postmenopausal.  OBJECTIVE:  Vitals:   07/22/24 1010 07/22/24 1013  BP:  96/62  Pulse:  80  Resp:  16  Temp:  98 F (36.7 C)  TempSrc:  Oral  SpO2:  96%  Weight: 73.5 kg    General appearance: alert; no distress Psychological: alert and cooperative; normal mood and affect  Labs Reviewed  POCT URINE DIPSTICK - Abnormal; Notable for the following components:      Result Value   Color, UA light yellow (*)    Clarity, UA cloudy (*)    Blood, UA trace-intact (*)    Leukocytes, UA Small (1+) (*)    All other components within normal limits  URINE CULTURE    Allergies  Allergen Reactions   Galcanezumab-Gnlm Other (See Comments)    Pancreatitis  Other Reaction(s): other  galcanezumab-gnlm   Iodine Dermatitis    Only when given together with chemo  Other Reaction(s): Not available  iodine   Sumatriptan  Dermatitis, Other (See Comments) and Rash    Other Reaction(s): Flushing, Not available  Other Reaction(s): Not available  sumatriptan    Sulfa  Antibiotics Dermatitis and Other (See Comments)    Burns  stomach  abd pain  abd pain  abd pain  abd  pain  abd pain  abd pain  abd pain, abd pain, abd pain, abd pain  Other Reaction(s): Not available  Other Reaction(s): Unknown   Iodinated Contrast Media Rash    Only when given together with chemo    Past Medical History:  Diagnosis Date   Acute pancreatitis 08/22/2019   ADD (attention deficit  disorder)    Follows w/ [facility].   Allergy    Asthma    Seasonal, Follows w/ Erminio Sensing, PA.   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   Palpitations 03/06/2023   Sciatica  right leg   Snoring 03/06/2023   SOB (shortness of breath) 03/06/2023   Wears glasses    Social History   Socioeconomic History   Marital status: Married    Spouse name: Not on file   Number of children: 1   Years of education: Not on file   Highest education level: Not on file  Occupational History   Occupation: [employer]   Tobacco Use   Smoking status: Never   Smokeless tobacco: Never  Vaping Use   Vaping status: Never Used  Substance and Sexual Activity   Alcohol use: Not Currently   Drug use: No   Sexual activity: Yes    Partners: Male    Birth control/protection: None, Post-menopausal  Other Topics Concern   Not on file  Social History Narrative   Exercise-- 4 days a week   Social Drivers of Corporate Investment Banker Strain:  Low Risk  (02/17/2024)   Received from [facility]   Overall Financial Resource Strain (CARDIA)    Difficulty of Paying Living Expenses: Not hard at all  Food Insecurity: Low Risk  (02/24/2024)   Received from [facility]   Hunger Vital Sign    Within the past 12 months, you worried that your food would run out before you got money to buy more: Never true    Within the past 12 months, the food you bought just didn't last and you didn't have money to get more. : Never true  Transportation Needs: No Transportation Needs (02/24/2024)   Received from [facility]    In the past 12 months, has lack of reliable transportation kept you from medical appointments, meetings, work or from getting things needed for daily living? : No  Physical Activity: Insufficiently Active (02/17/2024)   Received from [facility]   Exercise Vital Sign    On average, how many days per week do you engage in moderate to strenuous exercise (like a brisk walk)?: 2 days    On average, how many minutes do you engage in exercise at this level?: 20 min  Stress: Stress Concern Present (02/17/2024)   Received from [facility] - Occupational Stress Questionnaire    Feeling of Stress : Rather much  Social Connections: Socially Integrated (02/17/2024)   Received from [facility]   Social Network    How would you rate your social network (family, work, friends)?: Good participation with social networks  Intimate Partner Violence: Not At Risk (02/17/2024)   Received from [facility]   HITS    Over the last 12 months how often did your partner physically hurt you?: Never    Over the last 12 months how often did your partner insult you or talk down to you?: Never    Over the last 12 months how often did your partner threaten you with physical harm?: Never    Over the last 12 months how often did your partner scream or curse at you?: Never   Family History  Problem  Relation Age of Onset   Arthritis Mother    Heart disease Mother        [place] in the heart   Hypertension Mother    Cancer Paternal Aunt        brain   Cancer Paternal Grandfather        skin        Rolinda Rogue, MD 07/22/24 1042

## 2024-07-22 NOTE — ED Triage Notes (Signed)
 Patient states 3 days of urinary frequency and strong odor, no fever/chills

## 2024-07-24 ENCOUNTER — Ambulatory Visit (HOSPITAL_COMMUNITY): Payer: Self-pay

## 2024-07-24 LAB — URINE CULTURE: Culture: >=100000 — AB

## 2024-08-04 ENCOUNTER — Other Ambulatory Visit: Payer: Self-pay | Admitting: Internal Medicine

## 2024-08-05 ENCOUNTER — Ambulatory Visit

## 2024-08-07 ENCOUNTER — Ambulatory Visit
Admission: RE | Admit: 2024-08-07 | Discharge: 2024-08-07 | Disposition: A | Discharge status: Home / Self Care | Source: Ambulatory Visit

## 2024-08-07 DIAGNOSIS — N3001 Acute cystitis with hematuria: Secondary | ICD-10-CM | POA: Diagnosis not present

## 2024-08-07 LAB — POCT URINE DIPSTICK
Bilirubin, UA: NEGATIVE
Glucose, UA: NEGATIVE mg/dL
Ketones, POC UA: NEGATIVE mg/dL
Nitrite, UA: POSITIVE — AB
POC PROTEIN,UA: NEGATIVE
Spec Grav, UA: <=1.005 — AB (ref 1.010–1.025)
Urobilinogen, UA: 0.2 U/dL
pH, UA: 6.5 (ref 5.0–8.0)

## 2024-08-07 MED ORDER — NITROFURANTOIN MONOHYD MACRO 100 MG PO CAPS: 100.0000 mg | ORAL_CAPSULE | Freq: Two times a day (BID) | ORAL | 0 refills | Status: AC

## 2024-08-07 MED ORDER — URIBEL 118 MG PO CAPS: 118.0000 mg | ORAL_CAPSULE | Freq: Two times a day (BID) | ORAL | 0 refills | Status: AC | PRN

## 2024-08-07 NOTE — ED Provider Notes (Signed)
 [name]    CSN: [id number] Arrival date & time: 08/07/24  1250      History   Chief Complaint Chief Complaint  Patient presents with   Dysuria    HPI Zoe Briggs is a 48 y.o. female.   Patient returns to being seen on 11/12 for urinary tract infection.  Patient states that she was prescribed Keflex  for 7 days and her symptoms returned 1 day after finishing antibiotics.  Patient has a history of interstitial cystitis and is a colon cancer survivor with a great deal of scar tissue of her bladder.  Patient states that she gets frequent UTIs and sees a urologist but is unable to get a return call from her urologist.  Patient is needing a different antibiotic and would also like a refill of her Uribel .  Patient denies fever, chills, nausea, vomiting, or back pain.  The history is provided by the patient.  Dysuria   Past Medical History:  Diagnosis Date   Acute pancreatitis 08/22/2019   ADD (attention deficit  disorder)    Follows w/ [facility].   Allergy    Asthma    Seasonal, Follows w/ Erminio Sensing, PA.   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   Urinary incontinence, mixed 07/09/2023   Other fatigue 03/06/2023   Blood pressure elevated without history of HTN 03/06/2023   Vegan diet 12/21/2022   Pain in right foot 05/08/2022   Peripheral polyneuropathy 12/04/2021   Right leg pain 12/04/2021   Early menopause 07/04/2021   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   Hand pain, right 12/18/2019   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   Acute recurrent pancreatitis 10/01/2019   Vitamin B 12 deficiency 08/10/2019   Vitamin D deficiency 08/10/2019   Asthma, intermittent 06/01/2019   Recurrent major depressive disorder, 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   Leukopenia 08/26/2017   History of creation of ostomy (HCC) 07/17/2017   Interstitial cystitis 03/16/2015   Recurrent UTI 11/04/2012   ADD (attention deficit disorder) 07/08/2012   Asthma 05/27/2012   Depression 05/27/2012   Colon cancer (HCC) 05/27/2012   Seasonal allergies 05/27/2012   History of rectal cancer 02/25/2012   Abnormal abdominal CT scan 02/25/2012    Past Surgical History:  Procedure Laterality Date   COLON SURGERY  2006   colonscopy     2012 and 08-28-2018   colonscopy  2005  CYSTOSCOPY WITH HYDRODISTENSION AND BIOPSY Bilateral 09/17/2022   Procedure: CYSTOSCOPY/BIOPSY/HYDRODISTENSION FULGURATION BILATERAL RETROGRADE PYELOGRAM;  Surgeon: Carolee Sherwood JONETTA DOUGLAS, MD;  Location: [facility];  Service: Urology;  Laterality: Bilateral;   ERCP  12/04/2019   stent to pancreas done at [place] for congenital abnormality that can cause pancreatitis   FOOT SURGERY Right 02/2017   bunionectomy   TONSILLECTOMY AND ADENOIDECTOMY     age 44 or 5   UPPER GI ENDOSCOPY  2000   UPPER GI ENDOSCOPY  08/29/2018    OB History     Gravida  2   Para  2   Term  2   Preterm      AB      Living  1      SAB      IAB      Ectopic       Multiple      Live Births  1            Home Medications    Prior to Admission medications   Medication Sig Start Date End Date Taking? Authorizing Provider  acetaminophen  (TYLENOL ) 325 MG tablet Take 650 mg by mouth every 6 (six) hours as needed for mild pain or headache.   Yes [provider]  baclofen (LIORESAL) 10 MG tablet Take 10 mg by mouth 3 (three) times daily.   Yes [provider]  buPROPion (WELLBUTRIN XL) 150 MG 24 hr tablet Take 150 mg by mouth.   Yes [provider]  celecoxib (CELEBREX) 200 MG capsule Take 200 mg by mouth 2 (two) times daily as needed. 01/15/24  Yes [provider]  Cholecalciferol 50 MCG (2000 UT) CAPS Take by mouth.   Yes [provider]  estradiol -norethindrone  (COMBIPATCH) 0.05-0.14 MG/DAY Place 1 patch onto the skin 2 (two) times a week.   Yes [provider]  eszopiclone (LUNESTA) 1 MG TABS tablet TAKE 1 TABLET BY MOUTH EVERY DAY AT BEDIME.   Yes [provider]  fluticasone  (FLONASE ) 50 MCG/ACT nasal spray INSTILL 1 SPRAY IN EACH NOSTRIL DAILY.   Yes [provider]  heparin 10000 UNIT/ML injection INSTILL 4 ML (40,000 UNITS) INTO THE BLADDER DAILY AS NEEDED   Yes [provider]  hydroquinone 4 % cream Apply topically 2 (two) times daily.   Yes [provider]  hydrOXYzine (ATARAX) 50 MG tablet Take 50 mg by mouth at bedtime.   Yes [provider]  ibuprofen (ADVIL) 200 MG tablet Take 200 mg by mouth every 6 (six) hours as needed.   Yes [provider]  levalbuterol  (XOPENEX  HFA) 45 MCG/ACT inhaler Inhale 2 puffs into the lungs every 8 (eight) hours as needed for wheezing or shortness of breath. 12/20/23  Yes Tobie Arleta SQUIBB, MD  levocetirizine (XYZAL ) 5 MG tablet Take 1 tablet (5 mg total) by mouth every evening. 12/20/23  Yes Tobie Arleta SQUIBB, MD  meloxicam (MOBIC) 7.5 MG tablet Take 7.5 mg by mouth daily.   Yes [provider]   methenamine (HIPREX) 1 g tablet Take 1 g by mouth. 02/24/24 02/23/25 Yes [provider]  methylphenidate  18 MG PO CR tablet TAKE 1 TABLET BY MOUTH EVERY DAY IN THE MORNING   Yes [provider]  metoprolol tartrate (LOPRESSOR) 25 MG tablet Take by mouth.   Yes [provider]  montelukast  (SINGULAIR ) 10 MG tablet TAKE 1 TABLET BY MOUTH EVERYDAY AT BEDTIME   Yes [provider]  Multiple  Vitamin (MULTIVITAMIN) tablet Take 1 tablet by mouth daily.   Yes [provider]  nitrofurantoin , macrocrystal-monohydrate, (MACROBID ) 100 MG capsule Take 1 capsule (100 mg total) by mouth 2 (two) times daily. 08/07/24  Yes Andra Krabbe C, PA-C  Omega-3 Fatty Acids (OMEGA 3 PO) Take by mouth 2 (two) times daily with breakfast and lunch. 2 tabs bid plant based   Yes [provider]  orphenadrine (NORFLEX) 100 MG tablet Take 100 mg by mouth as directed.   Yes [provider]  Pregabalin (LYRICA PO) Take 25 mg by mouth as needed.   Yes [provider]  progesterone (PROMETRIUM) 100 MG capsule Take 100 mg by mouth at bedtime.   Yes [provider]  traMADol  (ULTRAM ) 50 MG tablet Take 50 mg by mouth 2 (two) times daily.   Yes [provider]  valACYclovir (VALTREX) 1000 MG tablet Take by mouth. 02/18/24  Yes [provider]  Zavegepant HCl (ZAVZPRET) 10 MG/ACT SOLN SPRAY 1 SPRAY INTO THE NOSTRILS DAILY AS NEEDED   Yes [provider]  botulinum toxin Type A (BOTOX) 200 units injection Inject 155 Units into the muscle as directed. 06/23/24   [provider]  Fremanezumab-vfrm (AJOVY) 225 MG/1.5ML SOAJ INJECT 1 AUTOINJECTOR AS DIRECETD 05/23/20   [provider]  Meth-Hyo-M Bl-Na Phos-Ph Sal (URIBEL ) 118 MG CAPS Take 1 capsule (118 mg total) by mouth 2 (two) times daily as needed. 08/07/24   Andra Krabbe BROCKS, PA-C  Na Sulfate-K Sulfate-Mg Sulfate concentrate (SUPREP) 17.5-3.13-1.6 GM/177ML  SOLN Take 177 mLs by mouth. 07/23/24   [provider]  naloxone (NARCAN) nasal spray 4 mg/0.1 mL SMARTSIG:Both Nares    [provider]  nortriptyline  (PAMELOR ) 10 MG capsule Take 10 mg by mouth at bedtime.    [provider]  Sertraline  HCl (ZOLOFT  PO)     [provider]  UNABLE TO FIND Testesterone pellet every 3 months    [provider]    Family History Family History  Problem Relation Age of Onset   Arthritis Mother    Heart disease Mother        [place] in the heart   Hypertension Mother    Cancer Paternal Aunt        brain   Cancer Paternal Grandfather        skin    Social History Social History   Tobacco Use   Smoking status: Never   Smokeless tobacco: Never  Vaping Use   Vaping status: Never Used  Substance Use Topics   Alcohol use: Not Currently   Drug use: No     Allergies   Galcanezumab-gnlm, Iodine, Sumatriptan , Sulfa  antibiotics, and Iodinated contrast media   Review of Systems Review of Systems  Genitourinary:  Positive for dysuria.     Physical Exam Triage Vital Signs ED Triage Vitals  Encounter Vitals Group     BP 08/07/24 1307 122/78     Girls Systolic BP Percentile --      Girls Diastolic BP Percentile --      Boys Systolic BP Percentile --      Boys Diastolic BP Percentile --      Pulse Rate 08/07/24 1307 78     Resp 08/07/24 1307 18     Temp 08/07/24 1307 97.9 F (36.6 C)     Temp Source 08/07/24 1307 Oral     SpO2 08/07/24 1307 98 %     Weight --      Height --  Head Circumference --      Peak Flow --      Pain Score 08/07/24 1312 0     Pain Loc --      Pain Education --      Exclude from Growth Chart --    No data found.  Updated Vital Signs BP 122/78 (BP Location: Left Arm)   Pulse 78   Temp 97.9 F (36.6 C) (Oral)   Resp 18   SpO2 98%   Visual Acuity Right Eye Distance:   Left Eye Distance:   Bilateral Distance:    Right Eye Near:   Left Eye Near:     Bilateral Near:     Physical Exam Vitals and nursing note reviewed.  Constitutional:      General: She is not in acute distress.    Appearance: Normal appearance. She is not ill-appearing, toxic-appearing or diaphoretic.  Eyes:     General: No scleral icterus. Cardiovascular:     Rate and Rhythm: Normal rate and regular rhythm.     Heart sounds: Normal heart sounds.  Pulmonary:     Effort: Pulmonary effort is normal. No respiratory distress.     Breath sounds: Normal breath sounds. No wheezing or rhonchi.  Abdominal:     General: Abdomen is flat. Bowel sounds are normal.     Palpations: Abdomen is soft.     Tenderness: There is abdominal tenderness in the suprapubic area. There is no right CVA tenderness or left CVA tenderness.  Skin:    General: Skin is warm.  Neurological:     Mental Status: She is alert and oriented to person, place, and time.  Psychiatric:        Mood and Affect: Mood normal.        Behavior: Behavior normal.      UC Treatments / Results  Labs (all labs ordered are listed, but only abnormal results are displayed) Labs Reviewed  POCT URINE DIPSTICK - Abnormal; Notable for the following components:      Result Value   Clarity, UA hazy (*)    Spec Grav, UA <=1.005 (*)    Blood, UA trace-intact (*)    Nitrite, UA Positive (*)    Leukocytes, UA Large (3+) (*)    All other components within normal limits  URINE CULTURE    EKG   Radiology No results found.  Procedures Procedures (including critical care time)  Medications Ordered in UC Medications - No data to display  Initial Impression / Assessment and Plan / UC Course  I have reviewed the triage vital signs and the nursing notes.  Pertinent labs & imaging results that were available during my care of the patient were reviewed by me and considered in my medical decision making (see chart for details).    Final Clinical Impressions(s) / UC Diagnoses   Final diagnoses:  Acute cystitis  with hematuria   Discharge Instructions   None    ED Prescriptions     Medication Sig Dispense Auth. Provider   nitrofurantoin , macrocrystal-monohydrate, (MACROBID ) 100 MG capsule Take 1 capsule (100 mg total) by mouth 2 (two) times daily. 10 capsule Andra Krabbe C, PA-C   Meth-Hyo-M Bl-Na Phos-Ph Sal (URIBEL ) 118 MG CAPS Take 1 capsule (118 mg total) by mouth 2 (two) times daily as needed. 60 capsule Andra Krabbe BROCKS, PA-C      PDMP not reviewed this encounter.   Andra Krabbe BROCKS, PA-C 08/07/24 1434

## 2024-08-07 NOTE — ED Triage Notes (Signed)
 PT states urinary frequency is not better after taking her medication as prescribed.

## 2024-08-09 LAB — URINE CULTURE: Culture: >=100000 — AB

## 2024-08-10 ENCOUNTER — Ambulatory Visit (HOSPITAL_COMMUNITY): Payer: Self-pay

## 2024-10-14 ENCOUNTER — Inpatient Hospital Stay: Admission: RE | Admit: 2024-10-14 | Discharge: 2024-10-14 | Payer: Self-pay

## 2024-10-14 VITALS — BP 112/75 | HR 79 | Temp 98.1°F | Resp 16

## 2024-10-14 DIAGNOSIS — N3001 Acute cystitis with hematuria: Secondary | ICD-10-CM | POA: Diagnosis not present

## 2024-10-14 LAB — POCT URINE DIPSTICK
Bilirubin, UA: NEGATIVE
Glucose, UA: NEGATIVE mg/dL
Ketones, POC UA: NEGATIVE mg/dL
Nitrite, UA: POSITIVE — AB
POC PROTEIN,UA: NEGATIVE
Spec Grav, UA: 1.02
Urobilinogen, UA: 0.2 U/dL
pH, UA: 6.5

## 2024-10-14 MED ORDER — CIPROFLOXACIN HCL 250 MG PO TABS: 250.0000 mg | ORAL_TABLET | Freq: Two times a day (BID) | ORAL | 0 refills | Status: AC

## 2024-10-14 MED ORDER — PHENAZOPYRIDINE HCL 200 MG PO TABS: 200.0000 mg | ORAL_TABLET | Freq: Three times a day (TID) | ORAL | 0 refills | Status: AC

## 2024-10-14 NOTE — ED Triage Notes (Signed)
 Patient presents for urine frequency , burning, and odor x 1 week.  Patient has IC and does not know if its that or a UTI.  Patient has has taken ibphofen and prescribed medication for it.

## 2024-10-14 NOTE — ED Provider Notes (Signed)
 " [facility]  Note:  This document was prepared using Dragon voice recognition software and may include unintentional dictation errors.  MRN: [id number] DOB: August 20, 1976  Subjective:   Zoe Briggs is a 49 y.o. female presenting for evaluation of urinary frequency, dysuria, increased urinary odor x 1 week.  Patient has known history of interstitial cystitis but wants to make sure that there is no current acute UTI.  Patient has taken ibuprofen and previously prescribed medication for IC with minimal improvement.  Patient denies any fever, severe abdominal pain, flank pain, weakness, dizziness.  Current Medications[1]   Allergies[2]  Past Medical History:  Diagnosis Date   Acute pancreatitis 08/22/2019   ADD (attention deficit  disorder)    Follows w/ [facility].   Allergy    Asthma    Seasonal, Follows w/ Erminio Sensing, PA.   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   Palpitations 03/06/2023   Sciatica    right leg   Snoring 03/06/2023   SOB (shortness of breath) 03/06/2023   Wears glasses      Past Surgical History:  Procedure Laterality Date   COLON SURGERY  2006   colonscopy     2012 and 08-28-2018   colonscopy  2005   CYSTOSCOPY WITH HYDRODISTENSION AND BIOPSY Bilateral 09/17/2022   Procedure:  CYSTOSCOPY/BIOPSY/HYDRODISTENSION FULGURATION BILATERAL RETROGRADE PYELOGRAM;  Surgeon: Carolee Sherwood JONETTA DOUGLAS, MD;  Location: [facility];  Service: Urology;  Laterality: Bilateral;   ERCP  12/04/2019   stent to pancreas done at [facility] for congenital abnormality that can cause pancreatitis   FOOT SURGERY Right 02/2017   bunionectomy   TONSILLECTOMY AND ADENOIDECTOMY     age 21 or 5   UPPER GI ENDOSCOPY  2000   UPPER GI ENDOSCOPY  08/29/2018    Family History  Problem Relation Age of Onset   Arthritis Mother    Heart disease Mother        Hole in the heart   Hypertension Mother    Cancer Paternal Aunt        brain   Cancer Paternal Grandfather        skin    Social History[3]  ROS Refer to HPI for ROS details.  Objective:   Vitals: BP 112/75 (BP Location: Left Arm)   Pulse 79   Temp 98.1 F (36.7 C) (Oral)   Resp 16   SpO2 97%   Physical Exam Vitals and nursing note reviewed.  Constitutional:      General: She is not in acute distress.    Appearance: Normal appearance. She is well-developed. She is not ill-appearing or toxic-appearing.  HENT:     Head: Normocephalic and atraumatic.  Cardiovascular:     Rate and Rhythm: Normal rate.  Pulmonary:     Effort: Pulmonary effort is normal. No respiratory distress.  Abdominal:     General: Bowel sounds are normal. There is no distension.     Palpations: Abdomen is soft.     Tenderness: There is abdominal tenderness. There is no right CVA tenderness or left CVA tenderness.  Skin:    General: Skin is warm and dry.  Neurological:     General: No focal deficit present.     Mental Status: She is alert and oriented to person, place, and time.  Psychiatric:        Mood and Affect: Mood normal.        Behavior: Behavior normal.     Procedures  Results for orders placed or performed during the hospital encounter of 10/14/24 (from the past 24 hours)  POCT URINE DIPSTICK     Status: Abnormal    Collection Time: 10/14/24 11:13 AM  Result Value Ref Range   Color, UA green (A) yellow   Clarity, UA clear clear   Glucose, UA negative negative mg/dL   Bilirubin, UA negative negative   Ketones, POC UA negative negative mg/dL   Spec Grav, UA 8.979 8.989 - 1.025   Blood, UA small (A) negative   pH, UA 6.5 5.0 - 8.0   POC PROTEIN,UA negative negative, trace   Urobilinogen, UA 0.2 0.2 or 1.0 E.U./dL   Nitrite, UA Positive (A) Negative   Leukocytes, UA Large (3+) (A) Negative    No results found.   Assessment and Plan :     Discharge Instructions       1. Acute cystitis with hematuria (Primary) - POCT URINE DIPSTICK completed in UC shows positive leukocytes, positive nitrite, small blood, these findings are indicative of urinary tract infection. - Urine Culture collected in UC and sent to lab for further testing results should be available in 2 to 3 days.  If there are any abnormal findings with urine culture patient we contacted appropriate treatment provided. - ciprofloxacin  (CIPRO ) 250 MG tablet; Take 1 tablet (250 mg total) by mouth every 12 (twelve) hours.  Dispense: 10 tablet; Refill: 0 - phenazopyridine  (PYRIDIUM ) 200 MG tablet; Take 1 tablet (200 mg total) by mouth 3 (three) times daily.  Dispense: 6 tablet; Refill: 0  -Continue to monitor symptoms for any change in severity if there is any escalation of current symptoms or development of new symptoms follow-up in ER for further evaluation and management.       Zoe Briggs B Azlan Hanway    [1] No current facility-administered medications for this encounter.  Current Outpatient Medications:    ciprofloxacin  (CIPRO ) 250 MG tablet, Take 1 tablet (250 mg total) by mouth every 12 (twelve) hours., Disp: 10 tablet, Rfl: 0   phenazopyridine  (PYRIDIUM ) 200 MG tablet, Take 1 tablet (200 mg total) by mouth 3 (three) times daily., Disp: 6 tablet, Rfl: 0   acetaminophen  (TYLENOL ) 325 MG tablet, Take 650 mg by mouth every 6 (six)  hours as needed for mild pain or headache., Disp: , Rfl:    baclofen (LIORESAL) 10 MG tablet, Take 10 mg by mouth 3 (three) times daily., Disp: , Rfl:    botulinum toxin Type A (BOTOX) 200 units injection, Inject 155 Units into the muscle as directed., Disp: , Rfl:    buPROPion (WELLBUTRIN XL) 150 MG 24 hr tablet, Take 150 mg by mouth., Disp: , Rfl:    celecoxib (CELEBREX) 200 MG  capsule, Take 200 mg by mouth 2 (two) times daily as needed., Disp: , Rfl:    Cholecalciferol 50 MCG (2000 UT) CAPS, Take by mouth., Disp: , Rfl:    estradiol -norethindrone  (COMBIPATCH) 0.05-0.14 MG/DAY, Place 1 patch onto the skin 2 (two) times a week., Disp: , Rfl:    eszopiclone (LUNESTA) 1 MG TABS tablet, TAKE 1 TABLET BY MOUTH EVERY DAY AT BEDIME., Disp: , Rfl:    fluticasone  (FLONASE ) 50 MCG/ACT nasal spray, INSTILL 1 SPRAY IN EACH NOSTRIL DAILY., Disp: , Rfl:    Fremanezumab-vfrm (AJOVY) 225 MG/1.5ML SOAJ, INJECT 1 AUTOINJECTOR AS DIRECETD, Disp: , Rfl:    heparin 10000 UNIT/ML injection, INSTILL 4 ML (40,000 UNITS) INTO THE BLADDER DAILY AS NEEDED, Disp: , Rfl:    hydroquinone 4 % cream, Apply topically 2 (two) times daily., Disp: , Rfl:    hydrOXYzine (ATARAX) 50 MG tablet, Take 50 mg by mouth at bedtime., Disp: , Rfl:    ibuprofen (ADVIL) 200 MG tablet, Take 200 mg by mouth every 6 (six) hours as needed., Disp: , Rfl:    levalbuterol  (XOPENEX  HFA) 45 MCG/ACT inhaler, Inhale 2 puffs into the lungs every 8 (eight) hours as needed for wheezing or shortness of breath., Disp: 1 each, Rfl: 1   levocetirizine (XYZAL ) 5 MG tablet, Take 1 tablet (5 mg total) by mouth every evening., Disp: 30 tablet, Rfl: 5   meloxicam (MOBIC) 7.5 MG tablet, Take 7.5 mg by mouth daily., Disp: , Rfl:    Meth-Hyo-M Bl-Na Phos-Ph Sal (URIBEL ) 118 MG CAPS, Take 1 capsule (118 mg total) by mouth 2 (two) times daily as needed., Disp: 60 capsule, Rfl: 0   methenamine (HIPREX) 1 g tablet, Take 1 g by mouth., Disp: , Rfl:    methylphenidate  18 MG  PO CR tablet, TAKE 1 TABLET BY MOUTH EVERY DAY IN THE MORNING, Disp: , Rfl:    metoprolol tartrate (LOPRESSOR) 25 MG tablet, Take by mouth., Disp: , Rfl:    montelukast  (SINGULAIR ) 10 MG tablet, TAKE 1 TABLET BY MOUTH EVERYDAY AT BEDTIME, Disp: , Rfl:    Multiple Vitamin (MULTIVITAMIN) tablet, Take 1 tablet by mouth daily., Disp: , Rfl:    Na Sulfate-K Sulfate-Mg Sulfate concentrate (SUPREP) 17.5-3.13-1.6 GM/177ML SOLN, Take 177 mLs by mouth., Disp: , Rfl:    naloxone (NARCAN) nasal spray 4 mg/0.1 mL, SMARTSIG:Both Nares, Disp: , Rfl:    nitrofurantoin , macrocrystal-monohydrate, (MACROBID ) 100 MG capsule, Take 1 capsule (100 mg total) by mouth 2 (two) times daily., Disp: 10 capsule, Rfl: 0   nortriptyline  (PAMELOR ) 10 MG capsule, Take 10 mg by mouth at bedtime., Disp: , Rfl:    Omega-3 Fatty Acids (OMEGA 3 PO), Take by mouth 2 (two) times daily with breakfast and lunch. 2 tabs bid plant based, Disp: , Rfl:    orphenadrine (NORFLEX) 100 MG tablet, Take 100 mg by mouth as directed., Disp: , Rfl:    Pregabalin (LYRICA PO), Take 25 mg by mouth as needed., Disp: , Rfl:    progesterone (PROMETRIUM) 100 MG capsule, Take 100 mg by mouth at bedtime., Disp: , Rfl:    Sertraline  HCl (ZOLOFT  PO), , Disp: , Rfl:    traMADol  (ULTRAM ) 50 MG tablet, Take 50 mg by mouth 2 (two) times daily., Disp: , Rfl:    UNABLE TO FIND, Testesterone pellet every 3 months, Disp: , Rfl:    valACYclovir (VALTREX) 1000 MG tablet, Take by mouth., Disp: , Rfl:    Zavegepant HCl (ZAVZPRET) 10 MG/ACT SOLN, SPRAY 1  SPRAY INTO THE NOSTRILS DAILY AS NEEDED, Disp: , Rfl:  [2]  Allergies Allergen Reactions   Galcanezumab-Gnlm Other (See Comments)    Pancreatitis  Other Reaction(s): other  galcanezumab-gnlm   Iodine Dermatitis    Only when given together with chemo  Other Reaction(s): Not available  iodine   Sumatriptan  Dermatitis, Other (See Comments) and Rash    Other Reaction(s): Flushing, Not available  Other Reaction(s):  Not available  sumatriptan   sumatriptan  succinate   Sulfa  Antibiotics Dermatitis and Other (See Comments)    Burns  stomach  abd pain  abd pain  abd pain  abd pain  abd pain  abd pain  abd pain, abd pain, abd pain, abd pain  Other Reaction(s): Not available  Other Reaction(s): Unknown   Iodinated Contrast Media Rash    Only when given together with chemo  [3]  Social History Tobacco Use   Smoking status: Never   Smokeless tobacco: Never  Vaping Use   Vaping status: Never Used  Substance Use Topics   Alcohol use: Not Currently   Drug use: No     Aurea Goodell B, NP 10/14/24 1127  "

## 2024-10-14 NOTE — Discharge Instructions (Signed)
" °  1. Acute cystitis with hematuria (Primary) - POCT URINE DIPSTICK completed in UC shows positive leukocytes, positive nitrite, small blood, these findings are indicative of urinary tract infection. - Urine Culture collected in UC and sent to lab for further testing results should be available in 2 to 3 days.  If there are any abnormal findings with urine culture patient we contacted appropriate treatment provided. - ciprofloxacin  (CIPRO ) 250 MG tablet; Take 1 tablet (250 mg total) by mouth every 12 (twelve) hours.  Dispense: 10 tablet; Refill: 0 - phenazopyridine  (PYRIDIUM ) 200 MG tablet; Take 1 tablet (200 mg total) by mouth 3 (three) times daily.  Dispense: 6 tablet; Refill: 0  -Continue to monitor symptoms for any change in severity if there is any escalation of current symptoms or development of new symptoms follow-up in ER for further evaluation and management. "

## 2024-10-16 ENCOUNTER — Ambulatory Visit (HOSPITAL_COMMUNITY): Payer: Self-pay

## 2024-10-16 LAB — URINE CULTURE
Culture: >=100000 — AB
Special Requests: NORMAL
